# Patient Record
Sex: Male | Born: 1965
Health system: Southern US, Community
[De-identification: ages and names within clinical notes are randomized; demographics above are authoritative.]

## PROBLEM LIST (undated history)

## (undated) DIAGNOSIS — M25569 Pain in unspecified knee: Secondary | ICD-10-CM

## (undated) DIAGNOSIS — M25512 Pain in left shoulder: Secondary | ICD-10-CM

## (undated) DIAGNOSIS — G8929 Other chronic pain: Secondary | ICD-10-CM

## (undated) DIAGNOSIS — K219 Gastro-esophageal reflux disease without esophagitis: Secondary | ICD-10-CM

## (undated) DIAGNOSIS — E669 Obesity, unspecified: Secondary | ICD-10-CM

## (undated) HISTORY — DX: Obesity, unspecified: E66.9

## (undated) HISTORY — PX: OTHER SURGICAL HISTORY: SHX169

## (undated) HISTORY — DX: Gastro-esophageal reflux disease without esophagitis: K21.9

## (undated) HISTORY — PX: KNEE SURGERY: SHX244

---

## 1999-08-01 ENCOUNTER — Emergency Department (HOSPITAL_COMMUNITY): Admission: EM | Admit: 1999-08-01 | Discharge: 1999-08-02 | Payer: Self-pay | Admitting: *Deleted

## 2001-07-11 HISTORY — PX: CERVICAL DISC SURGERY: SHX588

## 2002-01-20 ENCOUNTER — Emergency Department (HOSPITAL_COMMUNITY): Admission: EM | Admit: 2002-01-20 | Discharge: 2002-01-20 | Payer: Self-pay | Admitting: Emergency Medicine

## 2002-01-23 ENCOUNTER — Ambulatory Visit (HOSPITAL_COMMUNITY): Admission: RE | Admit: 2002-01-23 | Discharge: 2002-01-23 | Payer: Self-pay | Admitting: Family Medicine

## 2002-01-23 ENCOUNTER — Encounter: Payer: Self-pay | Admitting: Family Medicine

## 2002-03-06 ENCOUNTER — Encounter: Payer: Self-pay | Admitting: Neurosurgery

## 2002-03-06 ENCOUNTER — Inpatient Hospital Stay (HOSPITAL_COMMUNITY): Admission: RE | Admit: 2002-03-06 | Discharge: 2002-03-07 | Payer: Self-pay | Admitting: Neurosurgery

## 2003-01-24 ENCOUNTER — Emergency Department (HOSPITAL_COMMUNITY): Admission: EM | Admit: 2003-01-24 | Discharge: 2003-01-25 | Payer: Self-pay | Admitting: Emergency Medicine

## 2003-01-27 ENCOUNTER — Ambulatory Visit (HOSPITAL_COMMUNITY): Admission: RE | Admit: 2003-01-27 | Discharge: 2003-01-27 | Payer: Self-pay | Admitting: *Deleted

## 2003-02-26 ENCOUNTER — Ambulatory Visit (HOSPITAL_COMMUNITY): Admission: RE | Admit: 2003-02-26 | Discharge: 2003-02-26 | Payer: Self-pay | Admitting: Family Medicine

## 2003-02-26 ENCOUNTER — Encounter: Payer: Self-pay | Admitting: Family Medicine

## 2003-05-01 ENCOUNTER — Ambulatory Visit (HOSPITAL_COMMUNITY): Admission: RE | Admit: 2003-05-01 | Discharge: 2003-05-01 | Payer: Self-pay | Admitting: Gastroenterology

## 2006-11-28 ENCOUNTER — Ambulatory Visit: Payer: Self-pay | Admitting: Family Medicine

## 2008-07-24 ENCOUNTER — Ambulatory Visit: Payer: Self-pay | Admitting: Internal Medicine

## 2008-07-24 DIAGNOSIS — Z87448 Personal history of other diseases of urinary system: Secondary | ICD-10-CM | POA: Insufficient documentation

## 2008-07-24 DIAGNOSIS — E669 Obesity, unspecified: Secondary | ICD-10-CM | POA: Insufficient documentation

## 2008-07-24 DIAGNOSIS — K219 Gastro-esophageal reflux disease without esophagitis: Secondary | ICD-10-CM | POA: Insufficient documentation

## 2008-07-24 LAB — CONVERTED CEMR LAB
Bilirubin Urine: NEGATIVE
Blood in Urine, dipstick: NEGATIVE
Glucose, Urine, Semiquant: NEGATIVE
Ketones, urine, test strip: NEGATIVE
Nitrite: NEGATIVE
Protein, U semiquant: NEGATIVE
Specific Gravity, Urine: >=1.03
Urobilinogen, UA: NEGATIVE
WBC Urine, dipstick: NEGATIVE
pH: 5

## 2010-04-12 ENCOUNTER — Encounter: Payer: Self-pay | Admitting: Internal Medicine

## 2010-04-26 ENCOUNTER — Ambulatory Visit: Payer: Self-pay | Admitting: Internal Medicine

## 2010-04-26 LAB — CONVERTED CEMR LAB
ALT: 27 units/L (ref 0–53)
AST: 21 units/L (ref 0–37)
Albumin: 4 g/dL (ref 3.5–5.2)
Alkaline Phosphatase: 58 units/L (ref 39–117)
BUN: 13 mg/dL (ref 6–23)
Basophils Absolute: 0 10*3/uL (ref 0.0–0.1)
Basophils Relative: 0.6 % (ref 0.0–3.0)
Bilirubin Urine: NEGATIVE
Bilirubin, Direct: 0.1 mg/dL (ref 0.0–0.3)
Blood in Urine, dipstick: NEGATIVE
CO2: 29 meq/L (ref 19–32)
Calcium: 9 mg/dL (ref 8.4–10.5)
Chloride: 105 meq/L (ref 96–112)
Cholesterol: 157 mg/dL (ref 0–200)
Creatinine, Ser: 0.9 mg/dL (ref 0.4–1.5)
Eosinophils Absolute: 0.1 10*3/uL (ref 0.0–0.7)
Eosinophils Relative: 1 % (ref 0.0–5.0)
GFR calc non Af Amer: 104.24 mL/min (ref >60)
Glucose, Bld: 85 mg/dL (ref 70–99)
Glucose, Urine, Semiquant: NEGATIVE
HCT: 41.9 % (ref 39.0–52.0)
HDL: 31 mg/dL — ABNORMAL LOW (ref >39.00)
Hemoglobin: 14.2 g/dL (ref 13.0–17.0)
Ketones, urine, test strip: NEGATIVE
LDL Cholesterol: 106 mg/dL — ABNORMAL HIGH (ref 0–99)
Lymphocytes Relative: 33.5 % (ref 12.0–46.0)
Lymphs Abs: 2.3 10*3/uL (ref 0.7–4.0)
MCHC: 33.9 g/dL (ref 30.0–36.0)
MCV: 87.3 fL (ref 78.0–100.0)
Monocytes Absolute: 0.6 10*3/uL (ref 0.1–1.0)
Monocytes Relative: 8.4 % (ref 3.0–12.0)
Neutro Abs: 4 10*3/uL (ref 1.4–7.7)
Neutrophils Relative %: 56.5 % (ref 43.0–77.0)
Nitrite: NEGATIVE
Platelets: 159 10*3/uL (ref 150.0–400.0)
Potassium: 4 meq/L (ref 3.5–5.1)
Protein, U semiquant: NEGATIVE
RBC: 4.8 M/uL (ref 4.22–5.81)
RDW: 14.3 % (ref 11.5–14.6)
Sodium: 140 meq/L (ref 135–145)
Specific Gravity, Urine: 1.02
TSH: 1.15 microintl units/mL (ref 0.35–5.50)
Total Bilirubin: 1 mg/dL (ref 0.3–1.2)
Total CHOL/HDL Ratio: 5
Total Protein: 6.8 g/dL (ref 6.0–8.3)
Triglycerides: 98 mg/dL (ref 0.0–149.0)
Urobilinogen, UA: 0.2
VLDL: 19.6 mg/dL (ref 0.0–40.0)
WBC Urine, dipstick: NEGATIVE
WBC: 7 10*3/uL (ref 4.5–10.5)
pH: 5.5

## 2010-05-10 ENCOUNTER — Ambulatory Visit: Payer: Self-pay | Admitting: Internal Medicine

## 2010-07-14 ENCOUNTER — Telehealth (INDEPENDENT_AMBULATORY_CARE_PROVIDER_SITE_OTHER): Payer: Self-pay | Admitting: *Deleted

## 2010-08-10 NOTE — Assessment & Plan Note (Signed)
Summary: CPX/CJR   Vital Signs:  Patient profile:   45 year old Shane [name] Height:      69 inches Weight:      275 pounds BMI:     40.76 Temp:     98.2 degrees F oral BP sitting:   134 / 76  (left arm) Cuff size:   large  Vitals Entered By: Alfred Levins, CMA (May 10, 2010 1:58 PM)  Contraindications/Deferment of Procedures/Staging:    Test/Procedure: FLU VAX    Reason for deferment: patient declined  CC: cpx   CC:  cpx.  History of Present Illness: 45 year old patient who is seen today for a wellness exam.  He is accompanied by his wife.  She describes him as a mild to moderate snore.  He denies any daytime sleepiness.  He does have a history of exogenous obesity  Current Medications (verified): 1)  None  Allergies (verified): No Known Drug Allergies  Past History:  Past Medical History: GERD history of hematuria exogenous obesity  Past Surgical History: cervical disk disease, status post surgery 2003 (nudelman) status post right carpal pummel repair  Family History: Reviewed history from 07/24/2008 and no changes required.  father, age 25.  History prostate cancer, and hypertension mother is 31, status post resection of a benign brain tumor status post CABG and status post aortic and mitral valve repair secondary to rheumatic heart disease one brother is in good health with a history of nephrolithiasis two sisters one died at childbirth  Social History: Reviewed history from 07/24/2008 and no changes required. Never Smoked 3 children Married  Review of Systems  The patient denies anorexia, fever, weight loss, weight gain, vision loss, decreased hearing, hoarseness, chest pain, syncope, dyspnea on exertion, peripheral edema, prolonged cough, headaches, hemoptysis, abdominal pain, melena, hematochezia, severe indigestion/heartburn, hematuria, incontinence, genital sores, muscle weakness, suspicious skin lesions, transient blindness, difficulty walking,  depression, unusual weight change, abnormal bleeding, enlarged lymph nodes, angioedema, breast masses, and testicular masses.    Physical Exam  General:  overweight-appearing.  130/76overweight-appearing.   Head:  Normocephalic and atraumatic without obvious abnormalities. No apparent alopecia or balding. Eyes:  No corneal or conjunctival inflammation noted. EOMI. Perrla. Funduscopic exam benign, without hemorrhages, exudates or papilledema. Vision grossly normal. Ears:  External ear exam shows no significant lesions or deformities.  Otoscopic examination reveals clear canals, tympanic membranes are intact bilaterally without bulging, retraction, inflammation or discharge. Hearing is grossly normal bilaterally. Nose:  External nasal examination shows no deformity or inflammation. Nasal mucosa are pink and moist without lesions or exudates. Mouth:  pharyngeal crowding and low-lying uvula.   Neck:  No deformities, masses, or tenderness noted. Chest Wall:  No deformities, masses, tenderness or gynecomastia noted. Breasts:  No masses or gynecomastia noted Lungs:  Normal respiratory effort, chest expands symmetrically. Lungs are clear to auscultation, no crackles or wheezes. Heart:  Normal rate and regular rhythm. S1 and S2 normal without gallop, murmur, click, rub or other extra sounds. Abdomen:  Bowel sounds positive,abdomen soft and non-tender without masses, organomegaly or hernias noted. Rectal:  No external abnormalities noted. Normal sphincter tone. No rectal masses or tenderness. Genitalia:  Testes bilaterally descended without nodularity, tenderness or masses. No scrotal masses or lesions. No penis lesions or urethral discharge. Prostate:  Prostate gland firm and smooth, no enlargement, nodularity, tenderness, mass, asymmetry or induration. Msk:  No deformity or scoliosis noted of thoracic or lumbar spine.   Pulses:  R and L carotid,radial,femoral,dorsalis pedis and posterior tibial pulses  are  full and equal bilaterally Extremities:  No clubbing, cyanosis, edema, or deformity noted with normal full range of motion of all joints.   Neurologic:  No cranial nerve deficits noted. Station and gait are normal. Plantar reflexes are down-going bilaterally. DTRs are symmetrical throughout. Sensory, motor and coordinative functions appear intact. Skin:  Intact without suspicious lesions or rashes Cervical Nodes:  No lymphadenopathy noted Axillary Nodes:  No palpable lymphadenopathy Inguinal Nodes:  No significant adenopathy Psych:  Cognition and judgment appear intact. Alert and cooperative with normal attention span and concentration. No apparent delusions, illusions, hallucinations   Impression & Recommendations:  Problem # 1:  PREVENTIVE HEALTH CARE (ICD-V70.0)  Patient Instructions: 1)  Please schedule a follow-up appointment in 1 year. 2)  Limit your Sodium (Salt). 3)  It is important that you exercise regularly at least 20 minutes 5 times a week. If you develop chest pain, have severe difficulty breathing, or feel very tired , stop exercising immediately and seek medical attention. 4)  You need to lose weight. Consider a lower calorie diet and regular exercise.    Orders Added: 1)  Est. Patient 40-64 years [99396]     Appended Document: CPX/CJR    Nurse Visit   Allergies: No Known Drug Allergies Laboratory Results   Urine Tests  Date/Time Received: May 10, 2010 3:09 PM  Routine Urinalysis   Color: yellow Appearance: Clear Glucose: negative   (Normal Range: Negative) Bilirubin: negative   (Normal Range: Negative) Ketone: negative   (Normal Range: Negative) Spec. Gravity: >=1.030   (Normal Range: 1.003-1.035) Blood: trace-intact   (Normal Range: Negative) pH: 6.0   (Normal Range: 5.0-8.0) Protein: negative   (Normal Range: Negative) Urobilinogen: 0.2   (Normal Range: 0-1) Nitrite: negative   (Normal Range: Negative) Leukocyte Esterace: negative    (Normal Range: Negative)    Comments: Alfred Levins, CMA  May 10, 2010 3:09 PM    Orders Added: 1)  UA Dipstick w/o Micro (manual) [81002]

## 2010-08-12 NOTE — Letter (Signed)
Summary: Medical Exam for Scientist, research (life sciences) Exam for [employer]   Imported By: Maryln Gottron 07/21/2010 12:25:35  _____________________________________________________________________  External Attachment:    Type:   Image     Comment:   External Document

## 2010-09-01 NOTE — Progress Notes (Signed)
Summary: Phone call completed ,call pt and left a massage.  Phone Note Call from Patient Call back at Home Phone [phone]   Caller: spouse - Adriene Summary of Call: Pt dropped by a cpx form last week re: cpx he had done on 05/10/10,  that needed to be completed by Dr. Amador Cunas. Need to check on status of form. Pls call when ready for pick up.  Initial call taken by: Lucy Antigua,  July 14, 2010 3:06 PM  Follow-up for Phone Call        I dont recall seeing these forms? - in [place] on your chair yesterday?   Follow-up by: Duard Brady LPN,  July 15, 2010 9:24 AM  Additional Follow-up for Phone Call Additional follow up Details #1::        i will complerte forms Additional Follow-up by: Gordy Savers  MD,  July 15, 2010 12:33 PM

## 2010-11-26 NOTE — Op Note (Signed)
NAME:  Shane [name], Shane [name]                       ACCOUNT NO.:  [id number]   MEDICAL RECORD NO.:  [id number]                   PATIENT TYPE:  INP   LOCATION:  3008                                 FACILITY:  [facility]   PHYSICIAN:  Hewitt Shorts, M.D.            DATE OF BIRTH:  [birth year]   DATE OF PROCEDURE:  03/06/2002  DATE OF DISCHARGE:  03/07/2002                                 OPERATIVE REPORT   PREOPERATIVE DIAGNOSES:  1. Bilateral carpal tunnel syndrome, right worse than left.  2. Cervical disk herniation, cervical spondylosis and cervical degenerative     disk disease.   POSTOPERATIVE DIAGNOSES:  1. Bilateral carpal tunnel syndrome, right worse than left.  2. Cervical disk herniation, cervical spondylosis and cervical degenerative     disk disease.   OPERATION PERFORMED:  1. Right carpal tunnel release.  2. C5-6 anterior cervical diskectomy and arthrodesis with iliac crest     allograft and tether cervical plating.   SURGEON:  Hewitt Shorts, M.D.   ASSISTANT:  Clydene Fake, M.D.   ANESTHESIA:  General endotracheal.   INDICATIONS FOR PROCEDURE:  The patient is a 45 year old man who presented  with lumbar radiculopathy with bilateral numbness and tingling in the hand,  right worse than left.  He was found on EMG and nerve conduction studies to  have evidence of bilateral carpal tunnel syndrome, right worse than left.  MRI scan of the cervical spine showed a moderately large central to right C5-  6 cervical disk herniation.  Decision was made to proceed with right carpal  tunnel release and C5-6 anterior cervical diskectomy and arthrodesis.   DESCRIPTION OF PROCEDURE:  The patient was brought to the operating room and  placed under general endotracheal anesthesia.  The distal right upper  extremity was prepped with Betadine soap and solution and draped in sterile  fashion and then an incision was made in the proximal right palm medial to  the thenar crease.   Dissection was carried down through the subcutaneous  tissue.  Bipolar cautery was used to maintain hemostasis.  Dissection was  carried down to the transverse carpal ligament which was divided from its  distal to proximal extend with decompression of the underlying neural  structures.  Once the ligament was fully opened, it was felt that good  relaxation and decompression of the carpal tunnel had been achieved.  The  wound was checked for hemostasis which was established with the use of  bipolar cautery and then the subcutaneous tissue was approximated with  inverted interrupted 2-0 undyed Vicryl sutures.  The skin was reapproximated  with interrupted mattress sutures using a 4-0 nylon.  The wound was dressed  with Adaptic gauze fluffs and wrapped with a Kling.  The patient was then  positioned for the anterior cervical diskectomy and fusion.  He was placed  in 10 pounds of halter traction and then the neck was prepped with  Betadine  soap and solution and draped in a sterile fashion.  A horizontal incision  was made on the left side of the neck.  The line of the incision was  infiltrated with local anesthetic with epinephrine prior to skin incision.  Incision was made with a Shaw scalpel with a temperature of 120.  Dissection  was carried down to the subcutaneous tissues and platysma.  Dissection was  then carried down to the avascular plane, leaving the sternocleidomastoid,  carotid artery and jugular vein laterally and trachea and esophagus  medially.  The ventral aspect of the vertebral column was identified and a  localizing x-ray was taken and the C5-6 intervertebral disk space  identified.  Diskectomy was begun with incision in the annulus and continued  with microcurets and pituitary rongeurs.  The cartilaginous end plates and  the corresponding vertebrae were removed using microcurets as well as the  Micro Max drill.  The operating microscope was draped and brought into the  field  to provide additional magnification, illumination and visualization  and the remainder of the procedure was performed using microdissection and  microsurgical technique.  Posterior osteophytic overgrowth was removed using  the Micro Max drill as well as the 2 mm Kerrison punch with a thin foot  plate.  Foraminotomy was performed bilaterally and the posterior  longitudinal ligament was carefully removed.  The disk herniation which was  located centrally and to the right was removed and we were able to thereby  decompress the thecal sac and the C6 nerve roots bilaterally decompressing  the spinal canal and the foramina.  Once the diskectomy was completed and  hemostasis was established with the use of Gelfoam soaked in thrombin, we  selected a wedge of iliac crest allograft.  It was cut and shaped and  positioned in the intervertebral disk space and countersunk.  We then  selected a 16 mm tether cervical plate.  The cervical traction  was  discontinued.  The plate was positioned over the fusion construct and  secured to each of the vertebrae with a pair of 4.0 x 15 mm screws.  Each  screw hole was drilled and tapped and the screw placed.  All four screws  were placed in an alternating fashion and then final tightening was done.  An x-ray was taken which showed the graft in good position.  The plate was  in good position.  The screws at C5 were in good position.  We could not  visualize the C6 screws due to the patient's large shoulders but under  direct visualization within the operative field they appeared in good  position.  The wound was irrigated with bacitracin solution and checked for  hemostasis which was established and confirmed and then we proceeded with  closure.  The platysma was closed with inverted interrupted 2-0 undyed  Vicryl sutures, the subcutaneous and subcuticular layer were closed with inverted interrupted 3-0 undyed Vicryl sutures and the skin was approximated  with  Dermabond.  The patient tolerated the procedure well.  The estimated  blood loss for this procedure was 100 cc.  Sponge, needle and instrument  counts were correct.  Following surgery, the patient was placed in a soft  cervical collar to be reversed from anesthetic, extubated and transferred to  the recovery room for further care.  Hewitt Shorts, M.D.    RWN/MEDQ  D:  03/06/2002  T:  03/09/2002  Job:  [id number]

## 2011-06-24 ENCOUNTER — Encounter: Payer: Self-pay | Admitting: Internal Medicine

## 2011-06-24 ENCOUNTER — Ambulatory Visit (INDEPENDENT_AMBULATORY_CARE_PROVIDER_SITE_OTHER): Payer: Managed Care, Other (non HMO) | Admitting: Internal Medicine

## 2011-06-24 DIAGNOSIS — M25561 Pain in right knee: Secondary | ICD-10-CM

## 2011-06-24 DIAGNOSIS — M25569 Pain in unspecified knee: Secondary | ICD-10-CM

## 2011-06-24 DIAGNOSIS — E669 Obesity, unspecified: Secondary | ICD-10-CM

## 2011-06-24 NOTE — Patient Instructions (Signed)
DUEXIS one twice daily   Call or return to clinic prn if these symptoms worsen or fail to improve as anticipated.

## 2011-06-24 NOTE — Progress Notes (Signed)
  Subjective:    Patient ID: Shane [name], male    DOB: [birth year], 45 y.o.   MRN: [id number]  HPI 45 year old patient who presents with a 3 week history of atraumatic pain and swelling involving the right knee. It has slowly improved over this period of time. No prior history of acute arthritis or history of gout. No prior injury to the right knee    Review of Systems  Constitutional: Negative for fever, chills, appetite change and fatigue.  HENT: Negative for hearing loss, ear pain, congestion, sore throat, trouble swallowing, neck stiffness, dental problem, voice change and tinnitus.   Eyes: Negative for pain, discharge and visual disturbance.  Respiratory: Negative for cough, chest tightness, wheezing and stridor.   Cardiovascular: Negative for chest pain, palpitations and leg swelling.  Gastrointestinal: Negative for nausea, vomiting, abdominal pain, diarrhea, constipation, blood in stool and abdominal distention.  Genitourinary: Negative for urgency, hematuria, flank pain, discharge, difficulty urinating and genital sores.  Musculoskeletal: Positive for joint swelling. Negative for myalgias, back pain, arthralgias and gait problem.  Skin: Negative for rash.  Neurological: Negative for dizziness, syncope, speech difficulty, weakness, numbness and headaches.  Hematological: Negative for adenopathy. Does not bruise/bleed easily.  Psychiatric/Behavioral: Negative for behavioral problems and dysphoric mood. The patient is not nervous/anxious.        Objective:   Physical Exam  Constitutional: He appears well-developed and well-nourished. No distress.  Musculoskeletal:       Right knee is warm to touch and has a mild effusion; Mild tenderness along the medial joint line          Assessment & Plan:   Right knee pain possible degenerated meniscus. Seems to be improving. No history of gout. Will treat with ibuprofen 800 mg 3 times daily and clinically observe;  if he fails to  improve or relapses we'll set up for orthopedic referral

## 2011-07-06 ENCOUNTER — Telehealth: Payer: Self-pay | Admitting: Internal Medicine

## 2011-07-06 DIAGNOSIS — M25569 Pain in unspecified knee: Secondary | ICD-10-CM

## 2011-07-06 DIAGNOSIS — G8929 Other chronic pain: Secondary | ICD-10-CM

## 2011-07-06 NOTE — Telephone Encounter (Signed)
Pt is still experiencing knee pain and is requesting to be referred to a orthopedic . Pt is off on fridays and is requesting to be scheduled on a friday

## 2011-07-07 NOTE — Telephone Encounter (Signed)
ok 

## 2011-07-07 NOTE — Telephone Encounter (Signed)
Spoke with pt - informed ok for referral - terri will call once set up

## 2011-09-01 ENCOUNTER — Ambulatory Visit (INDEPENDENT_AMBULATORY_CARE_PROVIDER_SITE_OTHER): Payer: BC Managed Care – PPO | Admitting: Internal Medicine

## 2011-09-01 ENCOUNTER — Telehealth: Payer: Self-pay | Admitting: *Deleted

## 2011-09-01 ENCOUNTER — Ambulatory Visit: Payer: Managed Care, Other (non HMO) | Admitting: Internal Medicine

## 2011-09-01 ENCOUNTER — Encounter: Payer: Self-pay | Admitting: Internal Medicine

## 2011-09-01 VITALS — BP 140/80 | Temp 97.5°F | Wt 272.0 lb

## 2011-09-01 DIAGNOSIS — J069 Acute upper respiratory infection, unspecified: Secondary | ICD-10-CM

## 2011-09-01 NOTE — Patient Instructions (Signed)
Nasonex use daily Mucinex DM one twice daily  Get plenty of rest, Drink lots of  clear liquids, and use Tylenol or ibuprofen for fever and discomfort.

## 2011-09-01 NOTE — Progress Notes (Signed)
  Subjective:    Patient ID: Shane [name], male    DOB: Jan 27, 1966, 46 y.o.   MRN: [id number]  HPI  46 year old patient who presents with a one-week history of sinus congestion mild headache. He has been using Alka-Seltzer plus with minimal benefit. There's been no fever or purulent drainage or localized sinus pain. No dental pain.    Review of Systems  Constitutional: Negative for fever, chills, appetite change and fatigue.  HENT: Positive for congestion and rhinorrhea. Negative for hearing loss, ear pain, sore throat, trouble swallowing, neck stiffness, dental problem, voice change and tinnitus.   Eyes: Negative for pain, discharge and visual disturbance.  Respiratory: Negative for cough, chest tightness, wheezing and stridor.   Cardiovascular: Negative for chest pain, palpitations and leg swelling.  Gastrointestinal: Negative for nausea, vomiting, abdominal pain, diarrhea, constipation, blood in stool and abdominal distention.  Genitourinary: Negative for urgency, hematuria, flank pain, discharge, difficulty urinating and genital sores.  Musculoskeletal: Negative for myalgias, back pain, joint swelling, arthralgias and gait problem.  Skin: Negative for rash.  Neurological: Positive for headaches. Negative for dizziness, syncope, speech difficulty, weakness and numbness.  Hematological: Negative for adenopathy. Does not bruise/bleed easily.  Psychiatric/Behavioral: Negative for behavioral problems and dysphoric mood. The patient is not nervous/anxious.        Objective:   Physical Exam  Constitutional: He is oriented to person, place, and time. He appears well-developed.       Overweight. Normal blood pressure. Afebrile.  HENT:  Head: Normocephalic.  Right Ear: External ear normal.  Left Ear: External ear normal.       Low hanging soft palate with pharyngeal crowding  Eyes: Conjunctivae and EOM are normal.  Neck: Normal range of motion.  Cardiovascular: Normal rate and normal  heart sounds.   Pulmonary/Chest: Breath sounds normal.  Abdominal: Bowel sounds are normal.  Musculoskeletal: Normal range of motion. He exhibits no edema and no tenderness.  Neurological: He is alert and oriented to person, place, and time.  Psychiatric: He has a normal mood and affect. His behavior is normal.          Assessment & Plan:   Sinus congestion secondary to viral URI. Will treat with samples of Nasonex also treat symptomatically with decongestants and expectorants

## 2011-09-01 NOTE — Telephone Encounter (Signed)
Appt made with Dr. [name] Bouchard for ? Bronchitis?

## 2011-09-22 ENCOUNTER — Ambulatory Visit: Payer: BC Managed Care – PPO | Attending: Specialist | Admitting: Physical Therapy

## 2011-09-22 DIAGNOSIS — IMO0001 Reserved for inherently not codable concepts without codable children: Secondary | ICD-10-CM | POA: Insufficient documentation

## 2011-09-22 DIAGNOSIS — R5381 Other malaise: Secondary | ICD-10-CM | POA: Insufficient documentation

## 2011-09-22 DIAGNOSIS — M25669 Stiffness of unspecified knee, not elsewhere classified: Secondary | ICD-10-CM | POA: Insufficient documentation

## 2011-09-22 DIAGNOSIS — M25569 Pain in unspecified knee: Secondary | ICD-10-CM | POA: Insufficient documentation

## 2011-09-27 ENCOUNTER — Ambulatory Visit: Payer: BC Managed Care – PPO | Admitting: Physical Therapy

## 2011-09-30 ENCOUNTER — Ambulatory Visit: Payer: BC Managed Care – PPO | Admitting: Physical Therapy

## 2011-10-04 ENCOUNTER — Ambulatory Visit: Payer: BC Managed Care – PPO | Admitting: Physical Therapy

## 2011-10-06 ENCOUNTER — Ambulatory Visit: Payer: BC Managed Care – PPO | Admitting: *Deleted

## 2011-10-11 ENCOUNTER — Ambulatory Visit: Payer: BC Managed Care – PPO | Attending: Specialist | Admitting: *Deleted

## 2011-10-11 DIAGNOSIS — IMO0001 Reserved for inherently not codable concepts without codable children: Secondary | ICD-10-CM | POA: Insufficient documentation

## 2011-10-11 DIAGNOSIS — M25569 Pain in unspecified knee: Secondary | ICD-10-CM | POA: Insufficient documentation

## 2011-10-11 DIAGNOSIS — M25669 Stiffness of unspecified knee, not elsewhere classified: Secondary | ICD-10-CM | POA: Insufficient documentation

## 2011-10-11 DIAGNOSIS — R5381 Other malaise: Secondary | ICD-10-CM | POA: Insufficient documentation

## 2011-10-18 ENCOUNTER — Ambulatory Visit: Payer: BC Managed Care – PPO | Admitting: *Deleted

## 2011-10-20 ENCOUNTER — Ambulatory Visit: Payer: BC Managed Care – PPO | Admitting: *Deleted

## 2011-10-24 ENCOUNTER — Ambulatory Visit: Payer: BC Managed Care – PPO | Admitting: Physical Therapy

## 2011-10-27 ENCOUNTER — Ambulatory Visit: Payer: BC Managed Care – PPO | Admitting: *Deleted

## 2011-11-01 ENCOUNTER — Ambulatory Visit: Payer: BC Managed Care – PPO | Admitting: Physical Therapy

## 2011-11-03 ENCOUNTER — Ambulatory Visit: Payer: BC Managed Care – PPO | Admitting: *Deleted

## 2011-11-08 ENCOUNTER — Ambulatory Visit: Payer: BC Managed Care – PPO | Admitting: Physical Therapy

## 2011-11-10 ENCOUNTER — Ambulatory Visit: Payer: BC Managed Care – PPO | Attending: Specialist | Admitting: *Deleted

## 2011-11-10 DIAGNOSIS — IMO0001 Reserved for inherently not codable concepts without codable children: Secondary | ICD-10-CM | POA: Insufficient documentation

## 2011-11-10 DIAGNOSIS — R5381 Other malaise: Secondary | ICD-10-CM | POA: Insufficient documentation

## 2011-11-10 DIAGNOSIS — M25669 Stiffness of unspecified knee, not elsewhere classified: Secondary | ICD-10-CM | POA: Insufficient documentation

## 2011-11-10 DIAGNOSIS — M25569 Pain in unspecified knee: Secondary | ICD-10-CM | POA: Insufficient documentation

## 2011-11-15 ENCOUNTER — Ambulatory Visit: Payer: BC Managed Care – PPO | Admitting: *Deleted

## 2011-11-17 ENCOUNTER — Ambulatory Visit: Payer: BC Managed Care – PPO | Admitting: *Deleted

## 2011-11-22 ENCOUNTER — Ambulatory Visit: Payer: BC Managed Care – PPO | Admitting: *Deleted

## 2011-11-25 ENCOUNTER — Ambulatory Visit: Payer: BC Managed Care – PPO | Admitting: Physical Therapy

## 2011-11-29 ENCOUNTER — Ambulatory Visit: Payer: BC Managed Care – PPO | Admitting: *Deleted

## 2011-12-01 ENCOUNTER — Ambulatory Visit: Payer: BC Managed Care – PPO | Admitting: *Deleted

## 2011-12-07 ENCOUNTER — Ambulatory Visit: Payer: BC Managed Care – PPO | Admitting: Physical Therapy

## 2011-12-14 ENCOUNTER — Ambulatory Visit: Payer: BC Managed Care – PPO | Attending: Specialist | Admitting: Physical Therapy

## 2011-12-14 DIAGNOSIS — M25569 Pain in unspecified knee: Secondary | ICD-10-CM | POA: Insufficient documentation

## 2011-12-14 DIAGNOSIS — M25669 Stiffness of unspecified knee, not elsewhere classified: Secondary | ICD-10-CM | POA: Insufficient documentation

## 2011-12-14 DIAGNOSIS — IMO0001 Reserved for inherently not codable concepts without codable children: Secondary | ICD-10-CM | POA: Insufficient documentation

## 2011-12-14 DIAGNOSIS — R5381 Other malaise: Secondary | ICD-10-CM | POA: Insufficient documentation

## 2011-12-16 ENCOUNTER — Ambulatory Visit: Payer: BC Managed Care – PPO | Admitting: Physical Therapy

## 2011-12-20 ENCOUNTER — Ambulatory Visit: Payer: BC Managed Care – PPO | Admitting: *Deleted

## 2011-12-22 ENCOUNTER — Ambulatory Visit: Payer: BC Managed Care – PPO | Admitting: *Deleted

## 2012-01-17 ENCOUNTER — Encounter: Payer: Self-pay | Admitting: Internal Medicine

## 2012-01-17 ENCOUNTER — Ambulatory Visit (INDEPENDENT_AMBULATORY_CARE_PROVIDER_SITE_OTHER): Payer: BC Managed Care – PPO | Admitting: Internal Medicine

## 2012-01-17 ENCOUNTER — Telehealth: Payer: Self-pay | Admitting: Internal Medicine

## 2012-01-17 VITALS — BP 130/84 | HR 107 | Temp 99.0°F | Wt 258.0 lb

## 2012-01-17 DIAGNOSIS — K5289 Other specified noninfective gastroenteritis and colitis: Secondary | ICD-10-CM

## 2012-01-17 DIAGNOSIS — E860 Dehydration: Secondary | ICD-10-CM | POA: Insufficient documentation

## 2012-01-17 DIAGNOSIS — K529 Noninfective gastroenteritis and colitis, unspecified: Secondary | ICD-10-CM

## 2012-01-17 DIAGNOSIS — R197 Diarrhea, unspecified: Secondary | ICD-10-CM

## 2012-01-17 MED ORDER — PROMETHAZINE HCL 25 MG PO TABS: 25.0000 mg | ORAL_TABLET | Freq: Four times a day (QID) | ORAL | Status: DC | PRN

## 2012-01-17 NOTE — Telephone Encounter (Signed)
Caller: Adriene/Spouse; PCP: Eleonore Chiquito; CB#: [phone] ; Call regarding Vomiting and Diarrhea;  Wife calling regarding pt c/o N/V/D. Onset 7/7 am. Everyone in the house has had it on and off all weekend. Afebrile. Not trx. Pt wishes to be seen. Disp: See provider w/in 24 hrs per Nausea or Vomiting protocol. Care advice given. Appt made with Dr. Fabian Sharp for 1400, 7/9. Dr. [name] Bouchard had no appt's available.

## 2012-01-17 NOTE — Patient Instructions (Addendum)
This seems like acute gastroenteritis and still could be viral.  Agree with hydration efforts avoid caffeine high sugar foods and drinks. Gatorade or Pedialyte is good for hydration  Can try Imodium over-the-counter as directed to help with symptoms.   antinausea medicine if needed he could make you drowsy. Expect improvement in the next 2-3 days although your bowel can be irritable for up to 2 weeks.  No work for today and tomorrow  Thursday or Friday if  Not improved.    Diarrhea Infections caused by germs (bacterial) or a virus commonly cause diarrhea. Your caregiver has determined that with time, rest and fluids, the diarrhea should improve. In general, eat normally while drinking more water than usual. Although water may prevent dehydration, it does not contain salt and minerals (electrolytes). Broths, weak tea without caffeine and oral rehydration solutions (ORS) replace fluids and electrolytes. Small amounts of fluids should be taken frequently. Large amounts at one time may not be tolerated. Plain water may be harmful in infants and the elderly. Oral rehydrating solutions (ORS) are available at pharmacies and grocery stores. ORS replace water and important electrolytes in proper proportions. Sports drinks are not as effective as ORS and may be harmful due to sugars worsening diarrhea.  ORS is especially recommended for use in children with diarrhea. As a general guideline for children, replace any new fluid losses from diarrhea and/or vomiting with ORS as follows:   If your child weighs 22 pounds or under (10 kg or less), give 60-120 mL ( -  cup or 2 - 4 ounces) of ORS for each episode of diarrheal stool or vomiting episode.   If your child weighs more than 22 pounds (more than 10 kgs), give 120-240 mL ( - 1 cup or 4 - 8 ounces) of ORS for each diarrheal stool or episode of vomiting.   While correcting for dehydration, children should eat normally. However, foods high in sugar  should be avoided because this may worsen diarrhea. Large amounts of carbonated soft drinks, juice, gelatin desserts and other highly sugared drinks should be avoided.   After correction of dehydration, other liquids that are appealing to the child may be added. Children should drink small amounts of fluids frequently and fluids should be increased as tolerated. Children should drink enough fluids to keep urine clear or pale yellow.   Adults should eat normally while drinking more fluids than usual. Drink small amounts of fluids frequently and increase as tolerated. Drink enough fluids to keep urine clear or pale yellow. Broths, weak decaffeinated tea, lemon lime soft drinks (allowed to go flat) and ORS replace fluids and electrolytes.   Avoid:   Carbonated drinks.   Juice.   Extremely hot or cold fluids.   Caffeine drinks.   Fatty, greasy foods.   Alcohol.   Tobacco.   Too much intake of anything at one time.   Gelatin desserts.   Probiotics are active cultures of beneficial bacteria. They may lessen the amount and number of diarrheal stools in adults. Probiotics can be found in yogurt with active cultures and in supplements.   Wash hands well to avoid spreading bacteria and virus.   Anti-diarrheal medications are not recommended for infants and children.   Only take over-the-counter or prescription medicines for pain, discomfort or fever as directed by your caregiver. Do not give aspirin to children because it may cause Reye's Syndrome.   For adults, ask your caregiver if you should continue all prescribed and over-the-counter medicines.  If your caregiver has given you a follow-up appointment, it is very important to keep that appointment. Not keeping the appointment could result in a chronic or permanent injury, and disability. If there is any problem keeping the appointment, you must call back to this facility for assistance.  SEEK IMMEDIATE MEDICAL CARE IF:   You or your  child is unable to keep fluids down or other symptoms or problems become worse in spite of treatment.   Vomiting or diarrhea develops and becomes persistent.   There is vomiting of blood or bile (green material).   There is blood in the stool or the stools are black and tarry.   There is no urine output in 6-8 hours or there is only a small amount of very dark urine.   Abdominal pain develops, increases or localizes.   You have a fever.   Your baby is older than 3 months with a rectal temperature of 102 F (38.9 C) or higher.   Your baby is 88 months old or younger with a rectal temperature of 100.4 F (38 C) or higher.   You or your child develops excessive weakness, dizziness, fainting or extreme thirst.   You or your child develops a rash, stiff neck, severe headache or become irritable or sleepy and difficult to awaken.  MAKE SURE YOU:   Understand these instructions.   Will watch your condition.   Will get help right away if you are not doing well or get worse.  Document Released: 06/17/2002 Document Revised: 06/16/2011 Document Reviewed: 05/04/2009 [facility] Patient Information 2012 [place], [place].

## 2012-01-17 NOTE — Progress Notes (Signed)
  Subjective:    Patient ID: Shane [name], male    DOB: 07/21/1965, 46 y.o.   MRN: [id number]  HPI Patient comes in today for SDA for  new problem evaluation. Daily onset of nausea and diarrhea about 48 hours ago he describes it as severe nausea with minimal vomiting but very frequent clear watery to yellow stools without associated blood. Some cramps intermittently but no severe abdominal pain. His stool frequency is down some but was up last night about every hour to last stool was 10 AM this morning he is limiting his intake to decrease the potential for stool. Has severe nausea but no vomiting. He has 4 children at home one had vomiting one day his wife had some vomiting and loose stools lasting one day much better. He has no other exposures unusual travel unusual pets works for [employer] and is outside a good bit. Decreased urine output no UTI symptoms hasn't taken any medication for this. Was able to keep down 2 frozen Popsicles without difficulty. Tried some of his children's Pedialyte .  no history of significant GI disease no regular medications. No significant alcohol caffeine ,  Review of Systems No fever or chills syncope bleeding unusual headaches vision changes. Rest as per history of present illness  No outpatient encounter prescriptions on file as of 01/17/2012.   household of 5 wife age 97 children ages 66 25,8,3      Objective:   Physical Exam BP 130/84  Pulse 107  Temp 99 F (37.2 C) (Oral)  Wt 258 lb (117.028 kg)  SpO2 98% Wt Readings from Last 3 Encounters:  01/17/12 258 lb (117.028 kg)  09/01/11 272 lb (123.378 kg)  06/24/11 276 lb (125.193 kg)  pulse 88 regular Well-developed well-nourished gentleman in no acute distress looks a bit draggy gallops but nontoxic.    skin turgor is normal with no icterus unusual rashes. HEENT: Normocephalic ;atraumatic , Eyes;  PERRL, lids and conjunctiva clear,,Ears: no deformities, canals nl, TM landmarks normal, Nose: no  deformity or discharge  Mouth : OP clear without lesion or edema . Mucous membranes are moist. Lips might be slightly dry. Chest:  Clear to A&P without wheezes rales or rhonchi CV:  S1-S2 no gallops or murmurs peripheral perfusion is normal Abdomen:  Sof,t normal bowel sounds increase  without hepatosplenomegaly, no guarding rebound or masses no CVA tenderness no psoas sign Neuro non focal.      Assessment & Plan:    acute diarrheal illness; sounds infectious probably viral with some family members with symptoms. However he has  more severe symptoms. He is 48 hours into illness.   Note for work excuse because of his diarrhea it appears to be safe to try the Imodium discuss this with him prescription given for Phenergan in case he needs it for nausea vomiting although I think his major symptom complex is diarrheal.  Expectant management increase hydration by mouth intake. He appears to be limiting at to avoid the diarrhea.  Orders put in for stool culture Giardia cdiff because of severity of his symptoms. However would not use an antibiotic at this time. Treatment and fu depending on course supportive care at present.

## 2012-01-19 ENCOUNTER — Telehealth: Payer: Self-pay | Admitting: Internal Medicine

## 2012-01-19 NOTE — Telephone Encounter (Signed)
Patient's spouse called stating that her husband received a note out of work at his visit on Tuesday and is still not feeling well and would like to have another note to go back on Monday instead. Please advise.

## 2012-01-20 ENCOUNTER — Encounter: Payer: Self-pay | Admitting: Family Medicine

## 2012-01-20 NOTE — Telephone Encounter (Signed)
Notified that note will be left upfront for pickup.

## 2012-01-20 NOTE — Telephone Encounter (Signed)
Pt seen for vomiting and diarrhea.  Please advise.

## 2012-01-20 NOTE — Telephone Encounter (Signed)
Please extend his note to return to Monday.  Tell him his culture test are not yet back yet but negative so far.

## 2012-01-22 LAB — STOOL CULTURE

## 2012-09-24 ENCOUNTER — Telehealth: Payer: Self-pay | Admitting: Internal Medicine

## 2012-09-25 NOTE — Telephone Encounter (Signed)
Triage Call Report Triage Record Num: [id number] Operator: Levora Angel Patient Name: Shane [name] Call Date & Time: 09/24/2012 5:18:36PM Patient Phone: [phone] PCP: Gordy Savers Patient Gender: Male PCP Fax : [phone] Patient DOB: [birth year] [facility]  Reason for Call: Caller: Adriene/Spouse; PCP: Eleonore Chiquito (Family Practice); CB#: [phone]; Call regarding Vomiting/Diarrhea; Afebrile Onset: 09/23/12 Headache and nausea/Diarrhea 09/24/12 Vomiting x 3-4 episodes; Loose stools x5. All emergent symptoms per Nausea or Vomiting protocol ruled out with exception of "New onset of 3-4 episodes vomiting or diarrhea following mild abdominal cramping". Disposition Home Care. Home care advice Given.  Protocol(s) Used: Nausea or Vomiting Recommended Outcome per Protocol: Provide Home/Self Care Reason for Outcome: New onset of 3-4 episodes vomiting or diarrhea following mild abdominal cramping Care Advice: Antidiarrheal medications are usually unnecessary. Use only after consulting your provider. Application of A&D ointment or witch hazel medicated pads may help relieve anal irritation. ~ Call provider immediately if develop severe pain, black, tarry stools, bloody stools, blood-streaked or coffee ground-looking vomitus, or abdomen swollen. ~ ~ SYMPTOM / CONDITION MANAGEMENT Vomiting and Diarrhea Care: - Do not eat solid foods until vomiting subsides. - Begin taking fluids by sucking on ice chips or popsicles or taking sips of cool, clear fluids (soda, fruit juices that are low acid, sports drinks or nonprescription oral rehydration solution). - Gradually drink larger amounts of these fluids so that you are drinking six to eight 8 oz. (1.2 to 1.6 liters) of fluids a day. - Keep activity to a minimum. - Once vomiting and diarrhea subside, eat smaller, more frequent meals of easily digested foods such as crackers, toast, bananas, rice,  cooked cereal, applesauce, broth, baked or mashed potatoes, chicken or [place] without skin. Eat slowly. - Take fluids 30 minutes before or 60 minutes after meals. - Avoid high fat, highly seasoned, high fiber or high sugar content foods. - Avoid extremely hot or cold foods. - Do not take pain medication (such as aspirin, NSAIDs) while nauseated or vomiting. - Do not drink caffeinated or alcoholic beverages. ~ Go to the ED if you have developed signs and symptoms of dehydration such as very dry mouth and tongue; increased pulse rate at rest; no urine output for 12 hours or more; increasing weakness or drowsiness, or lightheadedness when trying to sit upright or standing.

## 2013-01-07 ENCOUNTER — Ambulatory Visit (INDEPENDENT_AMBULATORY_CARE_PROVIDER_SITE_OTHER): Payer: BC Managed Care – PPO | Admitting: Internal Medicine

## 2013-01-07 ENCOUNTER — Encounter: Payer: Self-pay | Admitting: Internal Medicine

## 2013-01-07 ENCOUNTER — Other Ambulatory Visit (INDEPENDENT_AMBULATORY_CARE_PROVIDER_SITE_OTHER): Payer: BC Managed Care – PPO

## 2013-01-07 VITALS — BP 140/76 | HR 74 | Temp 98.5°F | Resp 20 | Wt 271.0 lb

## 2013-01-07 DIAGNOSIS — Z0000 Encounter for general adult medical examination without abnormal findings: Secondary | ICD-10-CM

## 2013-01-07 DIAGNOSIS — M65839 Other synovitis and tenosynovitis, unspecified forearm: Secondary | ICD-10-CM

## 2013-01-07 DIAGNOSIS — M778 Other enthesopathies, not elsewhere classified: Secondary | ICD-10-CM

## 2013-01-07 LAB — CBC WITH DIFFERENTIAL/PLATELET
Basophils Absolute: 0 10*3/uL (ref 0.0–0.1)
Basophils Relative: 0.4 % (ref 0.0–3.0)
Eosinophils Absolute: 0.1 10*3/uL (ref 0.0–0.7)
HCT: 43.8 % (ref 39.0–52.0)
Hemoglobin: 14.8 g/dL (ref 13.0–17.0)
Lymphocytes Relative: 30.6 % (ref 12.0–46.0)
Lymphs Abs: 2.2 10*3/uL (ref 0.7–4.0)
MCHC: 33.7 g/dL (ref 30.0–36.0)
MCV: 86.3 fl (ref 78.0–100.0)
Monocytes Absolute: 0.6 10*3/uL (ref 0.1–1.0)
Neutro Abs: 4.3 10*3/uL (ref 1.4–7.7)
RDW: 14.5 % (ref 11.5–14.6)

## 2013-01-07 LAB — BASIC METABOLIC PANEL
CO2: 25 mEq/L (ref 19–32)
Calcium: 8.8 mg/dL (ref 8.4–10.5)
Chloride: 106 mEq/L (ref 96–112)
Glucose, Bld: 94 mg/dL (ref 70–99)
Sodium: 137 mEq/L (ref 135–145)

## 2013-01-07 LAB — HEPATIC FUNCTION PANEL
Albumin: 3.8 g/dL (ref 3.5–5.2)
Total Protein: 6.8 g/dL (ref 6.0–8.3)

## 2013-01-07 LAB — POCT URINALYSIS DIPSTICK
Glucose, UA: NEGATIVE
Nitrite, UA: NEGATIVE
Protein, UA: NEGATIVE
Urobilinogen, UA: 0.2

## 2013-01-07 LAB — TSH: TSH: 0.93 u[IU]/mL (ref 0.35–5.50)

## 2013-01-07 NOTE — Progress Notes (Signed)
  Subjective:    Patient ID: Shane [name], male    DOB: 08/15/1965, 47 y.o.   MRN: [id number]  HPI   46 year old patient who presents with a two-month history of pain involving his right index finger.  His job requires considerable use of both hands. There is been no direct trauma. Pain is aggravated by flexion of the finger  Past Medical History  Diagnosis Date  . GERD (gastroesophageal reflux disease)   . Obesity     History   Social History  . Marital Status: Married    Spouse Name: N/A    Number of Children: N/A  . Years of Education: N/A   Occupational History  . Not on file.   Social History Main Topics  . Smoking status: Never Smoker   . Smokeless tobacco: Never Used  . Alcohol Use: No  . Drug Use: No  . Sexually Active: Not on file   Other Topics Concern  . Not on file   Social History Narrative  . No narrative on file    Past Surgical History  Procedure Laterality Date  . Cervical disc surgery    . Carpal pummel repair      Family History  Problem Relation Age of Onset  . Heart disease Mother     mitrial valve repair, CABG   . Cancer Father     prostate ca  . Hypertension Father     Allergies  Allergen Reactions  . Penicillins Cross Reactors Rash    No current outpatient prescriptions on file prior to visit.   No current facility-administered medications on file prior to visit.    BP 140/76  Pulse 74  Temp(Src) 98.5 F (36.9 C) (Oral)  Resp 20  Wt 271 lb (122.925 kg)  BMI 38.88 kg/m2  SpO2 99%       Review of Systems  Constitutional: Negative for fever, chills, appetite change and fatigue.  HENT: Negative for hearing loss, ear pain, congestion, sore throat, trouble swallowing, neck stiffness, dental problem, voice change and tinnitus.   Eyes: Negative for pain, discharge and visual disturbance.  Respiratory: Negative for cough, chest tightness, wheezing and stridor.   Cardiovascular: Negative for chest pain, palpitations and  leg swelling.  Gastrointestinal: Negative for nausea, vomiting, abdominal pain, diarrhea, constipation, blood in stool and abdominal distention.  Genitourinary: Negative for urgency, hematuria, flank pain, discharge, difficulty urinating and genital sores.  Musculoskeletal: Negative for myalgias, back pain, joint swelling, arthralgias and gait problem.       Right hand pain  Skin: Negative for rash.  Neurological: Negative for dizziness, syncope, speech difficulty, weakness, numbness and headaches.  Hematological: Negative for adenopathy. Does not bruise/bleed easily.  Psychiatric/Behavioral: Negative for behavioral problems and dysphoric mood. The patient is not nervous/anxious.        Objective:   Physical Exam  Constitutional: He appears well-developed and well-nourished. No distress.  Musculoskeletal:  Tenderness some mild soft tissue swelling was noted involving the right index finger between the MCP and PIP joints most marked medially          Assessment & Plan:   Tendinitis right index finger.  Procedure note. After informed consent and injection of 1 cc lidocaine and 1 cc (80 mg) Solu-Medrol injected along the medial aspect of the right index finger between the MCP and PIP joints. The patient noted immediate pain relief. Bandage applied

## 2013-01-14 ENCOUNTER — Encounter: Payer: Self-pay | Admitting: Internal Medicine

## 2013-01-14 ENCOUNTER — Ambulatory Visit (INDEPENDENT_AMBULATORY_CARE_PROVIDER_SITE_OTHER): Payer: BC Managed Care – PPO | Admitting: Internal Medicine

## 2013-01-14 VITALS — BP 130/80 | HR 71 | Temp 98.9°F | Resp 20 | Ht 68.5 in | Wt 266.0 lb

## 2013-01-14 DIAGNOSIS — E669 Obesity, unspecified: Secondary | ICD-10-CM

## 2013-01-14 DIAGNOSIS — Z0000 Encounter for general adult medical examination without abnormal findings: Secondary | ICD-10-CM

## 2013-01-14 NOTE — Progress Notes (Signed)
Subjective:    Patient ID: Shane [name], male    DOB: 1966/05/10, 47 y.o.   MRN: [id number]  HPI   47 year old patient who is seen today for a preventive health examination. He takes no chronic medications. Medical problems include exogenous obesity. No new concerns or complaints.  Past medical history is pertinent for a right knee arthroscopic surgery in February of 2013 The patient had a negative home sleep study in December of 2013   Wt Readings from Last 3 Encounters:  01/14/13 266 lb (120.657 kg)  01/07/13 271 lb (122.925 kg)  01/17/12 258 lb (117.028 kg)    Past Medical History  Diagnosis Date  . GERD (gastroesophageal reflux disease)   . Obesity     History   Social History  . Marital Status: Married    Spouse Name: N/A    Number of Children: N/A  . Years of Education: N/A   Occupational History  . Not on file.   Social History Main Topics  . Smoking status: Never Smoker   . Smokeless tobacco: Never Used  . Alcohol Use: No  . Drug Use: No  . Sexually Active: Not on file   Other Topics Concern  . Not on file   Social History Narrative  . No narrative on file    Past Surgical History  Procedure Laterality Date  . Cervical disc surgery    . Carpal pummel repair      Family History  Problem Relation Age of Onset  . Heart disease Mother     mitrial valve repair, CABG   . Cancer Father     prostate ca  . Hypertension Father     Allergies  Allergen Reactions  . Penicillins Cross Reactors Rash    Current Outpatient Prescriptions on File Prior to Visit  Medication Sig Dispense Refill  . ibuprofen (ADVIL,MOTRIN) 200 MG tablet Take 400 mg by mouth as needed for pain.       No current facility-administered medications on file prior to visit.    BP 130/80  Pulse 71  Temp(Src) 98.9 F (37.2 C) (Oral)  Resp 20  Ht 5' 8.5" (1.74 m)  Wt 266 lb (120.657 kg)  BMI 39.85 kg/m2  SpO2 98%     Review of Systems  Constitutional: Negative for  fever, chills, activity change, appetite change and fatigue.  HENT: Negative for hearing loss, ear pain, congestion, rhinorrhea, sneezing, mouth sores, trouble swallowing, neck pain, neck stiffness, dental problem, voice change, sinus pressure and tinnitus.   Eyes: Negative for photophobia, pain, redness and visual disturbance.  Respiratory: Negative for apnea, cough, choking, chest tightness, shortness of breath and wheezing.   Cardiovascular: Negative for chest pain, palpitations and leg swelling.  Gastrointestinal: Negative for nausea, vomiting, abdominal pain, diarrhea, constipation, blood in stool, abdominal distention, anal bleeding and rectal pain.  Genitourinary: Negative for dysuria, urgency, frequency, hematuria, flank pain, decreased urine volume, discharge, penile swelling, scrotal swelling, difficulty urinating, genital sores and testicular pain.  Musculoskeletal: Negative for myalgias, back pain, joint swelling, arthralgias and gait problem.  Skin: Negative for color change, rash and wound.  Neurological: Negative for dizziness, tremors, seizures, syncope, facial asymmetry, speech difficulty, weakness, light-headedness, numbness and headaches.  Hematological: Negative for adenopathy. Does not bruise/bleed easily.  Psychiatric/Behavioral: Negative for suicidal ideas, hallucinations, behavioral problems, confusion, sleep disturbance, self-injury, dysphoric mood, decreased concentration and agitation. The patient is not nervous/anxious.        Objective:   Physical Exam  Constitutional: He appears  well-developed and well-nourished.  Weight 266  HENT:  Head: Normocephalic and atraumatic.  Right Ear: External ear normal.  Left Ear: External ear normal.  Nose: Nose normal.  Mouth/Throat: Oropharynx is clear and moist.  Eyes: Conjunctivae and EOM are normal. Pupils are equal, round, and reactive to light. No scleral icterus.  Neck: Normal range of motion. Neck supple. No JVD present.  No thyromegaly present.  Cardiovascular: Regular rhythm, normal heart sounds and intact distal pulses.  Exam reveals no gallop and no friction rub.   No murmur heard. Pulmonary/Chest: Effort normal and breath sounds normal. He exhibits no tenderness.  Abdominal: Soft. Bowel sounds are normal. He exhibits no distension and no mass. There is no tenderness.  Genitourinary: Prostate normal and penis normal.  Musculoskeletal: Normal range of motion. He exhibits no edema and no tenderness.  Lymphadenopathy:    He has no cervical adenopathy.  Neurological: He is alert. He has normal reflexes. No cranial nerve deficit. Coordination normal.  Skin: Skin is warm and dry. No rash noted.  Psychiatric: He has a normal mood and affect. His behavior is normal.          Assessment & Plan:  Preventive health examination Exogenous obesity. Weight loss encouraged Regular exercise regimen encouraged  Recheck 1 or 2 years

## 2013-01-14 NOTE — Patient Instructions (Signed)
It is important that you exercise regularly, at least 20 minutes 3 to 4 times per week.  If you develop chest pain or shortness of breath seek  medical attention.  You need to lose weight.  Consider a lower calorie diet and regular exercise.Health Maintenance, Males A healthy lifestyle and preventative care can promote health and wellness.  Maintain regular health, dental, and eye exams.  Eat a healthy diet. Foods like vegetables, fruits, whole grains, low-fat dairy products, and lean protein foods contain the nutrients you need without too many calories. Decrease your intake of foods high in solid fats, added sugars, and salt. Get information about a proper diet from your caregiver, if necessary.  Regular physical exercise is one of the most important things you can do for your health. Most adults should get at least 150 minutes of moderate-intensity exercise (any activity that increases your heart rate and causes you to sweat) each week. In addition, most adults need muscle-strengthening exercises on 2 or more days a week.   Maintain a healthy weight. The body mass index (BMI) is a screening tool to identify possible weight problems. It provides an estimate of body fat based on height and weight. Your caregiver can help determine your BMI, and can help you achieve or maintain a healthy weight. For adults 20 years and older:  A BMI below 18.5 is considered underweight.  A BMI of 18.5 to 24.9 is normal.  A BMI of 25 to 29.9 is considered overweight.  A BMI of 30 and above is considered obese.  Maintain normal blood lipids and cholesterol by exercising and minimizing your intake of saturated fat. Eat a balanced diet with plenty of fruits and vegetables. Blood tests for lipids and cholesterol should begin at age 30 and be repeated every 5 years. If your lipid or cholesterol levels are high, you are over 50, or you are a high risk for heart disease, you may need your cholesterol levels checked  more frequently.Ongoing high lipid and cholesterol levels should be treated with medicines, if diet and exercise are not effective.  If you smoke, find out from your caregiver how to quit. If you do not use tobacco, do not start.  If you choose to drink alcohol, do not exceed 2 drinks per day. One drink is considered to be 12 ounces (355 mL) of beer, 5 ounces (148 mL) of wine, or 1.5 ounces (44 mL) of liquor.  Avoid use of street drugs. Do not share needles with anyone. Ask for help if you need support or instructions about stopping the use of drugs.  High blood pressure causes heart disease and increases the risk of stroke. Blood pressure should be checked at least every 1 to 2 years. Ongoing high blood pressure should be treated with medicines if weight loss and exercise are not effective.  If you are 16 to 46 years old, ask your caregiver if you should take aspirin to prevent heart disease.  Diabetes screening involves taking a blood sample to check your fasting blood sugar level. This should be done once every 3 years, after age 24, if you are within normal weight and without risk factors for diabetes. Testing should be considered at a younger age or be carried out more frequently if you are overweight and have at least 1 risk factor for diabetes.  Colorectal cancer can be detected and often prevented. Most routine colorectal cancer screening begins at the age of 44 and continues through age 55. However, your caregiver  may recommend screening at an earlier age if you have risk factors for colon cancer. On a yearly basis, your caregiver may provide home test kits to check for hidden blood in the stool. Use of a small camera at the end of a tube, to directly examine the colon (sigmoidoscopy or colonoscopy), can detect the earliest forms of colorectal cancer. Talk to your caregiver about this at age 79, when routine screening begins. Direct examination of the colon should be repeated every 5 to 10  years through age 49, unless early forms of pre-cancerous polyps or small growths are found.  Hepatitis C blood testing is recommended for all people born from [birth year] through 1964 and any individual with known risks for hepatitis C.  Healthy men should no longer receive prostate-specific antigen (PSA) blood tests as part of routine cancer screening. Consult with your caregiver about prostate cancer screening.  Testicular cancer screening is not recommended for adolescents or adult males who have no symptoms. Screening includes self-exam, caregiver exam, and other screening tests. Consult with your caregiver about any symptoms you have or any concerns you have about testicular cancer.  Practice safe sex. Use condoms and avoid high-risk sexual practices to reduce the spread of sexually transmitted infections (STIs).  Use sunscreen with a sun protection factor (SPF) of 30 or greater. Apply sunscreen liberally and repeatedly throughout the day. You should seek shade when your shadow is shorter than you. Protect yourself by wearing long sleeves, pants, a wide-brimmed hat, and sunglasses year round, whenever you are outdoors.  Notify your caregiver of new moles or changes in moles, especially if there is a change in shape or color. Also notify your caregiver if a mole is larger than the size of a pencil eraser.  A one-time screening for abdominal aortic aneurysm (AAA) and surgical repair of large AAAs by sound wave imaging (ultrasonography) is recommended for ages 37 to 59 years who are current or former smokers.  Stay current with your immunizations. Document Released: 12/24/2007 Document Revised: 09/19/2011 Document Reviewed: 11/22/2010 [facility] Patient Information 2014 [place], [place].

## 2013-02-19 ENCOUNTER — Telehealth: Payer: Self-pay | Admitting: Internal Medicine

## 2013-02-19 NOTE — Telephone Encounter (Signed)
Patient Information:  Caller Name: Adriene  Phone: [phone]  Patient: Shane, [name]  Gender: Male  DOB: 03-09-1966  Age: 47 Years  PCP: Eleonore Chiquito [facility])  Office Follow Up:  Does the office need to follow up with this patient?: Yes  Instructions For The Office: All appointments with PCP are full.  Please follow up regarding possible work in or if you prefer to see him 02/20/13.  Thank you.   Symptoms  Reason For Call & Symptoms: States he was seen at [facility] ED on 02/17/13 and diagnosed with strained left arm.  He was advised to follow up with PCP if not improved in a couple of days.  He does not feel that he can perform his work with arm feeling the way it does due to pain and concerns that he may worsen the condition.  Current pain rated at 1-2 when on medication, up to 6 or 7 if moves the wrong way and when medication wears off. Caller asked for work note. Advised See Today or Tomorrow in Office per Arm Pain guideline due to Patient wants to be seen.  Reviewed Health History In EMR: Yes  Reviewed Medications In EMR: Yes  Reviewed Allergies In EMR: Yes  Reviewed Surgeries / Procedures: Yes  Date of Onset of Symptoms: 02/15/2013  Treatments Tried: Ice, Heat, Muscle relaxer with some relief (Tramadol), sling  Treatments Tried Worked: No  Guideline(s) Used:  Arm Pain  Disposition Per Guideline:   See Today or Tomorrow in Office  Reason For Disposition Reached:   Patient wants to be seen  Advice Given:  Reassurance - Muscle Strain  Definition: A muscle strain occurs from over-stretching or tearing a muscle. People often call this a "pulled muscle". This muscle injury can occur while exercising, while lifting something, or sometimes during normal activities. -  Symptoms: People often describe a sharp pain or popping when the muscle strain occurs. The muscle pain worsens with movement of the arm.  Apply Heat to the Area:  Beginning 48 hours after an  injury, apply a warm washcloth or heating pad for 10 minutes 3 times a day.  Rest:   You should try to avoid any exercise or activity that caused this pain for the next 3 days.  Call Back If:  You become worse.  Patient Will Follow Care Advice:  YES

## 2013-02-20 ENCOUNTER — Ambulatory Visit: Payer: BC Managed Care – PPO | Admitting: Family Medicine

## 2013-02-20 ENCOUNTER — Encounter: Payer: Self-pay | Admitting: Internal Medicine

## 2013-02-20 ENCOUNTER — Ambulatory Visit (INDEPENDENT_AMBULATORY_CARE_PROVIDER_SITE_OTHER): Payer: BC Managed Care – PPO | Admitting: Internal Medicine

## 2013-02-20 VITALS — BP 140/80 | HR 77 | Temp 98.7°F | Resp 20 | Wt 271.0 lb

## 2013-02-20 DIAGNOSIS — S43429A Sprain of unspecified rotator cuff capsule, initial encounter: Secondary | ICD-10-CM

## 2013-02-20 DIAGNOSIS — S43422A Sprain of left rotator cuff capsule, initial encounter: Secondary | ICD-10-CM

## 2013-02-20 MED ORDER — METHYLPREDNISOLONE ACETATE 80 MG/ML IJ SUSP
80.0000 mg | Freq: Once | INTRAMUSCULAR | Status: AC
  Administered 2013-02-20: 80 mg via INTRAMUSCULAR

## 2013-02-20 MED ORDER — TRAMADOL HCL 50 MG PO TABS: 50.0000 mg | ORAL_TABLET | Freq: Three times a day (TID) | ORAL | Status: DC | PRN

## 2013-02-20 NOTE — Telephone Encounter (Signed)
Dr. [name] Bouchard agreed to see pt Wed a.m. Appt made. Pt aware.

## 2013-02-20 NOTE — Progress Notes (Signed)
  Subjective:    Patient ID: Shane [name], male    DOB: 1966/03/04, 47 y.o.   MRN: [id number]  HPI  47 year old right-handed patient who presents with a five-day history of atraumatic left shoulder pain. Pain began last Friday and today and while at rest. There has been no direct trauma. Pain was quite severe and he was seen at a local emergency room Sunday and did benefit from some medication pain is aggravated by movement. No fever or constitutional complaints.  Past Medical History  Diagnosis Date  . GERD (gastroesophageal reflux disease)   . Obesity     History   Social History  . Marital Status: Married    Spouse Name: N/A    Number of Children: N/A  . Years of Education: N/A   Occupational History  . Not on file.   Social History Main Topics  . Smoking status: Never Smoker   . Smokeless tobacco: Never Used  . Alcohol Use: No  . Drug Use: No  . Sexual Activity: Not on file   Other Topics Concern  . Not on file   Social History Narrative  . No narrative on file    Past Surgical History  Procedure Laterality Date  . Cervical disc surgery    . Carpal pummel repair      Family History  Problem Relation Age of Onset  . Heart disease Mother     mitrial valve repair, CABG   . Cancer Father     prostate ca  . Hypertension Father     Allergies  Allergen Reactions  . Penicillins Cross Reactors Rash    Current Outpatient Prescriptions on File Prior to Visit  Medication Sig Dispense Refill  . ibuprofen (ADVIL,MOTRIN) 200 MG tablet Take 400 mg by mouth as needed for pain.       No current facility-administered medications on file prior to visit.    BP 140/80  Pulse 77  Temp(Src) 98.7 F (37.1 C) (Oral)  Resp 20  Wt 271 lb (122.925 kg)  BMI 40.6 kg/m2  SpO2 98%       Review of Systems  Musculoskeletal: Arthralgias: Left shoulder pain with movement.       Objective:   Physical Exam  Constitutional: He appears well-developed and  well-nourished. No distress.  Musculoskeletal:  Positive painful arc test at 120 Negative external rotation resistance test and negative drop arm test Positive internal rotation lag test as well as external rotation lag test          Assessment & Plan:   Rotator cuff tendinopathy. We'll treat symptomatically with analgesics rest. We'll give information concerning the rehabilitation and strengthening exercises.

## 2013-02-20 NOTE — Patient Instructions (Signed)

## 2013-09-09 ENCOUNTER — Telehealth: Payer: Self-pay | Admitting: Internal Medicine

## 2013-09-09 ENCOUNTER — Emergency Department (HOSPITAL_COMMUNITY): Payer: BC Managed Care – PPO

## 2013-09-09 ENCOUNTER — Emergency Department (HOSPITAL_COMMUNITY)
Admission: EM | Admit: 2013-09-09 | Discharge: 2013-09-09 | Disposition: A | Discharge status: Home / Self Care | Payer: BC Managed Care – PPO | Attending: Emergency Medicine | Admitting: Emergency Medicine

## 2013-09-09 ENCOUNTER — Encounter (HOSPITAL_COMMUNITY): Payer: Self-pay | Admitting: Emergency Medicine

## 2013-09-09 DIAGNOSIS — G8929 Other chronic pain: Secondary | ICD-10-CM | POA: Insufficient documentation

## 2013-09-09 DIAGNOSIS — R002 Palpitations: Secondary | ICD-10-CM | POA: Insufficient documentation

## 2013-09-09 DIAGNOSIS — E669 Obesity, unspecified: Secondary | ICD-10-CM | POA: Insufficient documentation

## 2013-09-09 DIAGNOSIS — R000 Tachycardia, unspecified: Secondary | ICD-10-CM | POA: Insufficient documentation

## 2013-09-09 DIAGNOSIS — Z880 Allergy status to penicillin: Secondary | ICD-10-CM | POA: Insufficient documentation

## 2013-09-09 DIAGNOSIS — Z8719 Personal history of other diseases of the digestive system: Secondary | ICD-10-CM | POA: Insufficient documentation

## 2013-09-09 DIAGNOSIS — Z791 Long term (current) use of non-steroidal anti-inflammatories (NSAID): Secondary | ICD-10-CM | POA: Insufficient documentation

## 2013-09-09 HISTORY — DX: Pain in unspecified knee: M25.569

## 2013-09-09 HISTORY — DX: Other chronic pain: G89.29

## 2013-09-09 HISTORY — DX: Pain in left shoulder: M25.512

## 2013-09-09 LAB — BASIC METABOLIC PANEL
BUN: 13 mg/dL (ref 6–23)
CALCIUM: 9.5 mg/dL (ref 8.4–10.5)
CO2: 22 mEq/L (ref 19–32)
Chloride: 104 mEq/L (ref 96–112)
Creatinine, Ser: 0.76 mg/dL (ref 0.50–1.35)
GLUCOSE: 94 mg/dL (ref 70–99)
POTASSIUM: 3.9 meq/L (ref 3.7–5.3)
SODIUM: 142 meq/L (ref 137–147)

## 2013-09-09 LAB — CBC
HCT: 44.3 % (ref 39.0–52.0)
Hemoglobin: 15.1 g/dL (ref 13.0–17.0)
MCH: 29.4 pg (ref 26.0–34.0)
MCHC: 34.1 g/dL (ref 30.0–36.0)
MCV: 86.4 fL (ref 78.0–100.0)
Platelets: 176 10*3/uL (ref 150–400)
RBC: 5.13 MIL/uL (ref 4.22–5.81)
RDW: 14.1 % (ref 11.5–15.5)
WBC: 8.2 10*3/uL (ref 4.0–10.5)

## 2013-09-09 LAB — MAGNESIUM: MAGNESIUM: 2 mg/dL (ref 1.5–2.5)

## 2013-09-09 LAB — D-DIMER, QUANTITATIVE: D-Dimer, Quant: <0.27 ug/mL-FEU (ref 0.00–0.48)

## 2013-09-09 LAB — I-STAT TROPONIN, ED: Troponin i, poc: 0 ng/mL (ref 0.00–0.08)

## 2013-09-09 NOTE — Discharge Instructions (Signed)
°Emergency Department Resource Guide °1) Find a Doctor and Pay Out of Pocket °Although you won't have to find out who is covered by your insurance plan, it is a good idea to ask around and get recommendations. You will then need to call the office and see if the doctor you have chosen will accept you as a new patient and what types of options they offer for patients who are self-pay. Some doctors offer discounts or will set up payment plans for their patients who do not have insurance, but you will need to ask so you aren't surprised when you get to your appointment. ° °2) Contact Your Local Health Department °Not all health departments have doctors that can see patients for sick visits, but many do, so it is worth a call to see if yours does. If you don't know where your local health department is, you can check in your phone book. The CDC also has a tool to help you locate your state's health department, and many state websites also have listings of all of their local health departments. ° °3) Find a Walk-in Clinic °If your illness is not likely to be very severe or complicated, you may want to try a walk in clinic. These are popping up all over the country in pharmacies, drugstores, and shopping centers. They're usually staffed by nurse practitioners or physician assistants that have been trained to treat common illnesses and complaints. They're usually fairly quick and inexpensive. However, if you have serious medical issues or chronic medical problems, these are probably not your best option. ° °No Primary Care Doctor: °- Call Health Connect at  832-8000 - they can help you locate a primary care doctor that  accepts your insurance, provides certain services, etc. °- Physician Referral Service- 1-800-533-3463 ° °Chronic Pain Problems: °Organization         Address  Phone   Notes  ° [facility]  (336) 297-2271 Patients need to be referred by their primary care doctor.  ° °Medication  Assistance: °Organization         Address  Phone   Notes  °[place] [facility] [address] °[place], [place] [address] (336) 641-8030 --Must be a resident of [place] °-- Must have NO insurance coverage whatsoever (no Medicaid/ Medicare, etc.) °-- The pt. MUST have a primary care doctor that directs their care regularly and follows them in the community °  °[facility]  (866) 331-1348   °[employer]  (888) 892-1162   ° °Agencies that provide inexpensive medical care: °Organization         Address  Phone   Notes  °[facility]  (336) 832-8035   °[facility]    (336) 832-7272   °[facility] [address] °[place], [place] [address] (336) 832-4777   °[facility] [address], °[place] (336) 271-4999   °[facility]    (336) 373-0678   °[facility]    (336) 272-1050   °[facility] ° [address], [place] Phone:  (336) 832-4444, Fax:  (336) 832-4440 Hours of Operation:  9 am - 6 pm, M-F.  Also accepts Medicaid/Medicare and self-pay.  °[facility] ° [address], [place] Phone: (336) 832-3150, Fax: (336) [phone]. Hours of Operation:  8:30 am - 5:30 pm, M-F.  Also accepts Medicaid and self-pay.  °[facility] [place] [address], [place] Phone: (336) 878-6027   °[facility] [address], [place], [place] (336)723-1848, Ext. 123 Mondays & Thursdays: 7-9 AM.  First 15 patients are seen on a first come, first serve basis. °  ° °Medicaid-accepting [place] Providers: ° °Organization         Address  Phone   Notes  °[facility] [address], [place] (336) 641-2100 Also accepts self-pay patients.  °[facility] [address], [place] ° (336) 856-9996   °[facility] [address], [place]  (336) 288-8857   °[facility] [address] [place] [address], [place] (336) 299-7000   °Veita Bland [address], [place]  ° (336) 373-1557 Only accepts [place] Access Medicaid patients after they have their name applied to their card.  ° °Self-Pay (no insurance) in [place]: ° °Organization         Address  Phone   Notes  °Sickle Cell Patients, [facility] [address], [place] (336) 832-1970   °[facility] [address], [place] (336) 832-4400   °[facility] ° [address],  (336) 992-4800   °[facility]/Dr. Osei-Bonsu ° [address] [place] [address], [place] or [address], [place] (336) 841-8500 Phone number for both [place] and [place] locations is the same.  °[facility] [address], [place] (336) 299-0000   °[facility] [address] [place] [address], [place] or [address] (336) 852-7530 °(336) 878-2260   °[facility] [address], [place] (336) 350-1642, phone; (336) 294-5005, fax Sees patients 1st and 3rd Saturday of every month.  Must not qualify for public or private insurance (i.e. Medicaid, Medicare, [place] Health Choice, Veterans' Benefits) • Household income should be no more than 200% of the poverty level •The clinic cannot treat you if you are pregnant or think you are pregnant • Sexually transmitted diseases are not treated at the clinic.  ° ° °Dental Care: °Organization         Address  Phone  Notes  °[place] [facility] [address], [place] (336) 641-6152 Accepts children up to age 21 who are enrolled in Medicaid or [place] Health Choice; pregnant women with a Medicaid card; and children who have applied for Medicaid or [place] Health Choice, but were declined, whose parents can pay a reduced fee at time of service.  °[place]  [facility] [place]  [address], [place] (336) 641-7733 Accepts children up to age 21 who are enrolled in Medicaid or  Health Choice; pregnant women with a Medicaid card; and children who have applied for Medicaid or  Health Choice, but were declined, whose parents can pay a reduced fee at time of service.  °[facility] ° [address],  (336) 641-4533 Patients are seen by appointment only. Walk-ins are not accepted. [facility] will see patients 18 years of age and older. °Monday - Tuesday (8am-5pm) °Most Wednesdays (8:30-5pm) °$30 per visit, cash only  °[facility] ° [address], [place] (336) 641-4533 Patients are seen by appointment only. Walk-ins are not accepted. [facility] will see patients 18 years of age and older. °One   Wednesday Evening (Monthly: Volunteer Based).  $30 per visit, cash only  °[facility]  (919) 537-3737 for adults; Children under age 4, call Graduate Pediatric Dentistry at ([phone]) [phone]. Children aged 4-14, please call (919) [phone] to request a pediatric application. ° Dental services are provided in all areas of dental care including fillings, crowns and bridges, complete and partial dentures, implants, gum treatment, root canals, and extractions. Preventive care is also provided. Treatment is provided to both adults and children. °Patients are selected via a lottery and there is often a waiting list. °  °[facility] [address], °[place] ° (336) 763-8833 [email or web address].drcivils.[email or web address] °  °[facility] [address], [place], [place] (336)723-1848, Ext. 123 Second and Fourth Thursday of each month, opens at 6:30 AM; Clinic ends at 9 AM.  Patients are seen on a first-come first-served basis, and a limited number are seen during each clinic.  ° °[facility] ° [address], [place], [place] (336) 723-7904    Eligibility Requirements °You must have lived in [place], [place], or [place] counties for at least the last three months. °  You cannot be eligible for state or federal sponsored healthcare insurance, including Veterans Administration, Medicaid, or Medicare. °  You generally cannot be eligible for healthcare insurance through your employer.  °  How to apply: °Eligibility screenings are held every Tuesday and Wednesday afternoon from 1:00 pm until 4:00 pm. You do not need an appointment for the interview!  °[facility] [address], [place], [place] 336-631-2330   °[facility]  336-342-8273   °[facility]  336-703-3100   °[facility]  336-570-6415   ° °Behavioral Health Resources in the Community: °Intensive Outpatient Programs °Organization         Address  Phone  Notes  °[facility] [address], [place], [place] 336-878-6098   °[facility] [address], [place], [place] 336-832-9800   °[facility] [address], [place], [place] ° 336-882-2125   °[facility] [address],  °[place], [place] 1-800-853-5163 or 336-641-4981   °Substance Abuse Resources °Organization         Address  Phone  Notes  °[facility]  336-882-2125   °[facility]  336-784-9470   °[facility]  336-285-9073   °[facility]  336-845-3988   °Residential & Outpatient Substance Abuse Program  1-800-659-3381   °Psychological Services °Organization         Address  Phone  Notes  ° Health  336- 832-9600   °[facility]  336- 378-7881   °[facility] [address], [place] 1-800-853-5163 or 336-641-4981   ° °Mobile Crisis Teams °Organization         Address  Phone  Notes  °[facility], Mobile Crisis Care Unit  1-877-626-1772   °Assertive °[facility] ° [address]  [place], [place] 336-834-9664   °Sharon DeEsch [address] °[place] 336-554-5454   ° °Self-Help/Support Groups °Organization         Address  Phone             Notes  °[facility]  - variety of support groups  336- 373-1402 Call for more information  °Narcotics Anonymous (NA), [facility] [address], °[place]  2 meetings at this location  ° °  Residential Treatment Programs Organization         Address  Phone  Notes  [facility] [address],    [place]  1-3613143863   [facility]  [address], [place] [address], [place], [place]   [place], [place] (732)790-0212 Admissions: 8am-3pm M-F  [facility] [place] [address]    [place], [place] 423-536-1443   [facility] [address] [place], [place], [place]   [facility] [address]  [place], [place]   [facility] - Intensive Outpatient [address], [place], [place]   [facility] ([place]  [place], [place] 1-949 077 8576 or [phone]   [facility] [address], [place], [place] Accepts Medicaid  [facility] [address],  [place] 1-204-184-2100 Substance Abuse/Addiction Treatment   [facility] Organization         Address  Phone  Notes  [facility]  (530)511-5849   Domenic Schwab, PhD [address] [place], [place]   (863)024-5021 or (671) 314-6027   [place], [place] 979-483-1437   [facility] [address] [place], [place] (304)331-4504 Insurance/Medicaid/sponsorship through [facility] [address] [place]                                    [place], [place] 352-300-8249 [place] [address], [place] (223)[phone]    Dr. Adele Schilder  806-761-6609   [facility] Dept. 1) [address] [place] 2) [place] 3)  [place] [address], [place] (406)264-3659 ([phone]) [phone]  (774) [phone]   [place] 4800872196 or ([phone] (After Hours)       Avoid avoid caffinated products, such as teas, colas, coffee, chocolate. Avoid over the counter cold medicines, herbal or "natural vitamin" products, and illicit drugs because they can contain stimulants. You may benefit from wearing a Holter monitor to further investigate your symptoms.  Call the Cardiologist tomorrow to schedule a follow up appointment this week to discuss further testing.  Return to the Emergency Department immediately if worsening.

## 2013-09-09 NOTE — ED Notes (Signed)
Per pt sts for the last few weeks he has had some periods of irregular heart rate and tachycardia. sts also chest pain at times with SOB. Sig family hx of cardiac issues.

## 2013-09-09 NOTE — Telephone Encounter (Signed)
Pts spouse called to report "rapid heart rate". When RN called back/no answer/left vm to call office back/TMckinney, RN

## 2013-09-09 NOTE — Telephone Encounter (Signed)
Spoke to Adriene pt's wife, asked to speak with pt? Adriene said pt is at work, said he is trying to have his boss take him to the ED. Told her okay I will call his number. Left detailed message for pt to go to ED and let me know he got this message and is taken care.

## 2013-09-09 NOTE — ED Notes (Signed)
Pt c/o intermittent palpitations x 2 weeks. sts "feels like his heart is trying to beat out of my chest and through my throat" pt denies pain, diaphoresis, nausea, lightheadedness or dizziness. Pt endorses SOB with today's episodes. Pt sts episodes last appx 1 hour before resolving on own. Pt in NAD at present time. Instructed to push call bell if he feels sensation again.

## 2013-09-09 NOTE — ED Notes (Signed)
Pt c/o momentary palpitations. Pt HR 890-95bpm NSR. Pt denies pain.

## 2013-09-09 NOTE — ED Provider Notes (Signed)
CSN: [id number]     Arrival date & time 09/09/13  1408 History   First MD Initiated Contact with Patient 09/09/13 1510     Chief Complaint  Patient presents with  . Chest Pain  . Tachycardia      HPI Pt was seen at 1520. Per pt, c/o gradual onset and persistence of multiple intermittent episodes of palpitations for the past 2 weeks. Describes his palpitations as "my heart is trying to beat out of my chest and into my throat," has been associated with SOB and "feeling anxious." Pt states his symptoms occur randomly and at any time, while at rest or on exertion. Symptoms last approximately 10 to 15 minutes before spontaneously resolving, then returning again. Pt did not take his heart rate during any of these episodes. Denies nausea, no near syncope/syncope, no diaphoresis, no CP, no cough, no back pain, no abd pain.    Past Medical History  Diagnosis Date  . GERD (gastroesophageal reflux disease)   . Obesity   . Chronic knee pain   . Left shoulder pain    Past Surgical History  Procedure Laterality Date  . Cervical disc surgery    . Carpal pummel repair     Family History  Problem Relation Age of Onset  . Heart disease Mother     mitrial valve repair, CABG   . Cancer Father     prostate ca  . Hypertension Father    History  Substance Use Topics  . Smoking status: Never Smoker   . Smokeless tobacco: Never Used  . Alcohol Use: No    Review of Systems ROS: Statement: All systems negative except as marked or noted in the HPI; Constitutional: Negative for fever and chills. ; ; Eyes: Negative for eye pain, redness and discharge. ; ; ENMT: Negative for ear pain, hoarseness, nasal congestion, sinus pressure and sore throat. ; ; Cardiovascular: +SOB, palpitations. Negative for chest pain, diaphoresis, and peripheral edema. ; ; Respiratory: Negative for cough, wheezing and stridor. ; ; Gastrointestinal: Negative for nausea, vomiting, diarrhea, abdominal pain, blood in stool,  hematemesis, jaundice and rectal bleeding. . ; ; Genitourinary: Negative for dysuria, flank pain and hematuria. ; ; Musculoskeletal: Negative for back pain and neck pain. Negative for swelling and trauma.; ; Skin: Negative for pruritus, rash, abrasions, blisters, bruising and skin lesion.; ; Neuro: Negative for headache, lightheadedness and neck stiffness. Negative for weakness, altered level of consciousness , altered mental status, extremity weakness, paresthesias, involuntary movement, seizure and syncope.; Psych:  +anxiety. No SI, no SA, no HI, no hallucinations.    Allergies  Penicillins cross reactors  Home Medications   Current Outpatient Rx  Name  Route  Sig  Dispense  Refill  . diclofenac (VOLTAREN) 75 MG EC tablet   Oral   Take 1 tablet by mouth 2 (two) times daily.         Marland Kitchen ibuprofen (ADVIL,MOTRIN) 200 MG tablet   Oral   Take 400 mg by mouth as needed for pain.         . traMADol (ULTRAM) 50 MG tablet   Oral   Take 1 tablet (50 mg total) by mouth every 8 (eight) hours as needed for pain.   30 tablet   0    BP 134/85  Pulse 95  Temp(Src) 97.3 F (36.3 C)  Resp 18  SpO2 93% Physical Exam 1525: Physical examination:  Nursing notes reviewed; Vital signs and O2 SAT reviewed;  Constitutional: Well developed, Well nourished,  Well hydrated, In no acute distress; Head:  Normocephalic, atraumatic; Eyes: EOMI, PERRL, No scleral icterus; ENMT: Mouth and pharynx normal, Mucous membranes moist; Neck: Supple, Full range of motion, No lymphadenopathy; Cardiovascular: Regular rate and rhythm, No murmur, rub, or gallop; Respiratory: Breath sounds clear & equal bilaterally, No rales, rhonchi, wheezes.  Speaking full sentences with ease, Normal respiratory effort/excursion; Chest: Nontender, Movement normal; Abdomen: Soft, Nontender, Nondistended, Normal bowel sounds; Genitourinary: No CVA tenderness; Extremities: Pulses normal, No tenderness, No edema, No calf edema or asymmetry.;  Neuro: AA&Ox3, Major CN grossly intact.  Speech clear. No gross focal motor or sensory deficits in extremities. Climbs on and off stretcher easily by himself. Gait steady.; Skin: Color normal, Warm, Dry.   ED Course  Procedures   [id number]:  During my exam pt c/o "my heart is racing," pulse rate 80-90's and regular. Will continue to monitor while workup progresses.  1845:  While on cardiac monitor, pt c/o palpitations to ED RN. Monitor NSR, rate 80-90's.  Labs reassuring. Will refer to Cards for Holter monitor. Pt has gotten himself dressed, is sitting on the side of his stretcher and wants to go home now. Dx and testing d/w pt.  Questions answered.  Verb understanding, agreeable to d/c home with outpt f/u.     EKG Interpretation   Date/Time:  Monday September 09 2013 14:15:12 EST Ventricular Rate:  103 PR Interval:  200 QRS Duration: 100 QT Interval:  352 QTC Calculation: 461 R Axis:   52 Text Interpretation:  Sinus tachycardia Baseline wander Incomplete right  bundle branch block No old tracing to compare Confirmed by [facility]  MD,  Nunzio Cory [phone]) on 09/09/2013 3:36:36 PM      MDM  MDM Reviewed: previous chart, nursing note and vitals Reviewed previous: labs Interpretation: labs, ECG and x-ray    Results for orders placed during the hospital encounter of 09/09/13  CBC      Result Value Ref Range   WBC 8.2  4.0 - 10.5 K/uL   RBC 5.13  4.22 - 5.81 MIL/uL   Hemoglobin 15.1  13.0 - 17.0 g/dL   HCT 44.3  39.0 - 52.0 %   MCV 86.4  78.0 - 100.0 fL   MCH 29.4  26.0 - 34.0 pg   MCHC 34.1  30.0 - 36.0 g/dL   RDW 14.1  11.5 - 15.5 %   Platelets 176  150 - 400 K/uL  BASIC METABOLIC PANEL      Result Value Ref Range   Sodium 142  137 - 147 mEq/L   Potassium 3.9  3.7 - 5.3 mEq/L   Chloride 104  96 - 112 mEq/L   CO2 22  19 - 32 mEq/L   Glucose, Bld 94  70 - 99 mg/dL   BUN 13  6 - 23 mg/dL   Creatinine, Ser 0.76  0.50 - 1.35 mg/dL   Calcium 9.5  8.4 - 10.5 mg/dL   GFR calc non Af  Amer >90  >90 mL/min   GFR calc Af Amer >90  >90 mL/min  MAGNESIUM      Result Value Ref Range   Magnesium 2.0  1.5 - 2.5 mg/dL  D-DIMER, QUANTITATIVE      Result Value Ref Range   D-Dimer, Quant <0.27  0.00 - 0.48 ug/mL-FEU  I-STAT TROPOININ, ED      Result Value Ref Range   Troponin i, poc 0.00  0.00 - 0.08 ng/mL   Comment 3  Dg Chest 2 View 09/09/2013   CLINICAL DATA:  Chest pain and tachycardia  EXAM: CHEST  2 VIEW  COMPARISON:  None available  FINDINGS: The heart size and mediastinal contours are within normal limits. Both lungs are clear. The visualized skeletal structures are unremarkable. Lower cervical fusion hardware noted.  IMPRESSION: No active cardiopulmonary disease.   Electronically Signed   By: Daryll Brod M.D.   On: 09/09/2013 15:50       Alfonzo Feller, DO 09/12/13 1341

## 2013-09-10 NOTE — Telephone Encounter (Signed)
Pt went to ED, saw notes in chart.

## 2013-09-11 ENCOUNTER — Telehealth: Payer: Self-pay | Admitting: *Deleted

## 2013-09-11 ENCOUNTER — Telehealth: Payer: Self-pay | Admitting: Internal Medicine

## 2013-09-11 NOTE — Telephone Encounter (Signed)
error 

## 2013-09-11 NOTE — Telephone Encounter (Signed)
Shane [name] called back was talking to Shane [name] regarding getting note for pt to be out of work till see cardiology on the 10th and then was disconnected. She called back and Juliann Pulse asked me to take the call.  Spoke to pt's wife Shane [name] and told her I can not give note for pt to be out of work till he sees cardiology. Shane [name] said I know that, would like an appointment. Told her but, he was told to follow up with Cardiology. Shane [name] said yes but pt is still c/o SOB and dizziness with activity. Asked her if she called cardiology for a sooner appointment ? She said yes it was already moved up from the 26 th to the 10 th. Told her okay will check with another provider about seeing him and get back to her.

## 2013-09-11 NOTE — Telephone Encounter (Signed)
Called back and spoke to pt, told him Dr. Elease Hashimoto can see him tomorrow at 4:00 pm appointment scheduled. Pt verbalized understanding. Asked pt if he is SOB now? Pt said no if I am resting I am okay, but with any activity I get SOB, dizzy and Headache. Told him okay will see you tomorrow. Pt verbalized understanding.

## 2013-09-12 ENCOUNTER — Encounter: Payer: Self-pay | Admitting: Family Medicine

## 2013-09-12 ENCOUNTER — Ambulatory Visit (INDEPENDENT_AMBULATORY_CARE_PROVIDER_SITE_OTHER): Payer: BC Managed Care – PPO | Admitting: Family Medicine

## 2013-09-12 VITALS — BP 128/78 | HR 83 | Wt 266.0 lb

## 2013-09-12 DIAGNOSIS — R0600 Dyspnea, unspecified: Secondary | ICD-10-CM

## 2013-09-12 DIAGNOSIS — R0609 Other forms of dyspnea: Secondary | ICD-10-CM

## 2013-09-12 DIAGNOSIS — R002 Palpitations: Secondary | ICD-10-CM

## 2013-09-12 DIAGNOSIS — R0989 Other specified symptoms and signs involving the circulatory and respiratory systems: Secondary | ICD-10-CM

## 2013-09-12 NOTE — Patient Instructions (Signed)
Consider baby aspirin 81 mg once daily Avoid heavy exertion until follow up with cardiology Continue to avoid caffeine. Follow up promptly for any recurrent chest pain or increased shortness of breath.

## 2013-09-12 NOTE — Progress Notes (Signed)
Subjective:  Patient is seen for emergency room followup. He presented there on 09/09/2013 with palpitations off and on for the past couple of weeks. He describes several episodes of prominent heartbeat and some associated shortness of breath and feeling anxious. These episodes occurred both at rest and occasionally with movement. No clear triggers. Episodes generally lasted 10-15 minutes. He denied any associated nausea, syncope, chest pain, cough, or pleuritic pain. No history of panic disorder.  He did have one episode last Monday where he was walking and felt a little bit of heaviness in his chest. He was concerned because mother had coronary disease at 53 he also 2 maternal uncles with coronary disease at early age.  Patient had unremarkable labs in ED. Troponins were negative. He apparently complained of sensation of heart racing a couple times when he was on monitor but had heart rate in the 80s and 90s. D-dimer was normal. Chemistries were normal. Chest x-ray no acute findings. EKG unremarkable. Patient was discharged home with recommended followup with cardiology which is pending for next week.  Since discharge from ER he has reduced caffeine intake. He has not had any recurrent episodes. He's been out of work over the past couple days. Denies any recent GERD symptoms.  Possibly some mild tremors with episodes above. No diarrhea. No weight changes.  Patient nonsmoker. No history of hypertension. No diabetes history. Does have history of low HDL  Past Medical History  Diagnosis Date  . GERD (gastroesophageal reflux disease)   . Obesity   . Chronic knee pain   . Left shoulder pain    Past Surgical History  Procedure Laterality Date  . Cervical disc surgery    . Carpal pummel repair      reports that he has never smoked. He has never used smokeless tobacco. He reports that he does not drink alcohol or use illicit drugs. family history includes Cancer in his father; Heart disease in his  mother; Hypertension in his father. Allergies  Allergen Reactions  . Penicillins Cross Reactors Rash     Objective:  Filed Vitals:   09/12/13 1546  BP: 128/78  Pulse: 83   General: Patient alert in no distress. HEENT-oropharynx clear. Eardrums normal. Neck-no mass. No thyromegaly Chest-clear to auscultation. No wheezes or rales Heart-regular rhythm and rate. No murmur. Abdomen-soft and nontender Extremities-no edema   Assessment and plan:  2 week history of intermittent palpitations associated with dyspnea. Symptoms are very inconsistent and do not consistently occur with activity. Currently asymptomatic. Risk factors include positive family history and low HDL. Pending referral to cardiology. Consider baby aspirin one daily. Avoid exertional activities until valuated by cardiology. Check TSH though doubt hyperthyroidism. Continue to avoid caffeine. Followup immediately for any recurrent symptoms

## 2013-09-12 NOTE — Progress Notes (Signed)
Pre visit review using our clinic review tool, if applicable. No additional management support is needed unless otherwise documented below in the visit note. 

## 2013-09-13 LAB — TSH: TSH: 0.66 u[IU]/mL (ref 0.35–5.50)

## 2013-09-17 ENCOUNTER — Encounter: Payer: Self-pay | Admitting: Cardiology

## 2013-09-17 ENCOUNTER — Ambulatory Visit (INDEPENDENT_AMBULATORY_CARE_PROVIDER_SITE_OTHER): Payer: BC Managed Care – PPO | Admitting: Cardiology

## 2013-09-17 VITALS — BP 130/74 | HR 90 | Ht 70.0 in | Wt 266.0 lb

## 2013-09-17 DIAGNOSIS — R002 Palpitations: Secondary | ICD-10-CM

## 2013-09-17 DIAGNOSIS — R079 Chest pain, unspecified: Secondary | ICD-10-CM | POA: Insufficient documentation

## 2013-09-17 DIAGNOSIS — R42 Dizziness and giddiness: Secondary | ICD-10-CM

## 2013-09-17 NOTE — Patient Instructions (Signed)
Your physician has requested that you have en exercise stress myoview. For further information please visit [email or web address]. Please follow instruction sheet, as given.  Your physician has recommended that you wear a 48 hour holter monitor. Holter monitors are medical devices that record the heart's electrical activity. Doctors most often use these monitors to diagnose arrhythmias. Arrhythmias are problems with the speed or rhythm of the heartbeat. The monitor is a small, portable device. You can wear one while you do your normal daily activities. This is usually used to diagnose what is causing palpitations/syncope (passing out).  Your physician recommends that you continue on your current medications as directed. Please refer to the Current Medication list given to you today.  Your physician recommends that you schedule a follow-up appointment in: after tests

## 2013-09-17 NOTE — Progress Notes (Signed)
Patient ID: HAIRO GARRAWAY, male   DOB: 10-20-1965, 48 y.o.   MRN: [id number]    Patient Name: Shane [name] Date of Encounter: 09/17/2013  Primary Care Provider:  Nyoka Cowden, MD Primary Cardiologist:  Dorothy Spark  Problem List   Past Medical History  Diagnosis Date  . GERD (gastroesophageal reflux disease)   . Obesity   . Chronic knee pain   . Left shoulder pain    Past Surgical History  Procedure Laterality Date  . Cervical disc surgery    . Carpal pummel repair      Allergies  Allergies  Allergen Reactions  . Penicillins Cross Reactors Rash    HPI  A 48 year old male with no significant prior medical history other than obesity and a reflex disease who was seen in the GERD last week for palpitations, worsening dyspnea on exertion and chest pain. The patient states that for the last year he has been noticing progressively worsening dyspnea on exertion. See in the last 2 weeks experience palpitations that can last up to a few hours and are associated with weakness, dizziness, and chest pain. Chest pain feels retrosternal pressure-like and he feels very weak. The longest episode lasted for hours. He has significant family history of coronary artery disease, his mother had a bypass involving placed at age of 11, his 2 uncles recently underwent bypass surgery, and his paternal grandfather had problems with heart as well but he doesn't know anymore details. He denies any prior syncopal episode, he has occasional lower extremity edema, no orthopnea or paroxysmal nocturnal dyspnea. The patient has never smoked and started to take aspirin a week ago.   Home Medications  Prior to Admission medications   Medication Sig Start Date End Date Taking? Authorizing Provider  Ascorbic Acid (VITAMIN C PO) Take 1 tablet by mouth daily.   Yes Historical Provider, MD  aspirin 81 MG tablet Take 81 mg by mouth daily.   Yes Historical Provider, MD    Family  History  Family History  Problem Relation Age of Onset  . Heart disease Mother     mitrial valve repair, CABG   . Cancer Father     prostate ca  . Hypertension Father     Social History  History   Social History  . Marital Status: Married    Spouse Name: N/A    Number of Children: N/A  . Years of Education: N/A   Occupational History  . Not on file.   Social History Main Topics  . Smoking status: Never Smoker   . Smokeless tobacco: Never Used  . Alcohol Use: No  . Drug Use: No  . Sexual Activity: Not on file   Other Topics Concern  . Not on file   Social History Narrative  . No narrative on file     Review of Systems, as per HPI, otherwise negative General:  No chills, fever, night sweats or weight changes.  Cardiovascular:  No chest pain, dyspnea on exertion, edema, orthopnea, palpitations, paroxysmal nocturnal dyspnea. Dermatological: No rash, lesions/masses Respiratory: No cough, dyspnea Urologic: No hematuria, dysuria Abdominal:   No nausea, vomiting, diarrhea, bright red blood per rectum, melena, or hematemesis Neurologic:  No visual changes, wkns, changes in mental status. All other systems reviewed and are otherwise negative except as noted above.  Physical Exam  Blood pressure 130/74, pulse 90, height 5\' 10"  (1.778 m), weight 266 lb (120.657 kg).  General: Pleasant, NAD Psych: Normal affect. Neuro: Alert and oriented  X 3. Moves all extremities spontaneously. HEENT: Normal  Neck: Supple without bruits or JVD. Lungs:  Resp regular and unlabored, CTA. Heart: RRR no s3, s4, or murmurs. Abdomen: Soft, non-tender, non-distended, BS + x 4.  Extremities: No clubbing, cyanosis or edema. DP/PT/Radials 2+ and equal bilaterally.  Labs:  No results found for this basename: CKTOTAL, CKMB, TROPONINI,  in the last 72 hours Lab Results  Component Value Date   WBC 8.2 09/09/2013   HGB 15.1 09/09/2013   HCT 44.3 09/09/2013   MCV 86.4 09/09/2013   PLT 176 09/09/2013    No results found for this basename: NA, K, CL, CO2, BUN, CREATININE, CALCIUM, LABALBU, PROT, BILITOT, ALKPHOS, ALT, AST, GLUCOSE,  in the last 168 hours Lab Results  Component Value Date   CHOL 174 01/07/2013   HDL 36.20* 01/07/2013   LDLCALC 123* 01/07/2013   TRIG 72.0 01/07/2013   Lab Results  Component Value Date   DDIMER <0.27 09/09/2013   No components found with this basename: POCBNP,   Accessory Clinical Findings  Echocardiogram - none  ECG - sinus rhythm, possible lateral infarct age undetermined,    Assessment & Plan  48 year old male  1. chest pain - with some typical and atypical features, associated with palpitations and shortness of breath. Considering history is factors that include obesity and significant family history of coronary artery disease and abnormal EKG suggesting possible prior lateral wall infarct we'll proceed with an exercise nuclear stress test.  2. Palpitations - associated shortness of breath and dizziness, we will schedule a 48-hour Holter monitor. All his labs including electrolytes and TSH were normal.  3. Lipids - LDL to 127, goal below 100, Bruce d improvement in his diet, however if any abnormalities on his stress test we will start statins..  Follow up in 3 weeks.   Dorothy Spark, MD, [facility] 09/17/2013, 10:19 AM

## 2013-09-24 ENCOUNTER — Encounter: Payer: Self-pay | Admitting: *Deleted

## 2013-09-24 ENCOUNTER — Encounter (INDEPENDENT_AMBULATORY_CARE_PROVIDER_SITE_OTHER): Payer: BC Managed Care – PPO

## 2013-09-24 DIAGNOSIS — R42 Dizziness and giddiness: Secondary | ICD-10-CM

## 2013-09-24 DIAGNOSIS — R002 Palpitations: Secondary | ICD-10-CM

## 2013-09-24 NOTE — Progress Notes (Signed)
Patient ID: Shane [name], male   DOB: August 28, 1965, 48 y.o.   MRN: [id number] E-Cardio 48 hour holter monitor applied to patient.

## 2013-09-30 ENCOUNTER — Encounter: Payer: Self-pay | Admitting: *Deleted

## 2013-10-01 ENCOUNTER — Telehealth: Payer: Self-pay

## 2013-10-01 NOTE — Telephone Encounter (Signed)
The pts wife, Vincente Liberty, is advised that per Dr Meda Coffee the pts holter monitor results are normal. She verbalized understanding and states that she will notify the pt of results.

## 2013-10-02 ENCOUNTER — Encounter: Payer: Self-pay | Admitting: Cardiology

## 2013-10-02 ENCOUNTER — Ambulatory Visit (HOSPITAL_COMMUNITY): Payer: BC Managed Care – PPO | Attending: Cardiology | Admitting: Radiology

## 2013-10-02 VITALS — BP 133/82 | HR 62 | Ht 70.0 in | Wt 261.0 lb

## 2013-10-02 DIAGNOSIS — R002 Palpitations: Secondary | ICD-10-CM | POA: Insufficient documentation

## 2013-10-02 DIAGNOSIS — R0989 Other specified symptoms and signs involving the circulatory and respiratory systems: Secondary | ICD-10-CM | POA: Insufficient documentation

## 2013-10-02 DIAGNOSIS — R0602 Shortness of breath: Secondary | ICD-10-CM | POA: Insufficient documentation

## 2013-10-02 DIAGNOSIS — R42 Dizziness and giddiness: Secondary | ICD-10-CM | POA: Insufficient documentation

## 2013-10-02 DIAGNOSIS — R079 Chest pain, unspecified: Secondary | ICD-10-CM

## 2013-10-02 DIAGNOSIS — R0609 Other forms of dyspnea: Secondary | ICD-10-CM | POA: Insufficient documentation

## 2013-10-02 MED ORDER — TECHNETIUM TC 99M SESTAMIBI GENERIC - CARDIOLITE
30.0000 | Freq: Once | INTRAVENOUS | Status: AC | PRN
  Administered 2013-10-02: 30 via INTRAVENOUS

## 2013-10-02 MED ORDER — TECHNETIUM TC 99M SESTAMIBI GENERIC - CARDIOLITE
10.0000 | Freq: Once | INTRAVENOUS | Status: AC | PRN
  Administered 2013-10-02: 10 via INTRAVENOUS

## 2013-10-02 NOTE — Progress Notes (Signed)
[facility] [address] [place], [place] [address] [phone]    Cardiology Nuclear Med Study  Shane [name] is a 48 y.o. male     MRN : [id number]     DOB: [birth year]  Procedure Date: 10/02/2013  Nuclear Med Background Indication for Stress Test:  Evaluation for Ischemia, and Patient seen in hospital on 09-2013 for Chest Pain, SOB, enzymes negative History:  No known CAD Cardiac Risk Factors: Premature Family History - CAD  Symptoms:  Chest Pain (last date of chest discomfort was Sunday), Dizziness, DOE, Palpitations and SOB   Nuclear Pre-Procedure Caffeine/Decaff Intake:  None > 12 hrs NPO After: 10:00pm   Lungs:  clear O2 Sat: 98% on room air. IV 0.9% NS with Angio Cath:  22g  IV Site: R Antecubital x 1, tolerated well IV Started by:  Irven Baltimore, RN  Chest Size (in):  48 Cup Size: n/a  Height: 5\' 10"  (1.778 m)  Weight:  261 lb (118.389 kg)  BMI:  Body mass index is 37.45 kg/(m^2). Tech Comments:  N/A    Nuclear Med Study 1 or 2 day study: 1 day  Stress Test Type:  Stress  Reading MD: N/A  Order Authorizing Provider:  Ena Dawley, MD  Resting Radionuclide: Technetium 69m Sestamibi  Resting Radionuclide Dose: 11.0 mCi   Stress Radionuclide:  Technetium 24m Sestamibi  Stress Radionuclide Dose: 33.0 mCi           Stress Protocol Rest HR: 62 Stress HR: 153  Rest BP: 133/82 Stress BP: 211/107  Exercise Time (min): 7:30 METS: 9.3           Dose of Adenosine (mg):  n/a Dose of Lexiscan: n/a mg  Dose of Atropine (mg): n/a Dose of Dobutamine: n/a mcg/kg/min (at max HR)  Stress Test Technologist: Glade Lloyd, BS-ES  Nuclear Technologist:  Charlton Amor, CNMT     Rest Procedure:  Myocardial perfusion imaging was performed at rest 45 minutes following the intravenous administration of Technetium 47m Sestamibi. Rest ECG: NSR - Normal EKG  Stress Procedure:  The patient exercised on the treadmill utilizing the Bruce Protocol  for 7:30 minutes. The patient stopped due to SOB, fatigue and denied any chest pain.  Technetium 38m Sestamibi was injected at peak exercise and myocardial perfusion imaging was performed after a brief delay. Stress ECG: No significant change from baseline ECG  QPS Raw Data Images:  There is interference from nuclear activity from structures below the diaphragm. This does not affect the ability to read the study. Stress Images:  There is decreased uptake in the inferior wall. Rest Images:  There is decreased uptake in the inferior wall. Subtraction (SDS):  There is a fixed inferior defect that is most consistent with diaphragmatic attenuation. Transient Ischemic Dilatation (Normal <1.22):  0.90 Lung/Heart Ratio (Normal <0.45):  0.28  Quantitative Gated Spect Images QGS EDV:  146 ml QGS ESV:  59 ml  Impression Exercise Capacity:  Good exercise capacity. BP Response:  Hypertensive blood pressure response. Clinical Symptoms:  There is dyspnea. ECG Impression:  No significant ST segment change suggestive of ischemia. Comparison with Prior Nuclear Study: No previous nuclear study performed  Overall Impression:  Low risk stress nuclear study with small area of inferior bowel attenuation artifact.  LV Ejection Fraction: 59%.  LV Wall Motion:  Normal Wall Motion  Pixie Casino, MD, [facility] Board Certified in Nuclear Cardiology Attending Cardiologist [place]

## 2013-10-03 ENCOUNTER — Ambulatory Visit: Payer: BC Managed Care – PPO | Admitting: Cardiology

## 2013-10-07 ENCOUNTER — Encounter: Payer: Self-pay | Admitting: Cardiology

## 2013-10-07 ENCOUNTER — Ambulatory Visit (INDEPENDENT_AMBULATORY_CARE_PROVIDER_SITE_OTHER): Payer: BC Managed Care – PPO | Admitting: Cardiology

## 2013-10-07 VITALS — BP 134/86 | HR 72 | Ht 70.0 in | Wt 268.0 lb

## 2013-10-07 DIAGNOSIS — I10 Essential (primary) hypertension: Secondary | ICD-10-CM | POA: Insufficient documentation

## 2013-10-07 MED ORDER — AMLODIPINE BESYLATE 2.5 MG PO TABS: 2.5000 mg | ORAL_TABLET | Freq: Every day | ORAL | Status: DC

## 2013-10-07 NOTE — Patient Instructions (Signed)
Your physician has recommended you make the following change in your medication: start taking Amlodipine 2.5 mg daily  Your physician wants you to follow-up in: 1 year. You will receive a reminder letter in the mail two months in advance. If you don't receive a letter, please call our office to schedule the follow-up appointment.

## 2013-10-07 NOTE — Progress Notes (Signed)
Patient ID: BURNS TIMSON, male   DOB: 10-Jun-1966, 48 y.o.   MRN: [id number]    Patient Name: Shane [name] Date of Encounter: 10/07/2013  Primary Care Provider:  Nyoka Cowden, MD Primary Cardiologist:  Dorothy Spark  Problem List   Past Medical History  Diagnosis Date  . GERD (gastroesophageal reflux disease)   . Obesity   . Chronic knee pain   . Left shoulder pain    Past Surgical History  Procedure Laterality Date  . Cervical disc surgery    . Carpal pummel repair      Allergies  Allergies  Allergen Reactions  . Penicillins Cross Reactors Rash    HPI  A 48 year old male with no significant prior medical history other than obesity and a reflex disease who was seen in the GERD last week for palpitations, worsening dyspnea on exertion and chest pain. The patient states that for the last year he has been noticing progressively worsening dyspnea on exertion. See in the last 2 weeks experience palpitations that can last up to a few hours and are associated with weakness, dizziness, and chest pain. Chest pain feels retrosternal pressure-like and he feels very weak. The longest episode lasted for hours. He has significant family history of coronary artery disease, his mother had a bypass involving placed at age of 21, his 2 uncles recently underwent bypass surgery, and his paternal grandfather had problems with heart as well but he doesn't know anymore details. He denies any prior syncopal episode, he has occasional lower extremity edema, no orthopnea or paroxysmal nocturnal dyspnea. The patient has never smoked and started to take aspirin a week ago.   Home Medications  Prior to Admission medications   Medication Sig Start Date End Date Taking? Authorizing Provider  Ascorbic Acid (VITAMIN C PO) Take 1 tablet by mouth daily.   Yes Historical Provider, MD  aspirin 81 MG tablet Take 81 mg by mouth daily.   Yes Historical Provider, MD    Family  History  Family History  Problem Relation Age of Onset  . Heart disease Mother     mitrial valve repair, CABG   . Prostate cancer Father   . Hypertension Father     Social History  History   Social History  . Marital Status: Married    Spouse Name: N/A    Number of Children: N/A  . Years of Education: N/A   Occupational History  . Not on file.   Social History Main Topics  . Smoking status: Never Smoker   . Smokeless tobacco: Never Used  . Alcohol Use: No  . Drug Use: No  . Sexual Activity: Not on file   Other Topics Concern  . Not on file   Social History Narrative  . No narrative on file     Review of Systems, as per HPI, otherwise negative General:  No chills, fever, night sweats or weight changes.  Cardiovascular:  No chest pain, dyspnea on exertion, edema, orthopnea, palpitations, paroxysmal nocturnal dyspnea. Dermatological: No rash, lesions/masses Respiratory: No cough, dyspnea Urologic: No hematuria, dysuria Abdominal:   No nausea, vomiting, diarrhea, bright red blood per rectum, melena, or hematemesis Neurologic:  No visual changes, wkns, changes in mental status. All other systems reviewed and are otherwise negative except as noted above.  Physical Exam  Blood pressure 134/86, pulse 72, height 5\' 10"  (1.778 m), weight 268 lb (121.564 kg).  General: Pleasant, NAD Psych: Normal affect. Neuro: Alert and oriented X 3. Moves all  extremities spontaneously. HEENT: Normal  Neck: Supple without bruits or JVD. Lungs:  Resp regular and unlabored, CTA. Heart: RRR no s3, s4, or murmurs. Abdomen: Soft, non-tender, non-distended, BS + x 4.  Extremities: No clubbing, cyanosis or edema. DP/PT/Radials 2+ and equal bilaterally.  Labs:  No results found for this basename: CKTOTAL, CKMB, TROPONINI,  in the last 72 hours Lab Results  Component Value Date   WBC 8.2 09/09/2013   HGB 15.1 09/09/2013   HCT 44.3 09/09/2013   MCV 86.4 09/09/2013   PLT 176 09/09/2013   No  results found for this basename: NA, K, CL, CO2, BUN, CREATININE, CALCIUM, LABALBU, PROT, BILITOT, ALKPHOS, ALT, AST, GLUCOSE,  in the last 168 hours Lab Results  Component Value Date   CHOL 174 01/07/2013   HDL 36.20* 01/07/2013   LDLCALC 123* 01/07/2013   TRIG 72.0 01/07/2013   Lab Results  Component Value Date   DDIMER <0.27 09/09/2013   No components found with this basename: POCBNP,   Accessory Clinical Findings  Echocardiogram - none  ECG - sinus rhythm, possible lateral infarct age undetermined,  Exercise nuclear stress test: 10/03/2013 Quantitative Gated Spect Images  QGS EDV: 146 ml  QGS ESV: 59 ml  Impression  Exercise Capacity: Good exercise capacity.  BP Response: Hypertensive blood pressure response.  Clinical Symptoms: There is dyspnea.  ECG Impression: No significant ST segment change suggestive of ischemia.  Comparison with Prior Nuclear Study: No previous nuclear study performed  Overall Impression: Low risk stress nuclear study with small area of inferior bowel attenuation artifact.  LV Ejection Fraction: 59%. LV Wall Motion: Normal Wall Motion   Assessment & Plan  48 year old male  1. chest pain - with some typical and atypical features, associated with palpitations and shortness of breath. Negative exercise stress test for scar or ischemia, good exercise capacity, byt hypertensive response to stress 211 mmHg.  We will start amlodipine 2.5 mg po daily.  2. Hypertension - on exertion - we will start amlodipine 2.5 mg po daily.  3. Palpitations - associated shortness of breath and dizziness, we will schedule a 48-hour Holter monitor. All his labs including electrolytes and TSH were normal. Normal 48 Hour Holter, no correlation with his symptoms in Holter diary.   3. Lipids - LDL to 127, goal below 100, there is room for improvement in his diet, we will recheck in 1 year.   Follow up in 1 year.    Dorothy Spark, MD, [facility] 10/07/2013, 10:18 AM

## 2013-10-14 ENCOUNTER — Telehealth: Payer: Self-pay | Admitting: Cardiology

## 2013-10-14 NOTE — Telephone Encounter (Signed)
Left pt a message to call back. 

## 2013-10-14 NOTE — Telephone Encounter (Signed)
Returned call to patient he wanted to know if he needs to keep taking aspirin 81 mg daily.Message sent to [place] for advice.

## 2013-10-14 NOTE — Telephone Encounter (Signed)
He doesn't have to, thank you, K

## 2013-10-14 NOTE — Telephone Encounter (Signed)
New message     Can pt stop taking aspirin daily?  OK to leave a msg on vm.

## 2013-10-14 NOTE — Telephone Encounter (Signed)
Returned call to patient Dr.Nelson advised does not have to take aspirin.

## 2013-10-21 ENCOUNTER — Other Ambulatory Visit: Payer: Self-pay

## 2013-11-06 ENCOUNTER — Telehealth: Payer: Self-pay | Admitting: Cardiology

## 2013-11-06 NOTE — Telephone Encounter (Signed)
Called [facility] about needing Dr Meda Coffee to complete disability forms completed on this pt.  Spoke with Katharine Look at [facility]. She states been trying to contact this office for 2 weeks about this. Also stated she faxed disability papers 2 weeks ago as well and nobody responded from medical records. Told her to call [phone] on 4/30 and ask for med records about this.  Also told her to fax papers to [phone] attn med records for Dr Meda Coffee. Katharine Look stated she thinks she has been faxing this to the wrong number.  Will send med records a staff message on this.

## 2013-11-06 NOTE — Telephone Encounter (Signed)
New message      Need doctor to complete dates of disability on form faxed yesterday.  Please fax to [phone]

## 2013-11-14 NOTE — Telephone Encounter (Signed)
Follow up     Still have not received disability papers.  Were you able to get Dr Meda Coffee to sign the papers?

## 2013-11-14 NOTE — Telephone Encounter (Signed)
Spoke with Shane [name] at [employer] and states she still has not got fax from our medical records department concerning pts disability. Told her med records is out of office and will return tomorrow to call [phone] and advise them with this matter so info can be faxed.  Told her I will follow up with medical records on this matter on 5/8. Shane [name] verbalized understanding and pleased with call back.

## 2013-11-15 NOTE — Telephone Encounter (Signed)
Medical records faxed disability papers to [facility] at [place] today.

## 2013-11-22 ENCOUNTER — Telehealth: Payer: Self-pay | Admitting: Cardiology

## 2013-11-22 NOTE — Telephone Encounter (Signed)
[employer] papers faxed to [place] [facility] [employer] @ [phone]

## 2014-01-24 ENCOUNTER — Ambulatory Visit (INDEPENDENT_AMBULATORY_CARE_PROVIDER_SITE_OTHER): Payer: BC Managed Care – PPO | Admitting: Internal Medicine

## 2014-01-24 ENCOUNTER — Encounter: Payer: Self-pay | Admitting: Internal Medicine

## 2014-01-24 ENCOUNTER — Telehealth: Payer: Self-pay | Admitting: Internal Medicine

## 2014-01-24 VITALS — BP 126/80 | HR 74 | Temp 98.3°F | Resp 20 | Ht 70.0 in | Wt 262.0 lb

## 2014-01-24 DIAGNOSIS — N451 Epididymitis: Secondary | ICD-10-CM

## 2014-01-24 DIAGNOSIS — N453 Epididymo-orchitis: Secondary | ICD-10-CM

## 2014-01-24 MED ORDER — CIPROFLOXACIN HCL 500 MG PO TABS: 500.0000 mg | ORAL_TABLET | Freq: Two times a day (BID) | ORAL | Status: DC

## 2014-01-24 NOTE — Progress Notes (Signed)
Pre visit review using our clinic review tool, if applicable. No additional management support is needed unless otherwise documented below in the visit note. 

## 2014-01-24 NOTE — Telephone Encounter (Signed)
Noted  

## 2014-01-24 NOTE — Progress Notes (Signed)
Subjective:    Patient ID: Shane [name], male    DOB: 10-Aug-1965, 48 y.o.   MRN: [id number]  HPI 48 year old patient who has treated hypertension.  Approximately 12 years ago.  He was seen by urology and according to the patient has a history of "testicular cysts".  He developed an infection involving the left testicle.  He required antibiotic therapy. Over the past 7 days.  He has developed general sense of unwellness with fatigue and a sense of fever.  He has had some worsening left testicular pain.  No documented fever. He is married and a monogamous relationship.  No urethral discharge  Past Medical History  Diagnosis Date  . GERD (gastroesophageal reflux disease)   . Obesity   . Chronic knee pain   . Left shoulder pain     History   Social History  . Marital Status: Married    Spouse Name: N/A    Number of Children: N/A  . Years of Education: N/A   Occupational History  . Not on file.   Social History Main Topics  . Smoking status: Never Smoker   . Smokeless tobacco: Never Used  . Alcohol Use: No  . Drug Use: No  . Sexual Activity: Not on file   Other Topics Concern  . Not on file   Social History Narrative  . No narrative on file    Past Surgical History  Procedure Laterality Date  . Cervical disc surgery    . Carpal pummel repair      Family History  Problem Relation Age of Onset  . Heart disease Mother     mitrial valve repair, CABG   . Prostate cancer Father   . Hypertension Father     Allergies  Allergen Reactions  . Penicillins Cross Reactors Rash    Current Outpatient Prescriptions on File Prior to Visit  Medication Sig Dispense Refill  . amLODipine (NORVASC) 2.5 MG tablet Take 1 tablet (2.5 mg total) by mouth daily.  30 tablet  11  . Ascorbic Acid (VITAMIN C PO) Take 1 tablet by mouth daily.       No current facility-administered medications on file prior to visit.    BP 126/80  Pulse 74  Temp(Src) 98.3 F (36.8 C) (Oral)   Resp 20  Ht 5\' 10"  (1.778 m)  Wt 262 lb (118.842 kg)  BMI 37.59 kg/m2  SpO2 98%      Review of Systems  Constitutional: Positive for fatigue. Negative for fever, chills and appetite change.  HENT: Positive for congestion. Negative for dental problem, ear pain, hearing loss, sore throat, tinnitus, trouble swallowing and voice change.   Eyes: Negative for pain, discharge and visual disturbance.  Respiratory: Negative for cough, chest tightness, wheezing and stridor.   Cardiovascular: Negative for chest pain, palpitations and leg swelling.  Gastrointestinal: Negative for nausea, vomiting, abdominal pain, diarrhea, constipation, blood in stool and abdominal distention.  Genitourinary: Positive for scrotal swelling and testicular pain. Negative for urgency, hematuria, flank pain, discharge, difficulty urinating and genital sores.  Musculoskeletal: Negative for arthralgias, back pain, gait problem, joint swelling, myalgias and neck stiffness.  Skin: Negative for rash.  Neurological: Negative for dizziness, syncope, speech difficulty, weakness, numbness and headaches.  Hematological: Negative for adenopathy. Does not bruise/bleed easily.  Psychiatric/Behavioral: Negative for behavioral problems and dysphoric mood. The patient is not nervous/anxious.        Objective:   Physical Exam  Constitutional: He is oriented to person, place, and time.  He appears well-developed.  HENT:  Head: Normocephalic.  Right Ear: External ear normal.  Left Ear: External ear normal.  Eyes: Conjunctivae and EOM are normal.  Neck: Normal range of motion.  Cardiovascular: Normal rate and normal heart sounds.   Pulmonary/Chest: Breath sounds normal.  Abdominal: Bowel sounds are normal.  Genitourinary:  Left testicle was slightly enlarged and tender.  The left epididymis was indurated and also slightly tender  Musculoskeletal: Normal range of motion. He exhibits no edema and no tenderness.  Neurological: He is  alert and oriented to person, place, and time.  Psychiatric: He has a normal mood and affect. His behavior is normal.          Assessment & Plan:   Left epididymitis.  We'll treat with Cipro for 7 days Hypertension stable

## 2014-01-24 NOTE — Patient Instructions (Addendum)
Take your antibiotic as prescribed until ALL of it is gone, but stop if you develop a rash, swelling, or any side effects of the medication.  Contact our office as soon as possible if  there are side effects of the medication.Epididymitis Epididymitis is a swelling (inflammation) of the epididymis. The epididymis is a cord-like structure along the back part of the testicle. Epididymitis is usually, but not always, caused by infection. This is usually a sudden problem beginning with chills, fever and pain behind the scrotum and in the testicle. There may be swelling and redness of the testicle. DIAGNOSIS  Physical examination will reveal a tender, swollen epididymis. Sometimes, cultures are obtained from the urine or from prostate secretions to help find out if there is an infection or if the cause is a different problem. Sometimes, blood work is performed to see if your white blood cell count is elevated and if a germ (bacterial) or viral infection is present. Using this knowledge, an appropriate medicine which kills germs (antibiotic) can be chosen by your caregiver. A viral infection causing epididymitis will most often go away (resolve) without treatment. HOME CARE INSTRUCTIONS   Hot sitz baths for 20 minutes, 4 times per day, may help relieve pain.  Only take over-the-counter or prescription medicines for pain, discomfort or fever as directed by your caregiver.  Take all medicines, including antibiotics, as directed. Take the antibiotics for the full prescribed length of time even if you are feeling better.  It is very important to keep all follow-up appointments. SEEK IMMEDIATE MEDICAL CARE IF:   You have a fever.  You have pain not relieved with medicines.  You have any worsening of your problems.  Your pain seems to come and go.  You develop pain, redness, and swelling in the scrotum and surrounding areas. MAKE SURE YOU:   Understand these instructions.  Will watch your  condition.  Will get help right away if you are not doing well or get worse. Document Released: 06/24/2000 Document Revised: 09/19/2011 Document Reviewed: 05/14/2009 [facility] Patient Information 2015 [place], [place]. This information is not intended to replace advice given to you by your health care provider. Make sure you discuss any questions you have with your health care provider.

## 2014-01-24 NOTE — Telephone Encounter (Signed)
Patient Information:  Caller Name: Adriene  Phone: [phone]  Patient: Shane [name], Shane [name]  Gender: Male  DOB: 08-11-1965  Age: 48 Years  PCP: Bluford Kaufmann (Family Practice > 71yrs old)  Office Follow Up:  Does the office need to follow up with this patient?: No  Instructions For The Office: N/A  RN Note:  Wife calling regarding spouse/Eliseo c/o fatigue, dizziness, and changes in scrotum.  States "He has had symptoms like this before and diagnosed with "Cyst in scrotum."  Symptoms  Reason For Call & Symptoms: weakness, fatigue, ringing in ears  Reviewed Health History In EMR: Yes  Reviewed Medications In EMR: Yes  Reviewed Allergies In EMR: Yes  Reviewed Surgeries / Procedures: Yes  Date of Onset of Symptoms: 01/23/2014  Guideline(s) Used:  Penis and Scrotum Symptoms  Disposition Per Guideline:   See Today in Office  Reason For Disposition Reached:   Patient wants to be seen  Advice Given:  Call Back If:  You become worse.  Patient Will Follow Care Advice:  YES  Appointment Scheduled:  01/24/2014 16:00:00 Appointment Scheduled Provider:  Bluford Kaufmann (Family Practice > 32yrs old)

## 2014-04-18 ENCOUNTER — Ambulatory Visit (INDEPENDENT_AMBULATORY_CARE_PROVIDER_SITE_OTHER): Payer: BC Managed Care – PPO | Admitting: Internal Medicine

## 2014-04-18 ENCOUNTER — Encounter: Payer: Self-pay | Admitting: Internal Medicine

## 2014-04-18 VITALS — BP 104/62 | Temp 98.6°F | Ht 70.0 in | Wt 256.0 lb

## 2014-04-18 DIAGNOSIS — I10 Essential (primary) hypertension: Secondary | ICD-10-CM

## 2014-04-18 DIAGNOSIS — S39011A Strain of muscle, fascia and tendon of abdomen, initial encounter: Secondary | ICD-10-CM

## 2014-04-18 DIAGNOSIS — E669 Obesity, unspecified: Secondary | ICD-10-CM

## 2014-04-18 DIAGNOSIS — S76219A Strain of adductor muscle, fascia and tendon of unspecified thigh, initial encounter: Secondary | ICD-10-CM

## 2014-04-18 MED ORDER — AMLODIPINE BESYLATE 2.5 MG PO TABS: 2.5000 mg | ORAL_TABLET | Freq: Every day | ORAL | Status: DC

## 2014-04-18 MED ORDER — MELOXICAM 15 MG PO TABS: 15.0000 mg | ORAL_TABLET | Freq: Every day | ORAL | Status: DC

## 2014-04-18 NOTE — Progress Notes (Signed)
   Subjective:    Patient ID: Shane [name], male    DOB: 1966/01/21, 48 y.o.   MRN: [id number]  HPI  48 year old patient, who presents with a chief complaint of left medial thigh pain.  There's been no apparent injury.  Symptoms have been present for about 3 weeks.  Symptoms are aggravated by activities and alleviated by rest. He has treated hypertension, which has been controlled on amlodipine. No other concerns or complaints  Past Medical History  Diagnosis Date  . GERD (gastroesophageal reflux disease)   . Obesity   . Chronic knee pain   . Left shoulder pain     History   Social History  . Marital Status: Married    Spouse Name: N/A    Number of Children: N/A  . Years of Education: N/A   Occupational History  . Not on file.   Social History Main Topics  . Smoking status: Never Smoker   . Smokeless tobacco: Never Used  . Alcohol Use: No  . Drug Use: No  . Sexual Activity: Not on file   Other Topics Concern  . Not on file   Social History Narrative  . No narrative on file    Past Surgical History  Procedure Laterality Date  . Cervical disc surgery    . Carpal pummel repair      Family History  Problem Relation Age of Onset  . Heart disease Mother     mitrial valve repair, CABG   . Prostate cancer Father   . Hypertension Father     Allergies  Allergen Reactions  . Penicillins Cross Reactors Rash    No current outpatient prescriptions on file prior to visit.   No current facility-administered medications on file prior to visit.    BP 104/62  Temp(Src) 98.6 F (37 C) (Oral)  Ht 5\' 10"  (1.778 m)  Wt 256 lb (116.121 kg)  BMI 36.73 kg/m2     .b  Review of Systems  Constitutional: Negative for fever, chills, appetite change and fatigue.  HENT: Negative for congestion, dental problem, ear pain, hearing loss, sore throat, tinnitus, trouble swallowing and voice change.   Eyes: Negative for pain, discharge and visual disturbance.    Respiratory: Negative for cough, chest tightness, wheezing and stridor.   Cardiovascular: Negative for chest pain, palpitations and leg swelling.  Gastrointestinal: Negative for nausea, vomiting, abdominal pain, diarrhea, constipation, blood in stool and abdominal distention.  Genitourinary: Negative for urgency, hematuria, flank pain, discharge, difficulty urinating and genital sores.  Musculoskeletal: Positive for myalgias. Negative for arthralgias, back pain, gait problem, joint swelling and neck stiffness.  Skin: Negative for rash.  Neurological: Negative for dizziness, syncope, speech difficulty, weakness, numbness and headaches.  Hematological: Negative for adenopathy. Does not bruise/bleed easily.  Psychiatric/Behavioral: Negative for behavioral problems and dysphoric mood. The patient is not nervous/anxious.        Objective:   Physical Exam  Constitutional: He appears well-developed and well-nourished. No distress.  Weight 256 Blood pressure 110/70  Musculoskeletal:  Slight tenderness in the left proximal medial thigh area.  Pain is aggravated by rotation of the left hip  Genitourinary exam is normal.  No testicular pain or hernia          Assessment & Plan:   Left medial thigh pain.  We'll try gentle stretching and range of motion activities.  We'll treat with anti-inflammatories and short-term.  We'll call if unimproved Hypertension well controlled Exogenous obesity  Schedule CPX

## 2014-04-18 NOTE — Progress Notes (Signed)
Pre visit review using our clinic review tool, if applicable. No additional management support is needed unless otherwise documented below in the visit note. 

## 2014-04-18 NOTE — Patient Instructions (Signed)
You  may move around, but avoid painful motions and activities.    Meloxicam 1 daily

## 2014-04-21 ENCOUNTER — Telehealth: Payer: Self-pay | Admitting: Internal Medicine

## 2014-04-21 NOTE — Telephone Encounter (Signed)
emmi mailed  °

## 2014-05-27 ENCOUNTER — Encounter: Payer: Self-pay | Admitting: Internal Medicine

## 2014-05-27 ENCOUNTER — Ambulatory Visit (INDEPENDENT_AMBULATORY_CARE_PROVIDER_SITE_OTHER): Payer: BC Managed Care – PPO | Admitting: Internal Medicine

## 2014-05-27 DIAGNOSIS — Z23 Encounter for immunization: Secondary | ICD-10-CM

## 2014-05-27 DIAGNOSIS — R1032 Left lower quadrant pain: Secondary | ICD-10-CM

## 2014-05-27 MED ORDER — METHYLPREDNISOLONE ACETATE 80 MG/ML IJ SUSP
80.0000 mg | Freq: Once | INTRAMUSCULAR | Status: AC
  Administered 2014-05-27: 80 mg via INTRAMUSCULAR

## 2014-05-27 NOTE — Progress Notes (Signed)
   Subjective:    Patient ID: Shane [name], male    DOB: 10-Nov-1965, 48 y.o.   MRN: [id number]  HPI 48 year old patient who was seen about 5 weeks ago with a three-week history of pain in the left groin area.  This was felt to be more of a soft tissue problem and he was treated with Mobic with some benefit.  He continues to have some discomfort in the left medial groin.  It is aggravated by activities, especially  flexion of the left hip.  No history of trauma.  He is also noticed some discomfort in the left buttock area.  He is quite active at work, Investment banker, operational, and other physical activities.  Pain is often bothersome at night, but usually when he gets in a comfortable position and falls asleep.  He is not reawakened..  Past Medical History  Diagnosis Date  . GERD (gastroesophageal reflux disease)   . Obesity   . Chronic knee pain   . Left shoulder pain     History   Social History  . Marital Status: Married    Spouse Name: N/A    Number of Children: N/A  . Years of Education: N/A   Occupational History  . Not on file.   Social History Main Topics  . Smoking status: Never Smoker   . Smokeless tobacco: Never Used  . Alcohol Use: No  . Drug Use: No  . Sexual Activity: Not on file   Other Topics Concern  . Not on file   Social History Narrative    Past Surgical History  Procedure Laterality Date  . Cervical disc surgery    . Carpal pummel repair      Family History  Problem Relation Age of Onset  . Heart disease Mother     mitrial valve repair, CABG   . Prostate cancer Father   . Hypertension Father     Allergies  Allergen Reactions  . Penicillins Cross Reactors Rash    Current Outpatient Prescriptions on File Prior to Visit  Medication Sig Dispense Refill  . amLODipine (NORVASC) 2.5 MG tablet Take 1 tablet (2.5 mg total) by mouth daily. 30 tablet 11   No current facility-administered medications on file prior to visit.    BP 130/78 mmHg  Pulse  75  Temp(Src) 98.3 F (36.8 C) (Oral)  Resp 20  Ht 5\' 10"  (1.778 m)  Wt 261 lb (118.389 kg)  BMI 37.45 kg/m2  SpO2 98%      Review of Systems  Musculoskeletal:       Pain left medial thigh       Objective:   Physical Exam  Constitutional: He appears well-developed and well-nourished. No distress.  Normal gait No apparent distress  Musculoskeletal:  Straight leg test on the left does tend to cause mild discomfort in the left medial thigh Range of motion of the left hip also causes discomfort No tenderness over the greater trochanter          Assessment & Plan:   Persistent left groin pain.  Still they be musculoligamentous and refractory to treatment due to active lifestyle.  Does seem to be responsive to anti-inflammatory medication.  Will treat with Depo-Medrol and observe for at least a few days.  If not improved, will consider orthopedic evaluation or imaging studies

## 2014-05-27 NOTE — Patient Instructions (Signed)
Call in a few days if unimproved for x-rays or orthopedic evaluation  You  may move around, but avoid painful motions and activities.

## 2014-05-27 NOTE — Progress Notes (Signed)
Pre visit review using our clinic review tool, if applicable. No additional management support is needed unless otherwise documented below in the visit note. 

## 2014-06-02 ENCOUNTER — Telehealth: Payer: Self-pay | Admitting: Internal Medicine

## 2014-06-02 DIAGNOSIS — R1032 Left lower quadrant pain: Secondary | ICD-10-CM

## 2014-06-02 NOTE — Telephone Encounter (Signed)
Orthopedic referral.

## 2014-06-02 NOTE — Telephone Encounter (Signed)
Spoke to pt's wife told her order for Ortho referral was done and someone will contact them regarding an appt.

## 2014-06-02 NOTE — Telephone Encounter (Signed)
Wife states pt is not better after the injection in his leg.  Pt went to work and is in a lot of pain.  Wife states dr Raliegh Ip may want to do referral if inj did not work.  pls call wife since pt at work.

## 2014-06-02 NOTE — Telephone Encounter (Signed)
Please advise 

## 2014-07-07 ENCOUNTER — Other Ambulatory Visit (INDEPENDENT_AMBULATORY_CARE_PROVIDER_SITE_OTHER): Payer: BC Managed Care – PPO

## 2014-07-07 DIAGNOSIS — Z0000 Encounter for general adult medical examination without abnormal findings: Secondary | ICD-10-CM

## 2014-07-07 LAB — BASIC METABOLIC PANEL
BUN: 15 mg/dL (ref 6–23)
CALCIUM: 9 mg/dL (ref 8.4–10.5)
CO2: 29 mEq/L (ref 19–32)
CREATININE: 1 mg/dL (ref 0.4–1.5)
Chloride: 106 mEq/L (ref 96–112)
GFR: 89.99 mL/min (ref >60.00)
GLUCOSE: 87 mg/dL (ref 70–99)
POTASSIUM: 4 meq/L (ref 3.5–5.1)
Sodium: 140 mEq/L (ref 135–145)

## 2014-07-07 LAB — TSH: TSH: 2.52 u[IU]/mL (ref 0.35–4.50)

## 2014-07-07 LAB — POCT URINALYSIS DIPSTICK
BILIRUBIN UA: NEGATIVE
Blood, UA: NEGATIVE
Glucose, UA: NEGATIVE
Ketones, UA: NEGATIVE
Leukocytes, UA: NEGATIVE
NITRITE UA: NEGATIVE
Protein, UA: NEGATIVE
Spec Grav, UA: 1.02
UROBILINOGEN UA: 1
pH, UA: 8.5

## 2014-07-07 LAB — LIPID PANEL
CHOLESTEROL: 185 mg/dL (ref 0–200)
HDL: 38 mg/dL — ABNORMAL LOW (ref >39.00)
LDL Cholesterol: 129 mg/dL — ABNORMAL HIGH (ref 0–99)
NonHDL: 147
TRIGLYCERIDES: 88 mg/dL (ref 0.0–149.0)
Total CHOL/HDL Ratio: 5
VLDL: 17.6 mg/dL (ref 0.0–40.0)

## 2014-07-07 LAB — CBC WITH DIFFERENTIAL/PLATELET
BASOS ABS: 0 10*3/uL (ref 0.0–0.1)
Basophils Relative: 0.5 % (ref 0.0–3.0)
EOS PCT: 1 % (ref 0.0–5.0)
Eosinophils Absolute: 0.1 10*3/uL (ref 0.0–0.7)
HCT: 44.4 % (ref 39.0–52.0)
Hemoglobin: 14.6 g/dL (ref 13.0–17.0)
LYMPHS PCT: 29.3 % (ref 12.0–46.0)
Lymphs Abs: 2.3 10*3/uL (ref 0.7–4.0)
MCHC: 32.8 g/dL (ref 30.0–36.0)
MCV: 86.2 fl (ref 78.0–100.0)
MONOS PCT: 8.4 % (ref 3.0–12.0)
Monocytes Absolute: 0.7 10*3/uL (ref 0.1–1.0)
Neutro Abs: 4.9 10*3/uL (ref 1.4–7.7)
Neutrophils Relative %: 60.8 % (ref 43.0–77.0)
Platelets: 179 10*3/uL (ref 150.0–400.0)
RBC: 5.15 Mil/uL (ref 4.22–5.81)
RDW: 15.1 % (ref 11.5–15.5)
WBC: 8 10*3/uL (ref 4.0–10.5)

## 2014-07-07 LAB — HEPATIC FUNCTION PANEL
ALK PHOS: 55 U/L (ref 39–117)
ALT: 19 U/L (ref 0–53)
AST: 16 U/L (ref 0–37)
Albumin: 4 g/dL (ref 3.5–5.2)
BILIRUBIN DIRECT: 0 mg/dL (ref 0.0–0.3)
TOTAL PROTEIN: 7.1 g/dL (ref 6.0–8.3)
Total Bilirubin: 0.6 mg/dL (ref 0.2–1.2)

## 2014-07-15 ENCOUNTER — Ambulatory Visit (INDEPENDENT_AMBULATORY_CARE_PROVIDER_SITE_OTHER): Payer: BLUE CROSS/BLUE SHIELD | Admitting: Internal Medicine

## 2014-07-15 ENCOUNTER — Encounter: Payer: Self-pay | Admitting: Internal Medicine

## 2014-07-15 VITALS — BP 122/76 | HR 74 | Temp 98.3°F | Resp 20 | Ht 68.75 in | Wt 263.0 lb

## 2014-07-15 DIAGNOSIS — E669 Obesity, unspecified: Secondary | ICD-10-CM

## 2014-07-15 DIAGNOSIS — K219 Gastro-esophageal reflux disease without esophagitis: Secondary | ICD-10-CM

## 2014-07-15 DIAGNOSIS — Z0000 Encounter for general adult medical examination without abnormal findings: Secondary | ICD-10-CM

## 2014-07-15 DIAGNOSIS — I10 Essential (primary) hypertension: Secondary | ICD-10-CM

## 2014-07-15 MED ORDER — AMLODIPINE BESYLATE 2.5 MG PO TABS: 2.5000 mg | ORAL_TABLET | Freq: Every day | ORAL | Status: DC

## 2014-07-15 NOTE — Progress Notes (Signed)
Pre visit review using our clinic review tool, if applicable. No additional management support is needed unless otherwise documented below in the visit note. 

## 2014-07-15 NOTE — Progress Notes (Signed)
   Subjective:    Patient ID: Shane [name], male    DOB: 08-Apr-1966, 49 y.o.   MRN: [id number]  HPI  BP Readings from Last 3 Encounters:  07/15/14 122/76  05/27/14 130/78  04/18/14 104/62    Review of Systems     Objective:   Physical Exam        Assessment & Plan:

## 2014-07-15 NOTE — Progress Notes (Signed)
Subjective:    Patient ID: Shane [name], male    DOB: [birth year], 49 y.o.   MRN: [id number]  HPI 49 year-old patient who is seen today for a preventive health examination.  Approximately one year ago he was placed on amlodipine for mild hypertension.  He has tolerated this medication well and has had excellent blood pressure readings.   He has been evaluated by orthopedics due to left groin and left medial thigh discomfort.  This was initially felt to be more musculoligamentous but discomfort persists, although it has improved.  Orthopedic evaluation included a left hip MRI that was normal.  He has a history of cervical disc disease and lumbar disc disease is now suspected as a cause of his discomfort.  He has some discomfort at work but pain is alleviated by rest.  He is considering a lumbar MRI if symptoms intensify  Past medical history is pertinent for a right knee arthroscopic surgery in February of 2013 The patient had a negative home sleep study in December of 2013   Wt Readings from Last 3 Encounters:  07/15/14 263 lb (119.296 kg)  05/27/14 261 lb (118.389 kg)  04/18/14 256 lb (116.121 kg)    Past Medical History  Diagnosis Date  . GERD (gastroesophageal reflux disease)   . Obesity   . Chronic knee pain   . Left shoulder pain     History   Social History  . Marital Status: Married    Spouse Name: N/A    Number of Children: N/A  . Years of Education: N/A   Occupational History  . Not on file.   Social History Main Topics  . Smoking status: Never Smoker   . Smokeless tobacco: Never Used  . Alcohol Use: No  . Drug Use: No  . Sexual Activity: Not on file   Other Topics Concern  . Not on file   Social History Narrative    Past Surgical History  Procedure Laterality Date  . Cervical disc surgery    . Carpal pummel repair      Family History  Problem Relation Age of Onset  . Heart disease Mother     mitrial valve repair, CABG   . Prostate cancer  Father   . Hypertension Father     Allergies  Allergen Reactions  . Penicillins Cross Reactors Rash    Current Outpatient Prescriptions on File Prior to Visit  Medication Sig Dispense Refill  . amLODipine (NORVASC) 2.5 MG tablet Take 1 tablet (2.5 mg total) by mouth daily. 30 tablet 11   No current facility-administered medications on file prior to visit.    BP 122/76 mmHg  Pulse 74  Temp(Src) 98.3 F (36.8 C) (Oral)  Resp 20  Ht 5' 8.75" (1.746 m)  Wt 263 lb (119.296 kg)  BMI 39.13 kg/m2  SpO2 97%     Review of Systems  Constitutional: Negative for fever, chills, activity change, appetite change and fatigue.  HENT: Negative for congestion, dental problem, ear pain, hearing loss, mouth sores, rhinorrhea, sinus pressure, sneezing, tinnitus, trouble swallowing and voice change.   Eyes: Negative for photophobia, pain, redness and visual disturbance.  Respiratory: Negative for apnea, cough, choking, chest tightness, shortness of breath and wheezing.   Cardiovascular: Negative for chest pain, palpitations and leg swelling.  Gastrointestinal: Negative for nausea, vomiting, abdominal pain, diarrhea, constipation, blood in stool, abdominal distention, anal bleeding and rectal pain.  Genitourinary: Negative for dysuria, urgency, frequency, hematuria, flank pain, decreased urine volume, discharge, penile  swelling, scrotal swelling, difficulty urinating, genital sores and testicular pain.  Musculoskeletal: Negative for myalgias, back pain, joint swelling, arthralgias, gait problem, neck pain and neck stiffness.  Skin: Negative for color change, rash and wound.  Neurological: Negative for dizziness, tremors, seizures, syncope, facial asymmetry, speech difficulty, weakness, light-headedness, numbness and headaches.  Hematological: Negative for adenopathy. Does not bruise/bleed easily.  Psychiatric/Behavioral: Negative for suicidal ideas, hallucinations, behavioral problems, confusion,  sleep disturbance, self-injury, dysphoric mood, decreased concentration and agitation. The patient is not nervous/anxious.        Objective:   Physical Exam  Constitutional: He appears well-developed and well-nourished.  HENT:  Head: Normocephalic and atraumatic.  Right Ear: External ear normal.  Left Ear: External ear normal.  Nose: Nose normal.  Mouth/Throat: Oropharynx is clear and moist.  Eyes: Conjunctivae and EOM are normal. Pupils are equal, round, and reactive to light. No scleral icterus.  Neck: Normal range of motion. Neck supple. No JVD present. No thyromegaly present.  Cardiovascular: Regular rhythm, normal heart sounds and intact distal pulses.  Exam reveals no gallop and no friction rub.   No murmur heard. Pulmonary/Chest: Effort normal and breath sounds normal. He exhibits no tenderness.  Abdominal: Soft. Bowel sounds are normal. He exhibits no distension and no mass. There is no tenderness.  Genitourinary: Prostate normal and penis normal.  Musculoskeletal: Normal range of motion. He exhibits no edema or tenderness.  Lymphadenopathy:    He has no cervical adenopathy.  Neurological: He is alert. He has normal reflexes. No cranial nerve deficit. Coordination normal.  Skin: Skin is warm and dry. No rash noted.  Psychiatric: He has a normal mood and affect. His behavior is normal.          Assessment & Plan:  Preventive health examination Hypertension, well-controlled Left groin/medial thigh pain.  Still may be musculoligamentous but due to the chronicity lumbar disc pathology also a possibility.  Symptoms are improving.  Plan is to observe at this point Exogenous obesity. Weight loss encouraged Regular exercise regimen encouraged  Recheck 1 or 2 years

## 2014-07-15 NOTE — Patient Instructions (Addendum)
Limit your sodium (Salt) intake    It is important that you exercise regularly, at least 20 minutes 3 to 4 times per week.  If you develop chest pain or shortness of breath seek  medical attention.  You need to lose weight.  Consider a lower calorie diet and regular exercise.  Please check your blood pressure on a regular basis.  If it is consistently greater than 150/90, please make an office appointment.  Health Maintenance A healthy lifestyle and preventative care can promote health and wellness.  Maintain regular health, dental, and eye exams.  Eat a healthy diet. Foods like vegetables, fruits, whole grains, low-fat dairy products, and lean protein foods contain the nutrients you need and are low in calories. Decrease your intake of foods high in solid fats, added sugars, and salt. Get information about a proper diet from your health care provider, if necessary.  Regular physical exercise is one of the most important things you can do for your health. Most adults should get at least 150 minutes of moderate-intensity exercise (any activity that increases your heart rate and causes you to sweat) each week. In addition, most adults need muscle-strengthening exercises on 2 or more days a week.   Maintain a healthy weight. The body mass index (BMI) is a screening tool to identify possible weight problems. It provides an estimate of body fat based on height and weight. Your health care provider can find your BMI and can help you achieve or maintain a healthy weight. For males 20 years and older:  A BMI below 18.5 is considered underweight.  A BMI of 18.5 to 24.9 is normal.  A BMI of 25 to 29.9 is considered overweight.  A BMI of 30 and above is considered obese.  Maintain normal blood lipids and cholesterol by exercising and minimizing your intake of saturated fat. Eat a balanced diet with plenty of fruits and vegetables. Blood tests for lipids and cholesterol should begin at age 45 and be  repeated every 5 years. If your lipid or cholesterol levels are high, you are over age 84, or you are at high risk for heart disease, you may need your cholesterol levels checked more frequently.Ongoing high lipid and cholesterol levels should be treated with medicines if diet and exercise are not working.  If you smoke, find out from your health care provider how to quit. If you do not use tobacco, do not start.  Lung cancer screening is recommended for adults aged 64-80 years who are at high risk for developing lung cancer because of a history of smoking. A yearly low-dose CT scan of the lungs is recommended for people who have at least a 30-pack-year history of smoking and are current smokers or have quit within the past 15 years. A pack year of smoking is smoking an average of 1 pack of cigarettes a day for 1 year (for example, a 30-pack-year history of smoking could mean smoking 1 pack a day for 30 years or 2 packs a day for 15 years). Yearly screening should continue until the smoker has stopped smoking for at least 15 years. Yearly screening should be stopped for people who develop a health problem that would prevent them from having lung cancer treatment.  If you choose to drink alcohol, do not have more than 2 drinks per day. One drink is considered to be 12 oz (360 mL) of beer, 5 oz (150 mL) of wine, or 1.5 oz (45 mL) of liquor.  Avoid the  use of street drugs. Do not share needles with anyone. Ask for help if you need support or instructions about stopping the use of drugs.  High blood pressure causes heart disease and increases the risk of stroke. Blood pressure should be checked at least every 1-2 years. Ongoing high blood pressure should be treated with medicines if weight loss and exercise are not effective.  If you are 21-12 years old, ask your health care provider if you should take aspirin to prevent heart disease.  Diabetes screening involves taking a blood sample to check your  fasting blood sugar level. This should be done once every 3 years after age 101 if you are at a normal weight and without risk factors for diabetes. Testing should be considered at a younger age or be carried out more frequently if you are overweight and have at least 1 risk factor for diabetes.  Colorectal cancer can be detected and often prevented. Most routine colorectal cancer screening begins at the age of 74 and continues through age 31. However, your health care provider may recommend screening at an earlier age if you have risk factors for colon cancer. On a yearly basis, your health care provider may provide home test kits to check for hidden blood in the stool. A small camera at the end of a tube may be used to directly examine the colon (sigmoidoscopy or colonoscopy) to detect the earliest forms of colorectal cancer. Talk to your health care provider about this at age 29 when routine screening begins. A direct exam of the colon should be repeated every 5-10 years through age 38, unless early forms of precancerous polyps or small growths are found.  People who are at an increased risk for hepatitis B should be screened for this virus. You are considered at high risk for hepatitis B if:  You were born in a country where hepatitis B occurs often. Talk with your health care provider about which countries are considered high risk.  Your parents were born in a high-risk country and you have not received a shot to protect against hepatitis B (hepatitis B vaccine).  You have HIV or AIDS.  You use needles to inject street drugs.  You live with, or have sex with, someone who has hepatitis B.  You are a man who has sex with other men (MSM).  You get hemodialysis treatment.  You take certain medicines for conditions like cancer, organ transplantation, and autoimmune conditions.  Hepatitis C blood testing is recommended for all people born from [birth year] through 1964 and any individual with known risk  factors for hepatitis C.  Healthy men should no longer receive prostate-specific antigen (PSA) blood tests as part of routine cancer screening. Talk to your health care provider about prostate cancer screening.  Testicular cancer screening is not recommended for adolescents or adult males who have no symptoms. Screening includes self-exam, a health care provider exam, and other screening tests. Consult with your health care provider about any symptoms you have or any concerns you have about testicular cancer.  Practice safe sex. Use condoms and avoid high-risk sexual practices to reduce the spread of sexually transmitted infections (STIs).  You should be screened for STIs, including gonorrhea and chlamydia if:  You are sexually active and are younger than 24 years.  You are older than 24 years, and your health care provider tells you that you are at risk for this type of infection.  Your sexual activity has changed since you were last  screened, and you are at an increased risk for chlamydia or gonorrhea. Ask your health care provider if you are at risk.  If you are at risk of being infected with HIV, it is recommended that you take a prescription medicine daily to prevent HIV infection. This is called pre-exposure prophylaxis (PrEP). You are considered at risk if:  You are a man who has sex with other men (MSM).  You are a heterosexual man who is sexually active with multiple partners.  You take drugs by injection.  You are sexually active with a partner who has HIV.  Talk with your health care provider about whether you are at high risk of being infected with HIV. If you choose to begin PrEP, you should first be tested for HIV. You should then be tested every 3 months for as long as you are taking PrEP.  Use sunscreen. Apply sunscreen liberally and repeatedly throughout the day. You should seek shade when your shadow is shorter than you. Protect yourself by wearing long sleeves, pants, a  wide-brimmed hat, and sunglasses year round whenever you are outdoors.  Tell your health care provider of new moles or changes in moles, especially if there is a change in shape or color. Also, tell your health care provider if a mole is larger than the size of a pencil eraser.  A one-time screening for abdominal aortic aneurysm (AAA) and surgical repair of large AAAs by ultrasound is recommended for men aged 58-75 years who are current or former smokers.  Stay current with your vaccines (immunizations). Document Released: 12/24/2007 Document Revised: 07/02/2013 Document Reviewed: 11/22/2010 [facility] Patient Information 2015 [place], [place]. This information is not intended to replace advice given to you by your health care provider. Make sure you discuss any questions you have with your health care provider.

## 2014-12-24 ENCOUNTER — Ambulatory Visit (INDEPENDENT_AMBULATORY_CARE_PROVIDER_SITE_OTHER): Payer: BLUE CROSS/BLUE SHIELD | Admitting: Family Medicine

## 2014-12-24 ENCOUNTER — Telehealth: Payer: Self-pay | Admitting: Internal Medicine

## 2014-12-24 ENCOUNTER — Encounter: Payer: Self-pay | Admitting: Family Medicine

## 2014-12-24 VITALS — BP 133/71 | HR 63 | Temp 98.0°F | Ht 68.75 in | Wt 272.0 lb

## 2014-12-24 DIAGNOSIS — G44209 Tension-type headache, unspecified, not intractable: Secondary | ICD-10-CM | POA: Diagnosis not present

## 2014-12-24 DIAGNOSIS — M542 Cervicalgia: Secondary | ICD-10-CM | POA: Diagnosis not present

## 2014-12-24 MED ORDER — CYCLOBENZAPRINE HCL 10 MG PO TABS: 10.0000 mg | ORAL_TABLET | Freq: Three times a day (TID) | ORAL | Status: DC | PRN

## 2014-12-24 MED ORDER — METHYLPREDNISOLONE 4 MG PO TBPK: ORAL_TABLET | ORAL | Status: DC

## 2014-12-24 NOTE — Progress Notes (Signed)
Pre visit review using our clinic review tool, if applicable. No additional management support is needed unless otherwise documented below in the visit note. 

## 2014-12-24 NOTE — Progress Notes (Signed)
   Subjective:    Patient ID: Shane [name], male    DOB: 1965-11-13, 49 y.o.   MRN: [id number]  HPI Here for one week of stiffness and pain in the posterior neck and HAs over the back of the head. No vision changes and no neurologic deficits. He has used Motrin with no relief. He had cervical spine surgery in 2003 and todayh e feels similar to the way he felt prior to that surgery.   Review of Systems  Constitutional: Negative.   Eyes: Negative.   Respiratory: Negative.   Musculoskeletal: Positive for neck pain and neck stiffness.  Neurological: Positive for headaches. Negative for dizziness, tremors, seizures, syncope, facial asymmetry, speech difficulty, weakness, light-headedness and numbness.       Objective:   Physical Exam  Constitutional: He is oriented to person, place, and time. He appears well-developed and well-nourished. No distress.  HENT:  Head: Normocephalic and atraumatic.  Right Ear: External ear normal.  Left Ear: External ear normal.  Nose: Nose normal.  Mouth/Throat: Oropharynx is clear and moist.  No photophobia   Eyes: Conjunctivae and EOM are normal. Pupils are equal, round, and reactive to light.  Neck: Neck supple. No thyromegaly present.  Cardiovascular: Normal rate, regular rhythm, normal heart sounds and intact distal pulses.   Pulmonary/Chest: Effort normal and breath sounds normal.  Musculoskeletal:  Tender in the posterior neck over the C6 and C7 area, ROM is full   Lymphadenopathy:    He has no cervical adenopathy.  Neurological: He is alert and oriented to person, place, and time. He has normal reflexes. No cranial nerve deficit. He exhibits normal muscle tone. Coordination normal.          Assessment & Plan:  Neck pain with tension HAs. Try heat, Flexeril and a Medrol dose pack. Recheck prn.

## 2014-12-24 NOTE — Telephone Encounter (Signed)
Patient Name: Shane [name] DOB: Apr 24, 1966 Initial Comment Caller States husband is having a shallow rapid breathing since last night Nurse Assessment Nurse: Ronnald Ramp, RN, Miranda Date/Time (Eastern Time): 12/24/2014 8:27:42 AM Confirm and document reason for call. If symptomatic, describe symptoms. ---Caller states her husband has had shallow, rapid breathing since last night. Last night he had a headache and ringing in his ears. He still has a headache today. He denies any SOB or difficulty breathing. No signs of illness or fever. Has the patient traveled out of the country within the last 30 days? ---Not Applicable Does the patient require triage? ---Yes Related visit to physician within the last 2 weeks? ---No Does the PT have any chronic conditions? (i.e. diabetes, asthma, etc.) ---Yes List chronic conditions. ---HTN Guidelines Guideline Title Affirmed Question Affirmed Notes Breathing Difficulty [1] MILD difficulty breathing (e.g., minimal/no SOB at rest, SOB with walking, pulse <100) AND [2] NEW-onset or WORSE than normal Final Disposition User See Physician within 4 Hours (or PCP triage) Ronnald Ramp, RN, Miranda Comments Appt schedule for 1045 with Dr. Sarajane Jews

## 2015-01-13 ENCOUNTER — Ambulatory Visit (INDEPENDENT_AMBULATORY_CARE_PROVIDER_SITE_OTHER)
Admission: RE | Admit: 2015-01-13 | Discharge: 2015-01-13 | Disposition: A | Discharge status: Home / Self Care | Payer: BLUE CROSS/BLUE SHIELD | Source: Ambulatory Visit | Attending: Family Medicine | Admitting: Family Medicine

## 2015-01-13 ENCOUNTER — Telehealth: Payer: Self-pay | Admitting: Internal Medicine

## 2015-01-13 DIAGNOSIS — M542 Cervicalgia: Secondary | ICD-10-CM

## 2015-01-13 NOTE — Telephone Encounter (Signed)
I ordered a cervical spine Xray at the [facility], he can go anytime

## 2015-01-13 NOTE — Telephone Encounter (Signed)
Pt saw dr fry on 12-24-14 for neck pain. Pt would like to proceed with xray or mri of neck. Pt is still having pain

## 2015-01-13 NOTE — Telephone Encounter (Signed)
I left a message with wife, she will contact pt.

## 2015-01-14 ENCOUNTER — Telehealth: Payer: Self-pay | Admitting: Internal Medicine

## 2015-01-14 NOTE — Telephone Encounter (Signed)
Pt saw dr fry last month and was sent today for xray.  Pt would like to know if results are back.

## 2015-01-14 NOTE — Telephone Encounter (Signed)
I spoke with pt and gave results.  

## 2015-02-01 IMAGING — CR DG CHEST 2V
2 series · 2 of 2 positions shown · non-contrast
Comparison: None available

CLINICAL DATA: Chest pain and tachycardia

EXAM:
CHEST  2 VIEW

[w chest pa]
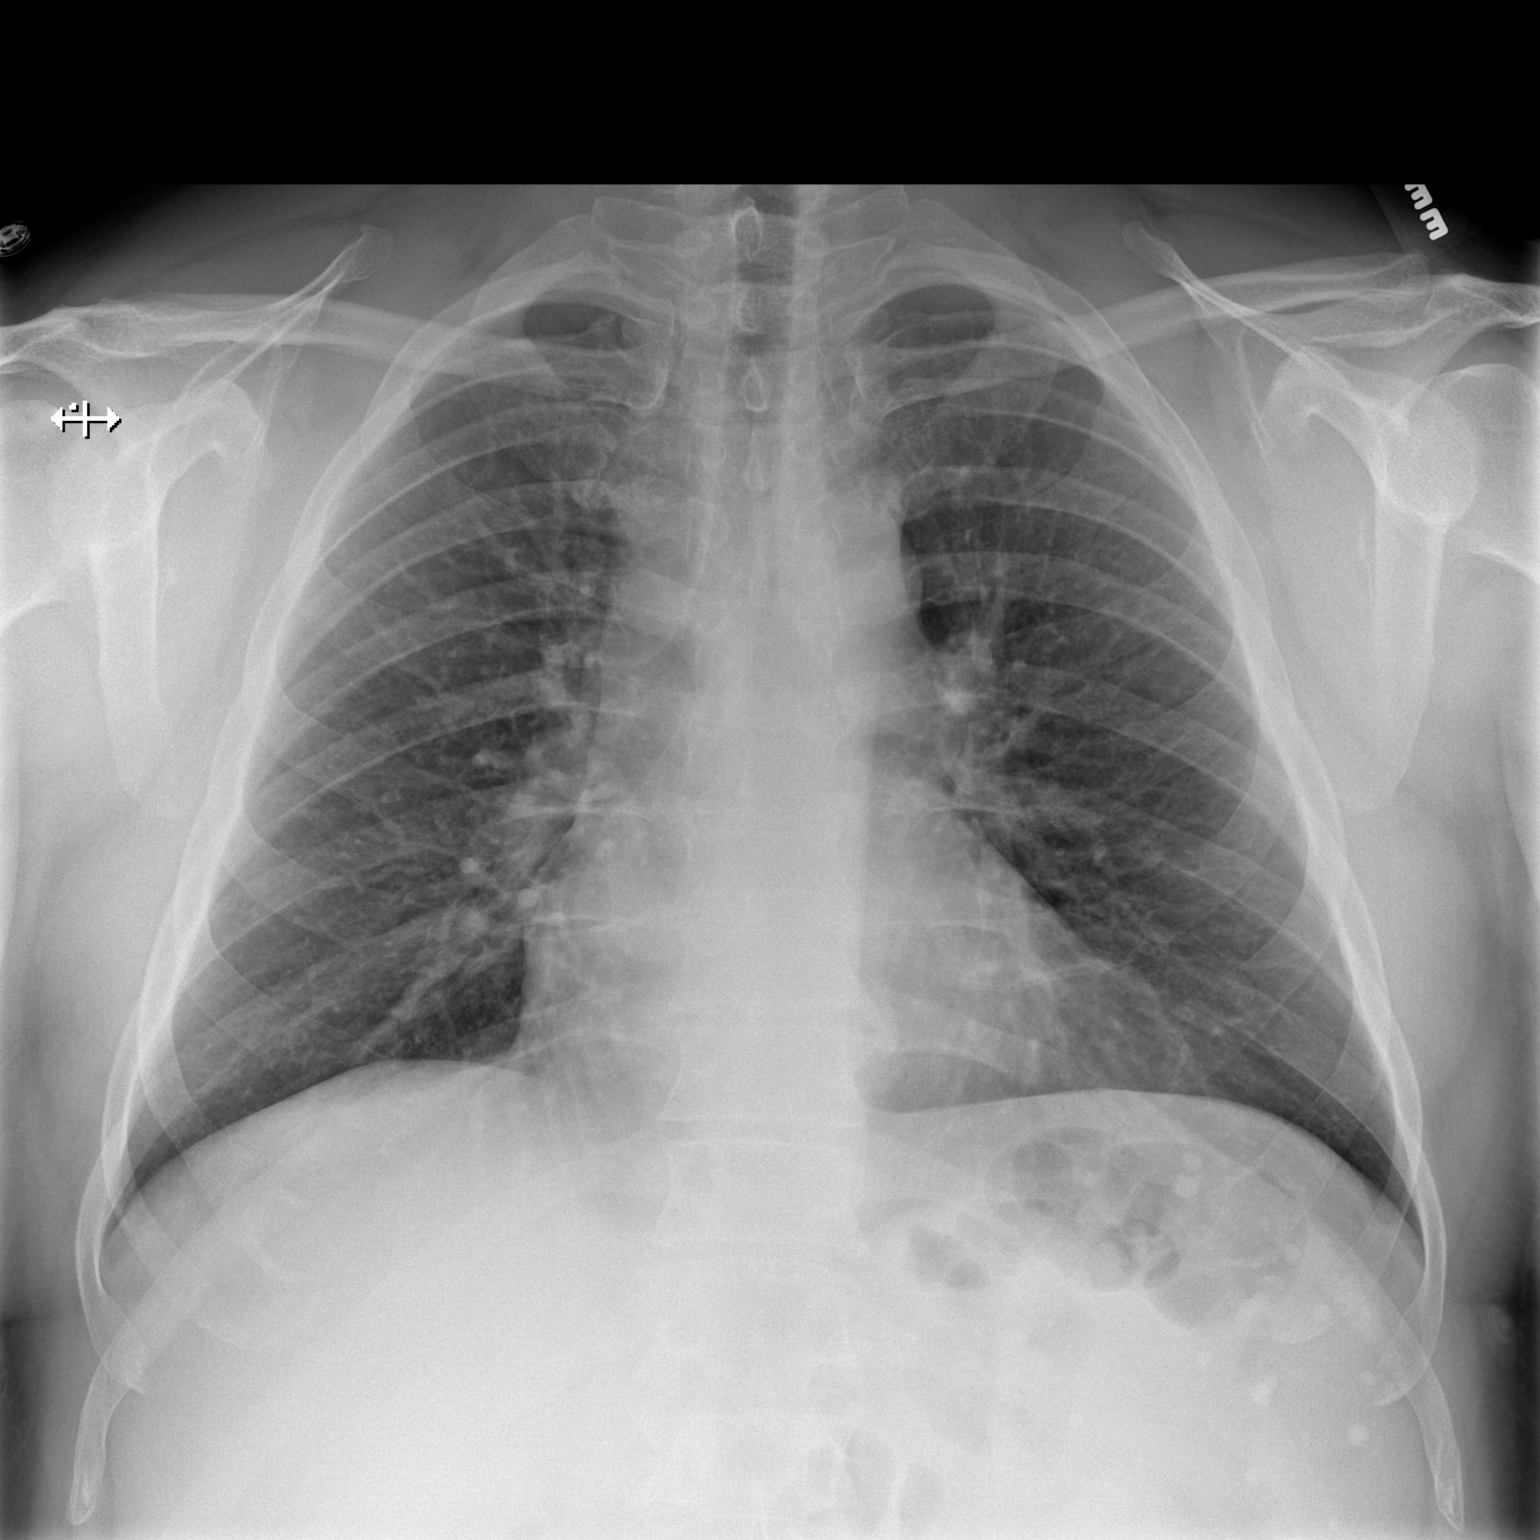

[w chest lat]
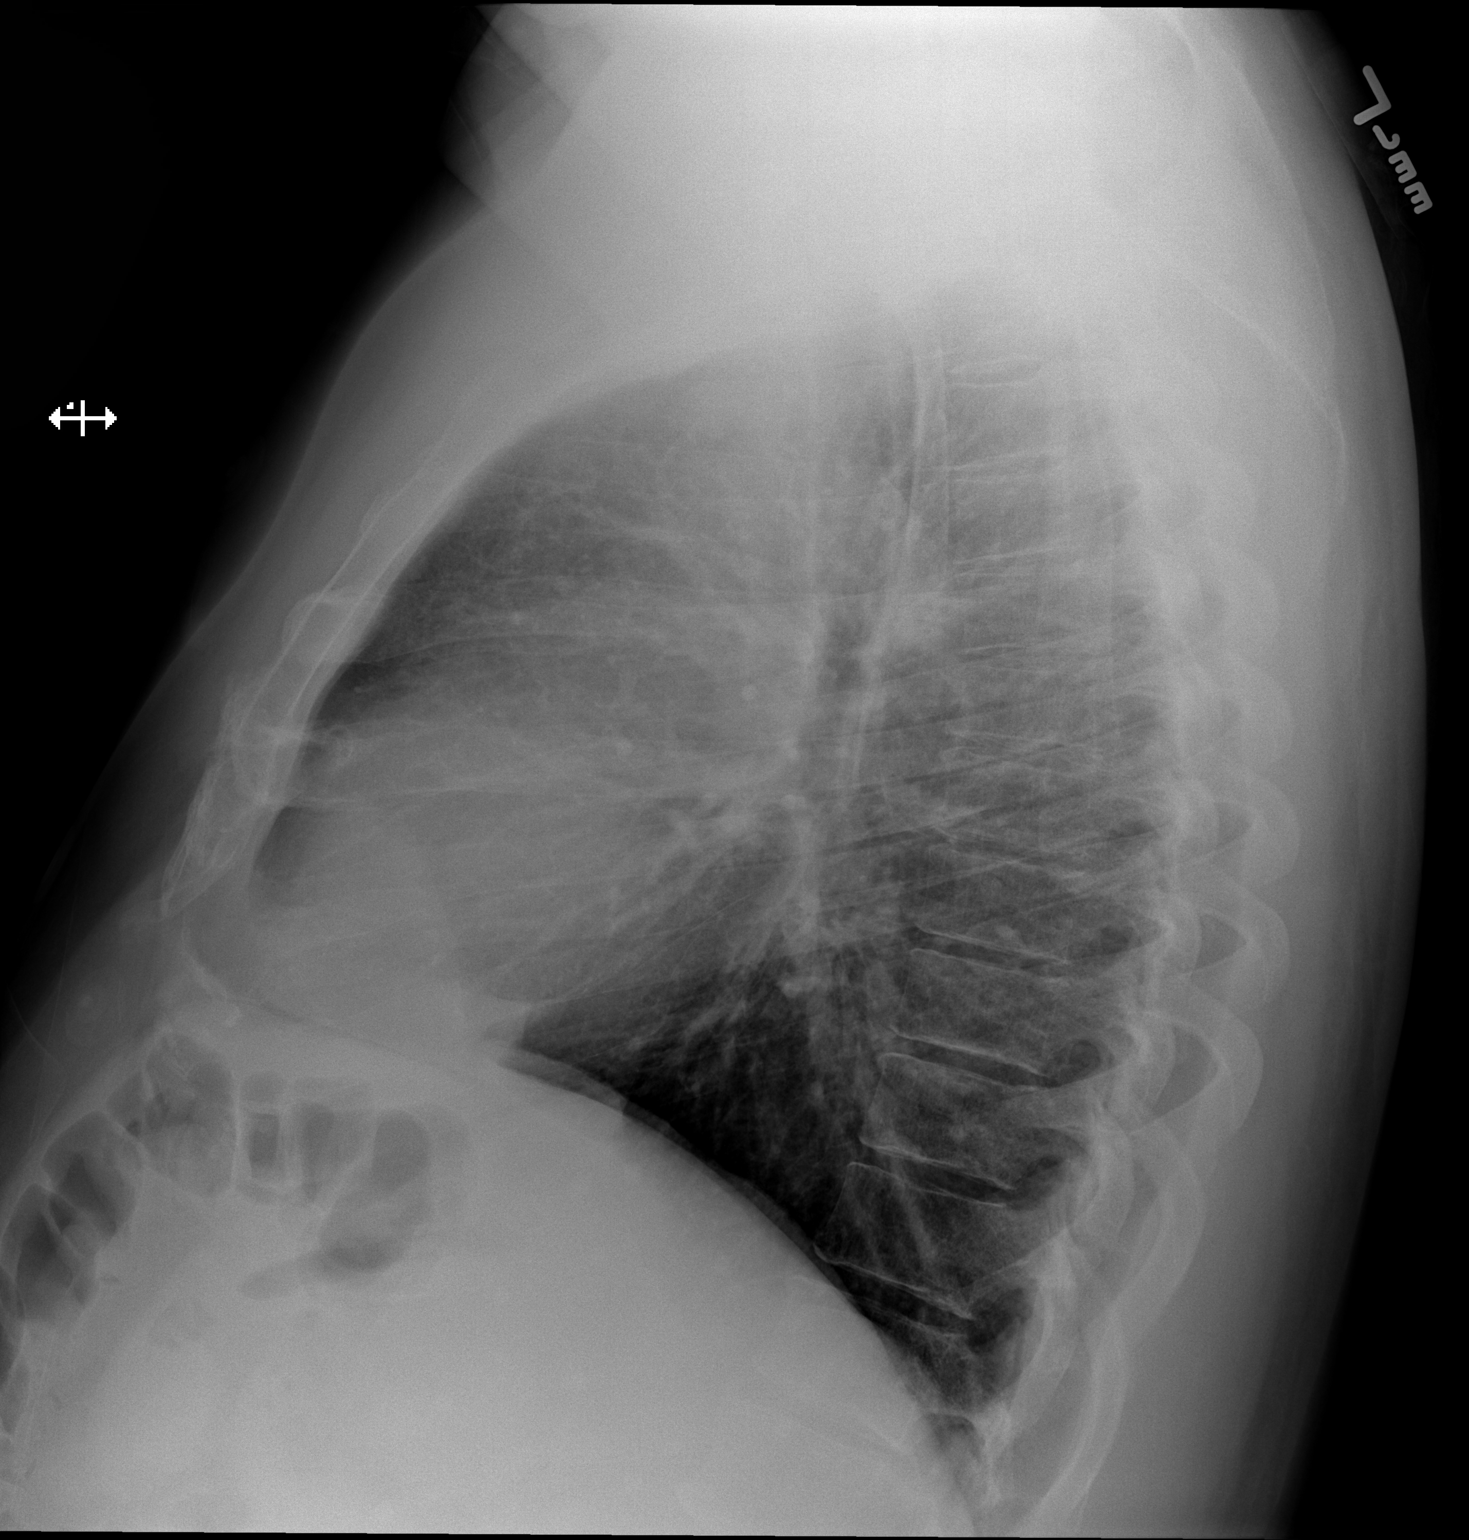

[2 of 2 positions shown; findings below may reference images not displayed]

FINDINGS: The heart size and mediastinal contours are within normal limits.
Both lungs are clear. The visualized skeletal structures are
unremarkable. Lower cervical fusion hardware noted.
IMPRESSION: No active cardiopulmonary disease.

## 2015-03-04 ENCOUNTER — Encounter: Payer: Self-pay | Admitting: Internal Medicine

## 2015-03-04 ENCOUNTER — Ambulatory Visit (INDEPENDENT_AMBULATORY_CARE_PROVIDER_SITE_OTHER): Payer: BLUE CROSS/BLUE SHIELD | Admitting: Internal Medicine

## 2015-03-04 VITALS — BP 150/80 | HR 92 | Temp 99.1°F | Resp 20 | Ht 68.75 in | Wt 278.0 lb

## 2015-03-04 DIAGNOSIS — I10 Essential (primary) hypertension: Secondary | ICD-10-CM | POA: Diagnosis not present

## 2015-03-04 DIAGNOSIS — R197 Diarrhea, unspecified: Secondary | ICD-10-CM | POA: Diagnosis not present

## 2015-03-04 DIAGNOSIS — K529 Noninfective gastroenteritis and colitis, unspecified: Secondary | ICD-10-CM

## 2015-03-04 NOTE — Patient Instructions (Signed)
Drink as much fluid as you  can tolerate over the next few days  Call or return to clinic prn if these symptoms worsen or fail to improve as anticipated.  

## 2015-03-04 NOTE — Progress Notes (Signed)
Pre visit review using our clinic review tool, if applicable. No additional management support is needed unless otherwise documented below in the visit note. 

## 2015-03-04 NOTE — Progress Notes (Signed)
Subjective:    Patient ID: Shane [name], male    DOB: Jun 14, 1966, 49 y.o.   MRN: [id number]  HPI  49 year old patient who has a history of essential hypertension.  He was well until 3 days ago when he developed an acute diarrheal illness.  This has been associated with nausea but no vomiting.  He has been unable to work due to the frequent diarrhea and weakness.  There are no bathroom facilities while he is at work.  Today he is improved but still has low-grade fever and some weakness and nausea.  Past Medical History  Diagnosis Date  . GERD (gastroesophageal reflux disease)   . Obesity   . Chronic knee pain   . Left shoulder pain     Social History   Social History  . Marital Status: Married    Spouse Name: N/A  . Number of Children: N/A  . Years of Education: N/A   Occupational History  . Not on file.   Social History Main Topics  . Smoking status: Never Smoker   . Smokeless tobacco: Never Used  . Alcohol Use: No  . Drug Use: No  . Sexual Activity: Not on file   Other Topics Concern  . Not on file   Social History Narrative    Past Surgical History  Procedure Laterality Date  . Cervical disc surgery  2003    per Dr. Sherwood Gambler   . Carpal pummel repair      Family History  Problem Relation Age of Onset  . Heart disease Mother     mitrial valve repair, CABG   . Prostate cancer Father   . Hypertension Father     Allergies  Allergen Reactions  . Penicillins Cross Reactors Rash    Current Outpatient Prescriptions on File Prior to Visit  Medication Sig Dispense Refill  . amLODipine (NORVASC) 2.5 MG tablet Take 1 tablet (2.5 mg total) by mouth daily. 90 tablet 11  . cyclobenzaprine (FLEXERIL) 10 MG tablet Take 1 tablet (10 mg total) by mouth 3 (three) times daily as needed for muscle spasms. (Patient not taking: Reported on 03/04/2015) 60 tablet 0   No current facility-administered medications on file prior to visit.    BP 150/80 mmHg  Pulse 92   Temp(Src) 99.1 F (37.3 C) (Oral)  Resp 20  Ht 5' 8.75" (1.746 m)  Wt 278 lb (126.1 kg)  BMI 41.36 kg/m2  SpO2 98%       Review of Systems  Constitutional: Positive for activity change, appetite change and fatigue. Negative for fever and chills.  HENT: Negative for congestion, dental problem, ear pain, hearing loss, sore throat, tinnitus, trouble swallowing and voice change.   Eyes: Negative for pain, discharge and visual disturbance.  Respiratory: Negative for cough, chest tightness, wheezing and stridor.   Cardiovascular: Negative for chest pain, palpitations and leg swelling.  Gastrointestinal: Positive for nausea and diarrhea. Negative for vomiting, abdominal pain, constipation, blood in stool and abdominal distention.  Genitourinary: Negative for urgency, hematuria, flank pain, discharge, difficulty urinating and genital sores.  Musculoskeletal: Negative for myalgias, back pain, joint swelling, arthralgias, gait problem and neck stiffness.  Skin: Negative for rash.  Neurological: Positive for weakness. Negative for dizziness, syncope, speech difficulty, numbness and headaches.  Hematological: Negative for adenopathy. Does not bruise/bleed easily.  Psychiatric/Behavioral: Negative for behavioral problems and dysphoric mood. The patient is not nervous/anxious.        Objective:   Physical Exam  Constitutional: He is oriented  to person, place, and time. He appears well-developed.  HENT:  Head: Normocephalic.  Right Ear: External ear normal.  Left Ear: External ear normal.  Mouth/Throat: Oropharynx is clear and moist.  Appears well-hydrated  Eyes: Conjunctivae and EOM are normal.  Neck: Normal range of motion.  Cardiovascular: Normal rate and normal heart sounds.   Pulmonary/Chest: Breath sounds normal.  Abdominal: Soft. Bowel sounds are normal. He exhibits no distension. There is no tenderness. There is no rebound and no guarding.  Musculoskeletal: Normal range of motion.  He exhibits no edema or tenderness.  Neurological: He is alert and oriented to person, place, and time.  Psychiatric: He has a normal mood and affect. His behavior is normal.          Assessment & Plan:   Resolving viral gastroenteritis.  Will continue to liberalize fluid intake.  Note to be out of work for a couple more days, dispensed.  Will slowly advance diet Hypertension, well-controlled

## 2015-03-06 ENCOUNTER — Telehealth: Payer: Self-pay | Admitting: Internal Medicine

## 2015-03-06 DIAGNOSIS — M542 Cervicalgia: Secondary | ICD-10-CM

## 2015-03-06 DIAGNOSIS — R519 Headache, unspecified: Secondary | ICD-10-CM

## 2015-03-06 DIAGNOSIS — R51 Headache: Secondary | ICD-10-CM

## 2015-03-06 DIAGNOSIS — Z01812 Encounter for preprocedural laboratory examination: Secondary | ICD-10-CM

## 2015-03-06 NOTE — Telephone Encounter (Signed)
Wife called b/c pt saw Dr Sarajane Jews who ordered an xray. They called the specialist for an appt and the pt coordinator at Dr. Hosie Spangle, MD office advised pt he would need a MRI. She is calling to ask if Dr Sarajane Jews will order the MRI for pt.

## 2015-03-06 NOTE — Telephone Encounter (Signed)
Dr.K, pt needs MRI ordered before he can see specialist. Please advise

## 2015-03-09 NOTE — Telephone Encounter (Signed)
Please schedule cervical MRI

## 2015-03-10 ENCOUNTER — Other Ambulatory Visit (INDEPENDENT_AMBULATORY_CARE_PROVIDER_SITE_OTHER): Payer: BLUE CROSS/BLUE SHIELD

## 2015-03-10 DIAGNOSIS — Z01812 Encounter for preprocedural laboratory examination: Secondary | ICD-10-CM | POA: Diagnosis not present

## 2015-03-10 NOTE — Telephone Encounter (Signed)
Left message on voicemail to call office.  

## 2015-03-10 NOTE — Telephone Encounter (Signed)
Pt called back, told him need to have some lab work done prior to MRI, can you come in today? Pt said he will try to come by later today to have labs drawn. Told him okay.

## 2015-03-11 LAB — CREATININE, SERUM: Creatinine, Ser: 1.01 mg/dL (ref 0.40–1.50)

## 2015-03-11 LAB — BUN: BUN: 13 mg/dL (ref 6–23)

## 2015-03-13 ENCOUNTER — Ambulatory Visit
Admission: RE | Admit: 2015-03-13 | Discharge: 2015-03-13 | Disposition: A | Discharge status: Home / Self Care | Payer: BLUE CROSS/BLUE SHIELD | Source: Ambulatory Visit | Attending: Internal Medicine | Admitting: Internal Medicine

## 2015-03-13 ENCOUNTER — Other Ambulatory Visit: Payer: Self-pay | Admitting: Internal Medicine

## 2015-03-13 DIAGNOSIS — R519 Headache, unspecified: Secondary | ICD-10-CM

## 2015-03-13 DIAGNOSIS — M542 Cervicalgia: Secondary | ICD-10-CM

## 2015-03-13 DIAGNOSIS — R51 Headache: Secondary | ICD-10-CM

## 2015-03-22 ENCOUNTER — Ambulatory Visit
Admission: RE | Admit: 2015-03-22 | Discharge: 2015-03-22 | Disposition: A | Discharge status: Home / Self Care | Payer: BLUE CROSS/BLUE SHIELD | Source: Ambulatory Visit | Attending: Internal Medicine | Admitting: Internal Medicine

## 2015-03-22 DIAGNOSIS — R51 Headache: Secondary | ICD-10-CM

## 2015-03-22 DIAGNOSIS — R519 Headache, unspecified: Secondary | ICD-10-CM

## 2015-03-22 DIAGNOSIS — M542 Cervicalgia: Secondary | ICD-10-CM

## 2015-03-25 ENCOUNTER — Other Ambulatory Visit: Payer: No Typology Code available for payment source

## 2015-04-10 ENCOUNTER — Other Ambulatory Visit: Payer: Self-pay | Admitting: Neurosurgery

## 2015-04-10 DIAGNOSIS — M4722 Other spondylosis with radiculopathy, cervical region: Secondary | ICD-10-CM

## 2015-04-16 ENCOUNTER — Other Ambulatory Visit: Payer: Self-pay | Admitting: Neurosurgery

## 2015-04-16 DIAGNOSIS — M4722 Other spondylosis with radiculopathy, cervical region: Secondary | ICD-10-CM

## 2015-04-21 ENCOUNTER — Ambulatory Visit
Admission: RE | Admit: 2015-04-21 | Discharge: 2015-04-21 | Disposition: A | Discharge status: Home / Self Care | Payer: BLUE CROSS/BLUE SHIELD | Source: Ambulatory Visit | Attending: Neurosurgery | Admitting: Neurosurgery

## 2015-04-21 DIAGNOSIS — M4722 Other spondylosis with radiculopathy, cervical region: Secondary | ICD-10-CM

## 2015-04-21 MED ORDER — IOHEXOL 300 MG/ML  SOLN
1.0000 mL | Freq: Once | INTRAMUSCULAR | Status: DC | PRN
  Administered 2015-04-21: 1 mL via EPIDURAL

## 2015-04-21 MED ORDER — TRIAMCINOLONE ACETONIDE 40 MG/ML IJ SUSP (RADIOLOGY)
60.0000 mg | Freq: Once | INTRAMUSCULAR | Status: AC
  Administered 2015-04-21: 60 mg via EPIDURAL

## 2015-04-21 NOTE — Discharge Instructions (Addendum)

## 2015-06-18 ENCOUNTER — Ambulatory Visit (INDEPENDENT_AMBULATORY_CARE_PROVIDER_SITE_OTHER): Payer: BLUE CROSS/BLUE SHIELD | Admitting: Family Medicine

## 2015-06-18 ENCOUNTER — Encounter: Payer: Self-pay | Admitting: Family Medicine

## 2015-06-18 VITALS — BP 140/90 | HR 74 | Temp 99.2°F | Ht 68.75 in | Wt 277.0 lb

## 2015-06-18 DIAGNOSIS — R42 Dizziness and giddiness: Secondary | ICD-10-CM | POA: Diagnosis not present

## 2015-06-18 LAB — BASIC METABOLIC PANEL
BUN: 13 mg/dL (ref 6–23)
CALCIUM: 9.4 mg/dL (ref 8.4–10.5)
CO2: 28 meq/L (ref 19–32)
CREATININE: 0.77 mg/dL (ref 0.40–1.50)
Chloride: 104 mEq/L (ref 96–112)
GFR: 114.23 mL/min (ref >60.00)
Glucose, Bld: 86 mg/dL (ref 70–99)
Potassium: 4.3 mEq/L (ref 3.5–5.1)
SODIUM: 140 meq/L (ref 135–145)

## 2015-06-18 LAB — HEPATIC FUNCTION PANEL
ALT: 23 U/L (ref 0–53)
AST: 20 U/L (ref 0–37)
Albumin: 4.2 g/dL (ref 3.5–5.2)
Alkaline Phosphatase: 52 U/L (ref 39–117)
BILIRUBIN DIRECT: 0.2 mg/dL (ref 0.0–0.3)
BILIRUBIN TOTAL: 1 mg/dL (ref 0.2–1.2)
TOTAL PROTEIN: 6.9 g/dL (ref 6.0–8.3)

## 2015-06-18 LAB — CBC WITH DIFFERENTIAL/PLATELET
BASOS ABS: 0 10*3/uL (ref 0.0–0.1)
Basophils Relative: 0.6 % (ref 0.0–3.0)
EOS ABS: 0.1 10*3/uL (ref 0.0–0.7)
EOS PCT: 0.8 % (ref 0.0–5.0)
HCT: 43.8 % (ref 39.0–52.0)
HEMOGLOBIN: 14.6 g/dL (ref 13.0–17.0)
LYMPHS ABS: 2.2 10*3/uL (ref 0.7–4.0)
Lymphocytes Relative: 31.2 % (ref 12.0–46.0)
MCHC: 33.4 g/dL (ref 30.0–36.0)
MCV: 84.8 fl (ref 78.0–100.0)
MONO ABS: 0.6 10*3/uL (ref 0.1–1.0)
Monocytes Relative: 9.1 % (ref 3.0–12.0)
NEUTROS PCT: 58.3 % (ref 43.0–77.0)
Neutro Abs: 4.1 10*3/uL (ref 1.4–7.7)
Platelets: 194 10*3/uL (ref 150.0–400.0)
RBC: 5.17 Mil/uL (ref 4.22–5.81)
RDW: 14.6 % (ref 11.5–15.5)
WBC: 7.1 10*3/uL (ref 4.0–10.5)

## 2015-06-18 LAB — POCT URINALYSIS DIPSTICK
Bilirubin, UA: NEGATIVE
GLUCOSE UA: NEGATIVE
Ketones, UA: NEGATIVE
Leukocytes, UA: NEGATIVE
NITRITE UA: NEGATIVE
PROTEIN UA: NEGATIVE
Spec Grav, UA: >=1.03
UROBILINOGEN UA: 0.2
pH, UA: 5.5

## 2015-06-18 LAB — TSH: TSH: 1.51 u[IU]/mL (ref 0.35–4.50)

## 2015-06-18 NOTE — Progress Notes (Signed)
Pre visit review using our clinic review tool, if applicable. No additional management support is needed unless otherwise documented below in the visit note. 

## 2015-06-18 NOTE — Progress Notes (Signed)
   Subjective:    Patient ID: Shane [name], male    DOB: August 11, 1965, 49 y.o.   MRN: [id number]  HPI Here for one week of subtle and ill-defined symptoms including lightheadedness, loss of equilibrium, and occasional nausea. No chest pain or SOB. No cough or ST or headache. No vertigo symptoms. He takes 2.5 mg daily of amlodipine for a hx of elevated BP and rapid heartbeats. These symptoms come and go and they are nor related to eating or to exertion.    Review of Systems  Constitutional: Negative.   HENT: Negative.   Eyes: Negative.   Respiratory: Negative.   Cardiovascular: Negative.   Endocrine: Negative.   Genitourinary: Negative.   Neurological: Positive for light-headedness. Negative for dizziness, tremors, seizures, syncope, facial asymmetry, speech difficulty, weakness, numbness and headaches.       Objective:   Physical Exam  Constitutional: He is oriented to person, place, and time. He appears well-developed and well-nourished.  HENT:  Right Ear: External ear normal.  Left Ear: External ear normal.  Mouth/Throat: Oropharynx is clear and moist.  Eyes: Conjunctivae and EOM are normal. Pupils are equal, round, and reactive to light.  Neck: Neck supple. No thyromegaly present.  Cardiovascular: Normal rate, regular rhythm, normal heart sounds and intact distal pulses.   Pulmonary/Chest: Effort normal and breath sounds normal. No respiratory distress. He has no wheezes. He has no rales.  Abdominal: Soft. Bowel sounds are normal. He exhibits no distension and no mass. There is no tenderness. There is no rebound and no guarding.  Lymphadenopathy:    He has no cervical adenopathy.  Neurological: He is alert and oriented to person, place, and time. He has normal reflexes. No cranial nerve deficit. He exhibits normal muscle tone. Coordination normal.          Assessment & Plan:  Ill defined symptoms including lightheadness which may be related to BP. I asked him to  increase the Amlodipine to 2 tabs a day (total of 5 mg), and we will get some labs today. Written out of work today and tomorrow to drink fluids and to rest.

## 2015-07-10 ENCOUNTER — Other Ambulatory Visit (INDEPENDENT_AMBULATORY_CARE_PROVIDER_SITE_OTHER): Payer: BLUE CROSS/BLUE SHIELD

## 2015-07-10 DIAGNOSIS — Z0000 Encounter for general adult medical examination without abnormal findings: Secondary | ICD-10-CM | POA: Diagnosis not present

## 2015-07-10 LAB — CBC WITH DIFFERENTIAL/PLATELET
BASOS ABS: 0 10*3/uL (ref 0.0–0.1)
Basophils Relative: 0.5 % (ref 0.0–3.0)
Eosinophils Absolute: 0.1 10*3/uL (ref 0.0–0.7)
Eosinophils Relative: 1.1 % (ref 0.0–5.0)
HEMATOCRIT: 44.6 % (ref 39.0–52.0)
HEMOGLOBIN: 14.7 g/dL (ref 13.0–17.0)
LYMPHS PCT: 33.7 % (ref 12.0–46.0)
Lymphs Abs: 2 10*3/uL (ref 0.7–4.0)
MCHC: 32.9 g/dL (ref 30.0–36.0)
MCV: 85.8 fl (ref 78.0–100.0)
MONOS PCT: 10.6 % (ref 3.0–12.0)
Monocytes Absolute: 0.6 10*3/uL (ref 0.1–1.0)
Neutro Abs: 3.3 10*3/uL (ref 1.4–7.7)
Neutrophils Relative %: 54.1 % (ref 43.0–77.0)
Platelets: 173 10*3/uL (ref 150.0–400.0)
RBC: 5.2 Mil/uL (ref 4.22–5.81)
RDW: 14.1 % (ref 11.5–15.5)
WBC: 6.1 10*3/uL (ref 4.0–10.5)

## 2015-07-10 LAB — BASIC METABOLIC PANEL
BUN: 16 mg/dL (ref 6–23)
CALCIUM: 9.3 mg/dL (ref 8.4–10.5)
CO2: 29 meq/L (ref 19–32)
Chloride: 103 mEq/L (ref 96–112)
Creatinine, Ser: 0.89 mg/dL (ref 0.40–1.50)
GFR: 96.62 mL/min (ref >60.00)
Glucose, Bld: 92 mg/dL (ref 70–99)
Potassium: 4.3 mEq/L (ref 3.5–5.1)
SODIUM: 140 meq/L (ref 135–145)

## 2015-07-10 LAB — POCT URINALYSIS DIPSTICK
BILIRUBIN UA: NEGATIVE
GLUCOSE UA: NEGATIVE
Ketones, UA: NEGATIVE
Leukocytes, UA: NEGATIVE
Nitrite, UA: NEGATIVE
Protein, UA: NEGATIVE
SPEC GRAV UA: 1.025
Urobilinogen, UA: 1
pH, UA: 6

## 2015-07-10 LAB — LIPID PANEL
CHOL/HDL RATIO: 5
Cholesterol: 185 mg/dL (ref 0–200)
HDL: 34.2 mg/dL — ABNORMAL LOW (ref >39.00)
LDL CALC: 138 mg/dL — AB (ref 0–99)
NonHDL: 150.64
TRIGLYCERIDES: 61 mg/dL (ref 0.0–149.0)
VLDL: 12.2 mg/dL (ref 0.0–40.0)

## 2015-07-10 LAB — HEPATIC FUNCTION PANEL
ALBUMIN: 4.2 g/dL (ref 3.5–5.2)
ALK PHOS: 55 U/L (ref 39–117)
ALT: 19 U/L (ref 0–53)
AST: 20 U/L (ref 0–37)
Bilirubin, Direct: 0.3 mg/dL (ref 0.0–0.3)
TOTAL PROTEIN: 6.8 g/dL (ref 6.0–8.3)
Total Bilirubin: 1.5 mg/dL — ABNORMAL HIGH (ref 0.2–1.2)

## 2015-07-10 LAB — TSH: TSH: 1.61 u[IU]/mL (ref 0.35–4.50)

## 2015-07-17 ENCOUNTER — Encounter: Payer: Self-pay | Admitting: Internal Medicine

## 2015-07-17 ENCOUNTER — Ambulatory Visit (INDEPENDENT_AMBULATORY_CARE_PROVIDER_SITE_OTHER): Payer: BLUE CROSS/BLUE SHIELD | Admitting: Internal Medicine

## 2015-07-17 VITALS — BP 130/80 | HR 66 | Temp 98.7°F | Resp 20 | Ht 68.75 in | Wt 283.0 lb

## 2015-07-17 DIAGNOSIS — E669 Obesity, unspecified: Secondary | ICD-10-CM

## 2015-07-17 DIAGNOSIS — I10 Essential (primary) hypertension: Secondary | ICD-10-CM

## 2015-07-17 DIAGNOSIS — Z0000 Encounter for general adult medical examination without abnormal findings: Secondary | ICD-10-CM

## 2015-07-17 NOTE — Progress Notes (Signed)
Subjective:    Patient ID: Shane [name], male    DOB: 1966-06-21, 50 y.o.   MRN: [id number]  HPI  Wt Readings from Last 3 Encounters:  07/17/15 283 lb (128.368 kg)  06/18/15 277 lb (125.646 kg)  03/04/15 278 lb (126.1 kg)    BP Readings from Last 3 Encounters:  07/17/15 130/80  06/18/15 140/90  04/21/15 148/80    Subjective:    Patient ID: Shane [name], male    DOB: Oct 08, 1965, 50 y.o.   MRN: [id number]  HPI 50 year-old patient who is seen today for a preventive health examination.  Approximately two years ago he was placed on amlodipine for mild hypertension.  He has tolerated this medication well and has had excellent blood pressure readings.   He has been evaluated by orthopedics due to left groin and left medial thigh discomfort.  This was initially felt to be more musculoligamentous but discomfort persists, although it has improved.  Orthopedic evaluation included a left hip MRI that was normal.  He has a history of cervical disc disease and lumbar disc disease is now suspected as a cause of his discomfort.  He has some discomfort at work but pain is alleviated by rest.  He is considering a lumbar MRI if symptoms intensify  Past medical history is pertinent for a right knee arthroscopic surgery in February of 2013 The patient had a negative home sleep study in December of 2013   Wt Readings from Last 3 Encounters:  07/17/15 283 lb (128.368 kg)  06/18/15 277 lb (125.646 kg)  03/04/15 278 lb (126.1 kg)    Past Medical History  Diagnosis Date  . GERD (gastroesophageal reflux disease)   . Obesity   . Chronic knee pain   . Left shoulder pain     Social History   Social History  . Marital Status: Married    Spouse Name: N/A  . Number of Children: N/A  . Years of Education: N/A   Occupational History  . Not on file.   Social History Main Topics  . Smoking status: Never Smoker   . Smokeless tobacco: Never Used  . Alcohol Use: No  . Drug Use: No  .  Sexual Activity: Not on file   Other Topics Concern  . Not on file   Social History Narrative    Past Surgical History  Procedure Laterality Date  . Cervical disc surgery  2003    per Dr. Sherwood Gambler   . Carpal pummel repair      Family History  Problem Relation Age of Onset  . Heart disease Mother     mitrial valve repair, CABG   . Prostate cancer Father   . Hypertension Father     Allergies  Allergen Reactions  . Penicillins Cross Reactors Rash    Current Outpatient Prescriptions on File Prior to Visit  Medication Sig Dispense Refill  . amLODipine (NORVASC) 2.5 MG tablet Take 1 tablet (2.5 mg total) by mouth daily. (Patient taking differently: Take 2.5 mg by mouth 2 (two) times daily. ) 90 tablet 11  . cyclobenzaprine (FLEXERIL) 10 MG tablet Take 1 tablet (10 mg total) by mouth 3 (three) times daily as needed for muscle spasms. 60 tablet 0   No current facility-administered medications on file prior to visit.    BP 130/80 mmHg  Pulse 66  Temp(Src) 98.7 F (37.1 C) (Oral)  Resp 20  Ht 5' 8.75" (1.746 m)  Wt 283 lb (128.368 kg)  BMI 42.[place]  kg/m2  SpO2 97%     Review of Systems  Constitutional: Negative for fever, chills, activity change, appetite change and fatigue.  HENT: Negative for congestion, dental problem, ear pain, hearing loss, mouth sores, rhinorrhea, sinus pressure, sneezing, tinnitus, trouble swallowing and voice change.   Eyes: Negative for photophobia, pain, redness and visual disturbance.  Respiratory: Negative for apnea, cough, choking, chest tightness, shortness of breath and wheezing.   Cardiovascular: Negative for chest pain, palpitations and leg swelling.  Gastrointestinal: Negative for nausea, vomiting, abdominal pain, diarrhea, constipation, blood in stool, abdominal distention, anal bleeding and rectal pain.  Genitourinary: Negative for dysuria, urgency, frequency, hematuria, flank pain, decreased urine volume, discharge, penile swelling,  scrotal swelling, difficulty urinating, genital sores and testicular pain.  Musculoskeletal: Negative for myalgias, back pain, joint swelling, arthralgias, gait problem, neck pain and neck stiffness.  Skin: Negative for color change, rash and wound.  Neurological: Negative for dizziness, tremors, seizures, syncope, facial asymmetry, speech difficulty, weakness, light-headedness, numbness and headaches.  Hematological: Negative for adenopathy. Does not bruise/bleed easily.  Psychiatric/Behavioral: Negative for suicidal ideas, hallucinations, behavioral problems, confusion, sleep disturbance, self-injury, dysphoric mood, decreased concentration and agitation. The patient is not nervous/anxious.        Objective:   Physical Exam  Constitutional: He appears well-developed and well-nourished.  HENT:  Head: Normocephalic and atraumatic.  Right Ear: External ear normal.  Left Ear: External ear normal.  Nose: Nose normal.  Mouth/Throat: Oropharynx is clear and moist.  Eyes: Conjunctivae and EOM are normal. Pupils are equal, round, and reactive to light. No scleral icterus.  Neck: Normal range of motion. Neck supple. No JVD present. No thyromegaly present.  Cardiovascular: Regular rhythm, normal heart sounds and intact distal pulses.  Exam reveals no gallop and no friction rub.   No murmur heard. Pulmonary/Chest: Effort normal and breath sounds normal. He exhibits no tenderness.  Abdominal: Soft. Bowel sounds are normal. He exhibits no distension and no mass. There is no tenderness.  Genitourinary: Prostate normal and penis normal.  Musculoskeletal: Normal range of motion. He exhibits no edema or tenderness.  Lymphadenopathy:    He has no cervical adenopathy.  Neurological: He is alert. He has normal reflexes. No cranial nerve deficit. Coordination normal.  Skin: Skin is warm and dry. No rash noted.  Psychiatric: He has a normal mood and affect. His behavior is normal.          Assessment  & Plan:  Preventive health examination Hypertension, well-controlled Cervical disc disease.  Patient has neurosurgery follow-up later today  Exogenous obesity. Weight loss encouraged Regular exercise regimen encouraged  Recheck 1 or 2 years Review of Systems  As above    Objective:   Physical Exam  Genitourinary: Guaiac negative stool.          Assessment & Plan:   As above

## 2015-07-17 NOTE — Patient Instructions (Signed)
Limit your sodium (Salt) intake    It is important that you exercise regularly, at least 20 minutes 3 to 4 times per week.  If you develop chest pain or shortness of breath seek  medical attention.  You need to lose weight.  Consider a lower calorie diet and regular exercise.  Please check your blood pressure on a regular basis.  If it is consistently greater than 150/90, please make an office appointment.  Return in one year for follow-up\  Health Maintenance, Male A healthy lifestyle and preventative care can promote health and wellness.  Maintain regular health, dental, and eye exams.  Eat a healthy diet. Foods like vegetables, fruits, whole grains, low-fat dairy products, and lean protein foods contain the nutrients you need and are low in calories. Decrease your intake of foods high in solid fats, added sugars, and salt. Get information about a proper diet from your health care provider, if necessary.  Regular physical exercise is one of the most important things you can do for your health. Most adults should get at least 150 minutes of moderate-intensity exercise (any activity that increases your heart rate and causes you to sweat) each week. In addition, most adults need muscle-strengthening exercises on 2 or more days a week.   Maintain a healthy weight. The body mass index (BMI) is a screening tool to identify possible weight problems. It provides an estimate of body fat based on height and weight. Your health care provider can find your BMI and can help you achieve or maintain a healthy weight. For males 20 years and older:  A BMI below 18.5 is considered underweight.  A BMI of 18.5 to 24.9 is normal.  A BMI of 25 to 29.9 is considered overweight.  A BMI of 30 and above is considered obese.  Maintain normal blood lipids and cholesterol by exercising and minimizing your intake of saturated fat. Eat a balanced diet with plenty of fruits and vegetables. Blood tests for lipids and  cholesterol should begin at age 80 and be repeated every 5 years. If your lipid or cholesterol levels are high, you are over age 37, or you are at high risk for heart disease, you may need your cholesterol levels checked more frequently.Ongoing high lipid and cholesterol levels should be treated with medicines if diet and exercise are not working.  If you smoke, find out from your health care provider how to quit. If you do not use tobacco, do not start.  Lung cancer screening is recommended for adults aged 15-80 years who are at high risk for developing lung cancer because of a history of smoking. A yearly low-dose CT scan of the lungs is recommended for people who have at least a 30-pack-year history of smoking and are current smokers or have quit within the past 15 years. A pack year of smoking is smoking an average of 1 pack of cigarettes a day for 1 year (for example, a 30-pack-year history of smoking could mean smoking 1 pack a day for 30 years or 2 packs a day for 15 years). Yearly screening should continue until the smoker has stopped smoking for at least 15 years. Yearly screening should be stopped for people who develop a health problem that would prevent them from having lung cancer treatment.  If you choose to drink alcohol, do not have more than 2 drinks per day. One drink is considered to be 12 oz (360 mL) of beer, 5 oz (150 mL) of wine, or 1.5  oz (45 mL) of liquor.  Avoid the use of street drugs. Do not share needles with anyone. Ask for help if you need support or instructions about stopping the use of drugs.  High blood pressure causes heart disease and increases the risk of stroke. High blood pressure is more likely to develop in:  People who have blood pressure in the end of the normal range (100-139/85-89 mm Hg).  People who are overweight or obese.  People who are African American.  If you are 43-22 years of age, have your blood pressure checked every 3-5 years. If you are 31  years of age or older, have your blood pressure checked every year. You should have your blood pressure measured twice--once when you are at a hospital or clinic, and once when you are not at a hospital or clinic. Record the average of the two measurements. To check your blood pressure when you are not at a hospital or clinic, you can use:  An automated blood pressure machine at a pharmacy.  A home blood pressure monitor.  If you are 62-65 years old, ask your health care provider if you should take aspirin to prevent heart disease.  Diabetes screening involves taking a blood sample to check your fasting blood sugar level. This should be done once every 3 years after age 9 if you are at a normal weight and without risk factors for diabetes. Testing should be considered at a younger age or be carried out more frequently if you are overweight and have at least 1 risk factor for diabetes.  Colorectal cancer can be detected and often prevented. Most routine colorectal cancer screening begins at the age of 56 and continues through age 91. However, your health care provider may recommend screening at an earlier age if you have risk factors for colon cancer. On a yearly basis, your health care provider may provide home test kits to check for hidden blood in the stool. A small camera at the end of a tube may be used to directly examine the colon (sigmoidoscopy or colonoscopy) to detect the earliest forms of colorectal cancer. Talk to your health care provider about this at age 68 when routine screening begins. A direct exam of the colon should be repeated every 5-10 years through age 22, unless early forms of precancerous polyps or small growths are found.  People who are at an increased risk for hepatitis B should be screened for this virus. You are considered at high risk for hepatitis B if:  You were born in a country where hepatitis B occurs often. Talk with your health care provider about which countries  are considered high risk.  Your parents were born in a high-risk country and you have not received a shot to protect against hepatitis B (hepatitis B vaccine).  You have HIV or AIDS.  You use needles to inject street drugs.  You live with, or have sex with, someone who has hepatitis B.  You are a man who has sex with other men (MSM).  You get hemodialysis treatment.  You take certain medicines for conditions like cancer, organ transplantation, and autoimmune conditions.  Hepatitis C blood testing is recommended for all people born from [birth year] through 1964 and any individual with known risk factors for hepatitis C.  Healthy men should no longer receive prostate-specific antigen (PSA) blood tests as part of routine cancer screening. Talk to your health care provider about prostate cancer screening.  Testicular cancer screening is not recommended  for adolescents or adult males who have no symptoms. Screening includes self-exam, a health care provider exam, and other screening tests. Consult with your health care provider about any symptoms you have or any concerns you have about testicular cancer.  Practice safe sex. Use condoms and avoid high-risk sexual practices to reduce the spread of sexually transmitted infections (STIs).  You should be screened for STIs, including gonorrhea and chlamydia if:  You are sexually active and are younger than 24 years.  You are older than 24 years, and your health care provider tells you that you are at risk for this type of infection.  Your sexual activity has changed since you were last screened, and you are at an increased risk for chlamydia or gonorrhea. Ask your health care provider if you are at risk.  If you are at risk of being infected with HIV, it is recommended that you take a prescription medicine daily to prevent HIV infection. This is called pre-exposure prophylaxis (PrEP). You are considered at risk if:  You are a man who has sex with  other men (MSM).  You are a heterosexual man who is sexually active with multiple partners.  You take drugs by injection.  You are sexually active with a partner who has HIV.  Talk with your health care provider about whether you are at high risk of being infected with HIV. If you choose to begin PrEP, you should first be tested for HIV. You should then be tested every 3 months for as long as you are taking PrEP.  Use sunscreen. Apply sunscreen liberally and repeatedly throughout the day. You should seek shade when your shadow is shorter than you. Protect yourself by wearing long sleeves, pants, a wide-brimmed hat, and sunglasses year round whenever you are outdoors.  Tell your health care provider of new moles or changes in moles, especially if there is a change in shape or color. Also, tell your health care provider if a mole is larger than the size of a pencil eraser.  A one-time screening for abdominal aortic aneurysm (AAA) and surgical repair of large AAAs by ultrasound is recommended for men aged 31-75 years who are current or former smokers.  Stay current with your vaccines (immunizations).   This information is not intended to replace advice given to you by your health care provider. Make sure you discuss any questions you have with your health care provider.   Document Released: 12/24/2007 Document Revised: 07/18/2014 Document Reviewed: 11/22/2010 Elsevier Interactive Patient Education [employer].

## 2015-07-17 NOTE — Progress Notes (Signed)
Pre visit review using our clinic review tool, if applicable. No additional management support is needed unless otherwise documented below in the visit note. 

## 2015-07-20 ENCOUNTER — Other Ambulatory Visit: Payer: Self-pay | Admitting: Internal Medicine

## 2016-06-06 IMAGING — CR DG CERVICAL SPINE COMPLETE 4+V
6 series · 6 of 6 positions shown · non-contrast
Comparison: None.

CLINICAL DATA: Three weeks of bilateral neck and shoulder pain
without recent injury; anterior cervical fusion 13 years ago

EXAM:
CERVICAL SPINE  4+ VIEWS

[view not recorded (1 of 6)]
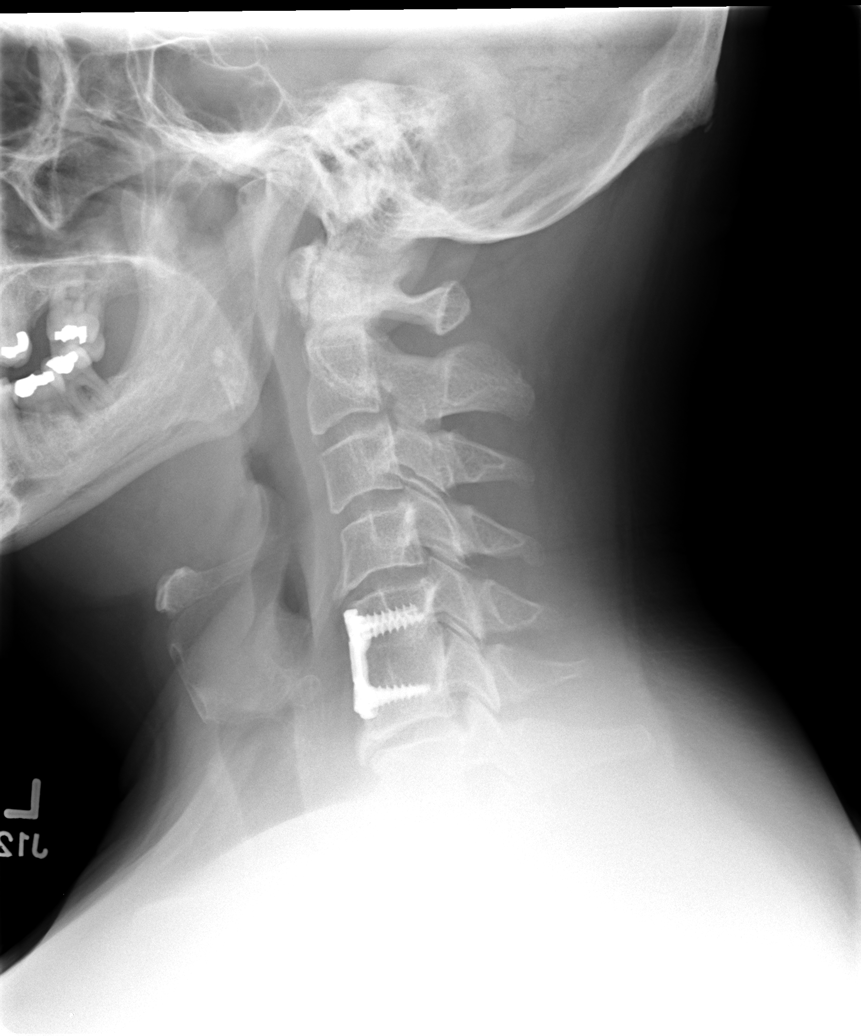

[view not recorded (2 of 6)]
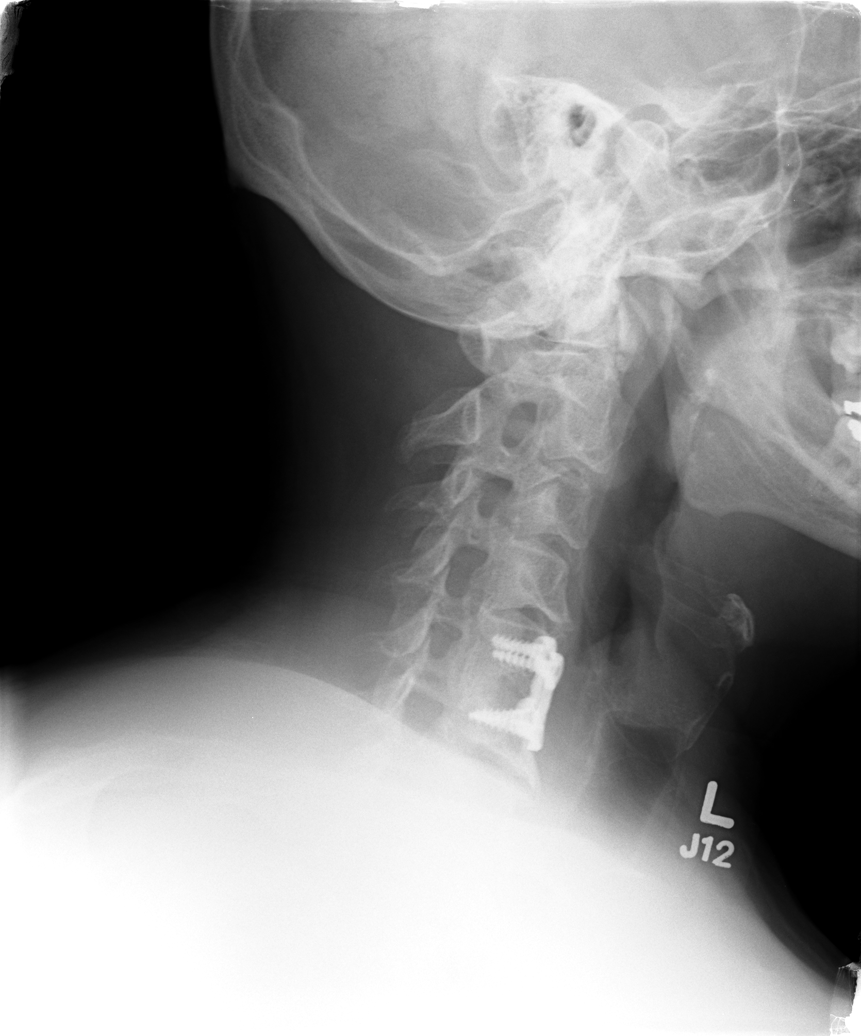

[view not recorded (3 of 6)]
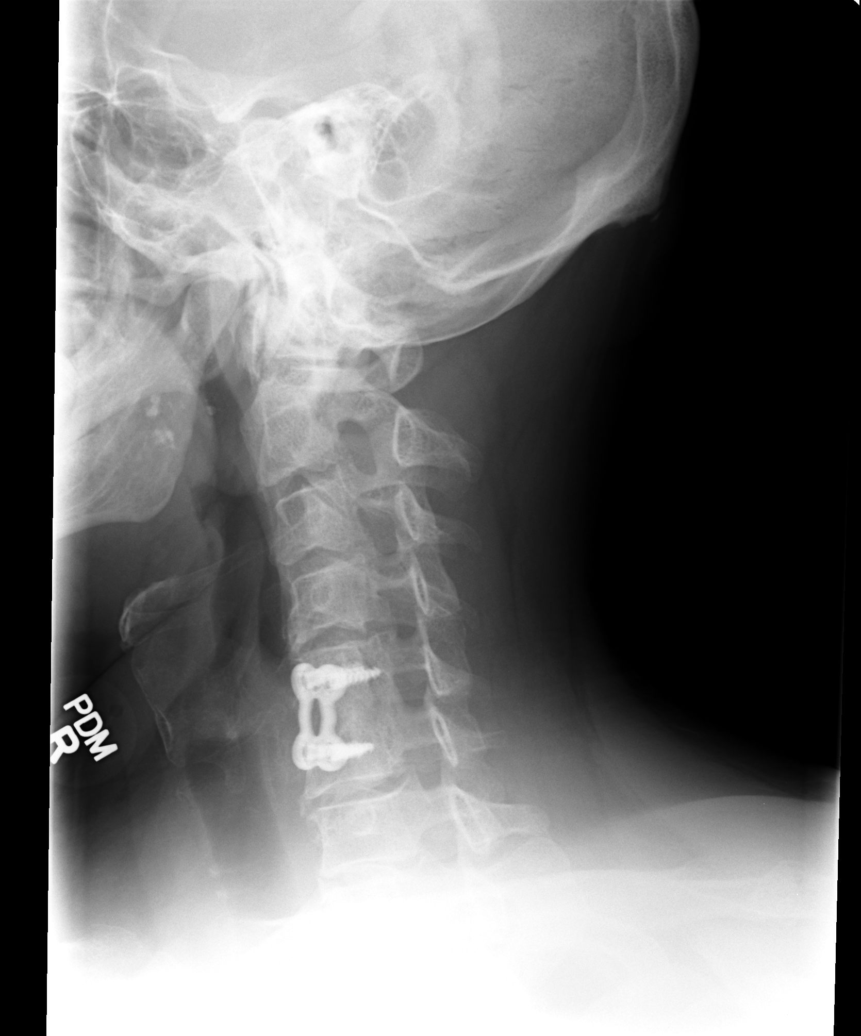

[view not recorded (4 of 6)]
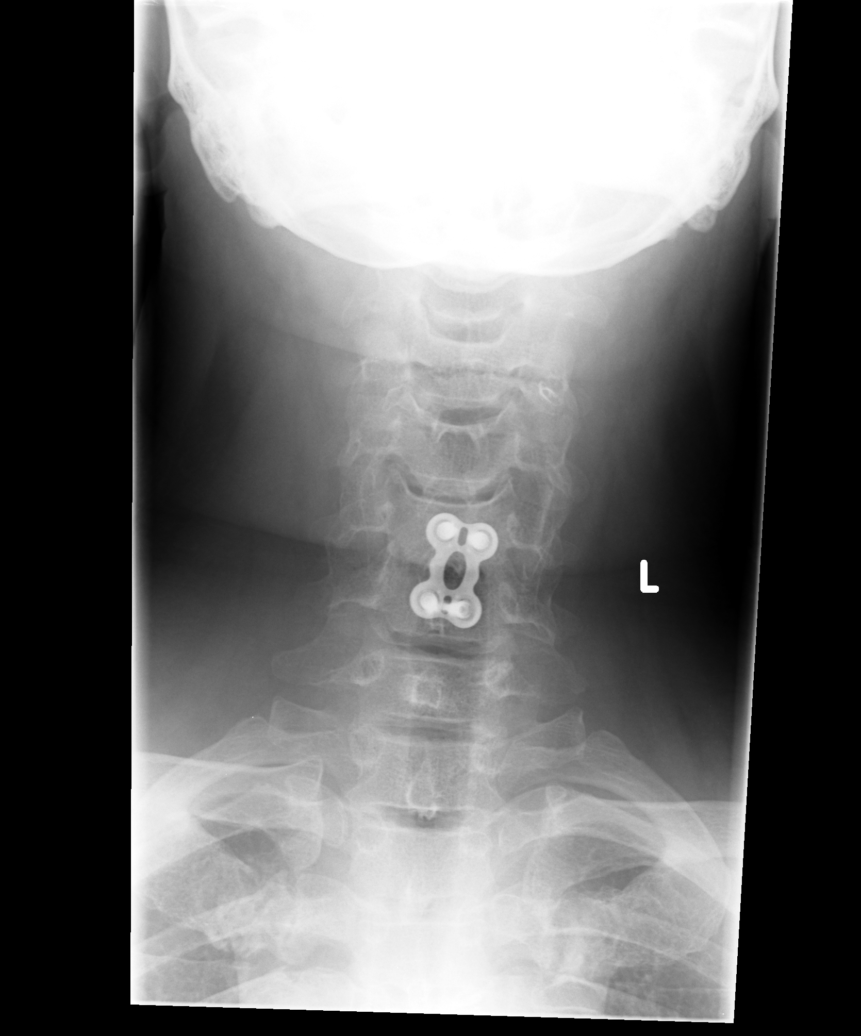

[view not recorded (5 of 6)]
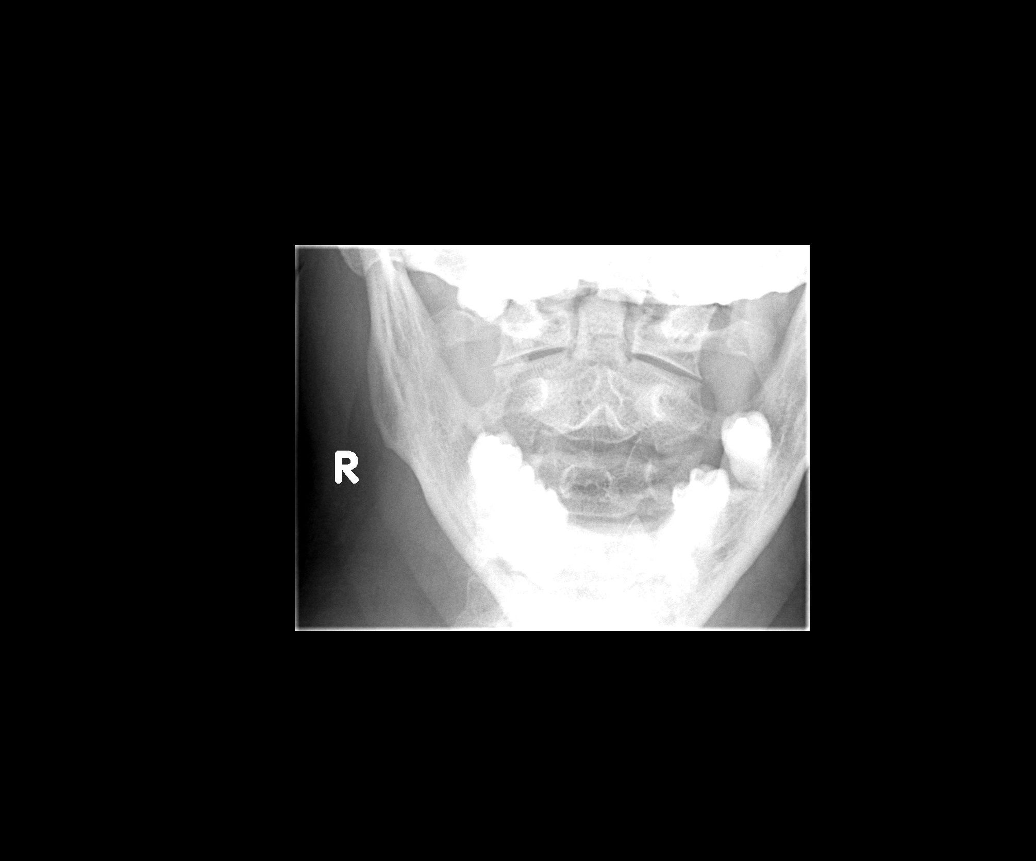

[view not recorded (6 of 6)]
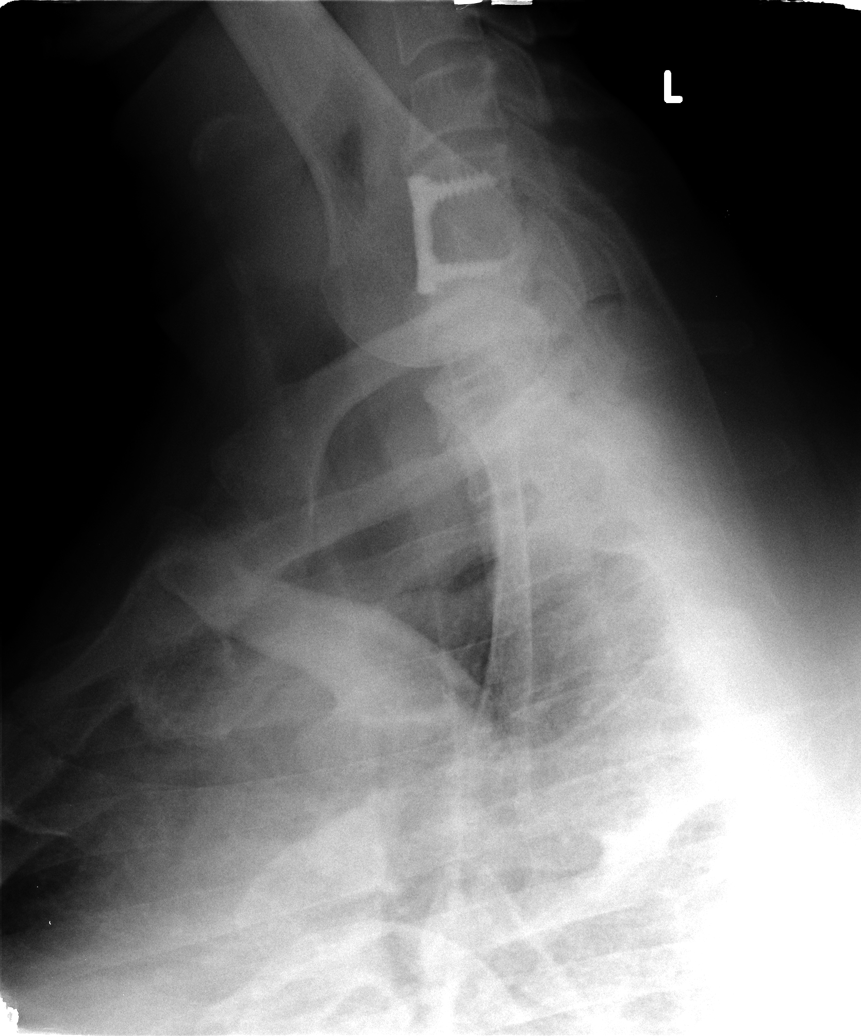

[6 of 6 positions shown; findings below may reference images not displayed]

FINDINGS: There is mild loss of the normal cervical lordosis. The cervical
vertebral bodies are preserved in height. The patient has undergone
previous anterior fusion at C5-6. The disc space is no longer
visible. There are mild degenerative changes at C4-5 and at C6-7.
There are small anterior endplate osteophytes at these levels. The
oblique views reveal no significant bony encroachment upon the
neural foramina. There is no perched facet nor significant facet
joint hypertrophy. The spinous processes and the odontoid are
intact. There is mild degenerative change of the atlanto dens
articulation.
IMPRESSION: 1. There are postsurgical changes at C5-6 with mild degenerative
disc changes at C4-5 and C6-7.
2. Loss of the normal cervical lordosis consistent with muscle
spasm.

## 2017-12-22 ENCOUNTER — Ambulatory Visit: Payer: BLUE CROSS/BLUE SHIELD | Admitting: Family Medicine

## 2017-12-22 ENCOUNTER — Encounter: Payer: Self-pay | Admitting: Family Medicine

## 2017-12-22 VITALS — BP 130/86 | HR 54 | Temp 97.8°F | Resp 12 | Ht 68.75 in | Wt 278.5 lb

## 2017-12-22 DIAGNOSIS — H8110 Benign paroxysmal vertigo, unspecified ear: Secondary | ICD-10-CM

## 2017-12-22 DIAGNOSIS — R42 Dizziness and giddiness: Secondary | ICD-10-CM

## 2017-12-22 DIAGNOSIS — R001 Bradycardia, unspecified: Secondary | ICD-10-CM | POA: Diagnosis not present

## 2017-12-22 LAB — COMPREHENSIVE METABOLIC PANEL
ALBUMIN: 4.3 g/dL (ref 3.5–5.2)
ALT: 21 U/L (ref 0–53)
AST: 16 U/L (ref 0–37)
Alkaline Phosphatase: 49 U/L (ref 39–117)
BUN: 15 mg/dL (ref 6–23)
CALCIUM: 9.4 mg/dL (ref 8.4–10.5)
CHLORIDE: 104 meq/L (ref 96–112)
CO2: 32 mEq/L (ref 19–32)
Creatinine, Ser: 0.84 mg/dL (ref 0.40–1.50)
GFR: 102.27 mL/min (ref >60.00)
Glucose, Bld: 94 mg/dL (ref 70–99)
POTASSIUM: 4.4 meq/L (ref 3.5–5.1)
Sodium: 141 mEq/L (ref 135–145)
Total Bilirubin: 1.1 mg/dL (ref 0.2–1.2)
Total Protein: 7 g/dL (ref 6.0–8.3)

## 2017-12-22 LAB — CBC
HEMATOCRIT: 46.2 % (ref 39.0–52.0)
Hemoglobin: 15.5 g/dL (ref 13.0–17.0)
MCHC: 33.5 g/dL (ref 30.0–36.0)
MCV: 85.7 fl (ref 78.0–100.0)
PLATELETS: 165 10*3/uL (ref 150.0–400.0)
RBC: 5.39 Mil/uL (ref 4.22–5.81)
RDW: 14.4 % (ref 11.5–15.5)
WBC: 6.7 10*3/uL (ref 4.0–10.5)

## 2017-12-22 MED ORDER — ONDANSETRON HCL 4 MG PO TABS: 4.0000 mg | ORAL_TABLET | Freq: Three times a day (TID) | ORAL | 0 refills | Status: AC | PRN

## 2017-12-22 MED ORDER — MECLIZINE HCL 25 MG PO TABS: 25.0000 mg | ORAL_TABLET | Freq: Three times a day (TID) | ORAL | 0 refills | Status: AC | PRN

## 2017-12-22 NOTE — Progress Notes (Signed)
ACUTE VISIT   HPI:  Chief Complaint  Patient presents with  . Dizziness    started Tuesday morning  . Dry Heaving    started Tuesday morning  . Nasal Congestion    noticed yesterday    [name] is a 52 y.o. male, who is here today complaining of 3 days of dizziness, which he describes as a spinning sensation. It is intermittent, exacerbated by head movement and when in bed, when he wakes up. Dizziness lasts for a few seconds/minutes.  Alleviated by staying still for a few seconds, he also feels better after eating breakfast.  Associated nausea,worse in the morning when he gets up.  He denies fever, chills, hearing loss, tinnitus, chest pain, dyspnea, or focal deficit.  History of palpitations, he noticed some last week.  Reports cardiac evaluation a few years ago and reported as negative.  He had a headache yesterday, occipital parietal, resolved. No recent URI but he mentions that he has had some nasal congestion. Symptoms otherwise stable. He has not try OTC treatment.  He has also had morning soft stools, states that he has "normal" bowel movements during the day. No abdominal pain or urinary symptoms.  In the past he has had similar symptoms.   Review of Systems  Constitutional: Negative for activity change, appetite change, fatigue and fever.  HENT: Positive for congestion. Negative for hearing loss, mouth sores, nosebleeds, sore throat and tinnitus.   Eyes: Negative for photophobia and visual disturbance.  Respiratory: Negative for cough, shortness of breath, wheezing and stridor.   Cardiovascular: Negative for chest pain, palpitations and leg swelling.  Gastrointestinal: Positive for diarrhea and nausea. Negative for abdominal pain, blood in stool and vomiting.  Endocrine: Negative for polydipsia, polyphagia and polyuria.  Genitourinary: Negative for decreased urine volume and hematuria.  Musculoskeletal: Negative for gait problem and  myalgias.  Skin: Negative for color change and rash.  Allergic/Immunologic: Negative for environmental allergies.  Neurological: Positive for dizziness and headaches. Negative for tremors, syncope, speech difficulty and weakness.  Psychiatric/Behavioral: Negative for confusion. The patient is not nervous/anxious.       No current outpatient medications on file prior to visit.   No current facility-administered medications on file prior to visit.      Past Medical History:  Diagnosis Date  . Chronic knee pain   . GERD (gastroesophageal reflux disease)   . Left shoulder pain   . Obesity    Allergies  Allergen Reactions  . Penicillins Cross Reactors Rash    Social History   Socioeconomic History  . Marital status: Married    Spouse name: Not on file  . Number of children: Not on file  . Years of education: Not on file  . Highest education level: Not on file  Occupational History  . Not on file  Social Needs  . Financial resource strain: Not on file  . Food insecurity:    Worry: Not on file    Inability: Not on file  . Transportation needs:    Medical: Not on file    Non-medical: Not on file  Tobacco Use  . Smoking status: Never Smoker  . Smokeless tobacco: Never Used  Substance and Sexual Activity  . Alcohol use: No    Alcohol/week: 0.0 oz  . Drug use: No  . Sexual activity: Not on file  Lifestyle  . Physical activity:    Days per week: Not on file    Minutes per session: Not  on file  . Stress: Not on file  Relationships  . Social connections:    Talks on phone: Not on file    Gets together: Not on file    Attends religious service: Not on file    Active member of club or organization: Not on file    Attends meetings of clubs or organizations: Not on file    Relationship status: Not on file  Other Topics Concern  . Not on file  Social History Narrative  . Not on file    Vitals:   12/22/17 1129  BP: 130/86  Pulse: (!) 54  Resp: 12  Temp: 97.8  F (36.6 C)  SpO2: 96%   Body mass index is 41.43 kg/m.   Physical Exam  Nursing note and vitals reviewed. Constitutional: He is oriented to person, place, and time. He appears well-developed. No distress.  HENT:  Head: Normocephalic and atraumatic.  Right Ear: Hearing, tympanic membrane, external ear and ear canal normal.  Left Ear: Hearing, tympanic membrane, external ear and ear canal normal.  Mouth/Throat: Oropharynx is clear and moist and mucous membranes are normal.  .Apley maneuver + Nystagmus present. Just did right side because spinning sensation.   Eyes: Pupils are equal, round, and reactive to light. Conjunctivae and EOM are normal.  Neck: Carotid bruit is not present.  Cardiovascular: Regular rhythm. Bradycardia present.  No murmur heard. Respiratory: Effort normal and breath sounds normal. No respiratory distress.  GI: Soft. He exhibits no mass. There is no hepatomegaly. There is no tenderness.  Musculoskeletal: He exhibits no edema.  Lymphadenopathy:    He has no cervical adenopathy.  Neurological: He is alert and oriented to person, place, and time. No cranial nerve deficit or sensory deficit. He displays a negative Romberg sign. Coordination and gait normal.  Skin: Skin is warm. No rash noted. No erythema.  Psychiatric: He has a normal mood and affect.  Well groomed,good eye contact.    ASSESSMENT AND PLAN:   Shane [name] was seen today for dizziness, dry heaving and nasal congestion.  Diagnoses and all orders for this visit:  Dizziness  We discussed possible etiologies. He was clearly instructed about warning signs. Further recommendations will be given according to lab results.  -     Comprehensive metabolic panel -     CBC  Benign paroxysmal positional vertigo, unspecified laterality  We discussed other possible etiologies of dizziness, Hx and examination today suggest benign vertigo. Explained that problem can be recurrent. Fall  prevention. Vestibular exercises recommended, he prefers to hold on PT referral, so handout with Semont maneuvers given. Meclizine 25 mg tid prn, some side effects discussed. Instructed about warning signs. F/U in 10 to 14 days with PCP.  -     meclizine (ANTIVERT) 25 MG tablet; Take 1 tablet (25 mg total) by mouth 3 (three) times daily as needed for up to 14 days for dizziness. -     ondansetron (ZOFRAN) 4 MG tablet; Take 1 tablet (4 mg total) by mouth every 8 (eight) hours as needed for up to 5 days for nausea or vomiting.  Bradycardia, sinus  Mild. I do not think this is related to his dizziness. She had some cardiac work-up in 09/2013, Holter monitor showed some episodes of bradycardia. He was instructed about warning signs. Follow-up with PCP.    Return in about 10 days (around 01/01/2018) for vertigo with PCP 10-14 days.     -Shane [name] was advised to seek immediate medical attention  if sudden worsening symptoms, he voices understanding.      Betty G. Martinique, MD  [facility]. [place] office.

## 2017-12-22 NOTE — Patient Instructions (Addendum)
  [name] I have seen you today for an acute visit.  A few things to remember from today's visit:   Dizziness - Plan: Comprehensive metabolic panel, CBC  Benign paroxysmal positional vertigo, unspecified laterality - Plan: meclizine (ANTIVERT) 25 MG tablet, ondansetron (ZOFRAN) 4 MG tablet  Bradycardia, sinus   Medications prescribed today are intended for short period of time and will not be refill upon request, a follow up appointment might be necessary to discuss continuation of of treatment if appropriate.   Dizziness is a perception of movement, it is sometimes difficult to describe and can be  caused by different problems, most benign but others can be life threaten.  Vertigo is the most common cause of dizziness, usually related with inner ear and can be associated with nausea, vomiting, and unbalance sensation. It can be complicated by falls due to lose of balance; so fall precautions are very important.  Most of the time dizziness is benign, usually intermittent, last a few seconds at the time and aggravated by certain positions. It usually resolves in a few weeks without residual effect but it could be recurrent.  Sometimes blood work is ordered to evaluate for other possible causes.  Dizziness can also be caused by certain medications, dehydration, migraines, and strokes.  Medication prescribed for vertigo, Meclizine, causes drowsiness/sleepiness, so frequently I recommended taking it at bedtime. I also recommend what we called vestibular exercise, sometimes can be done at home (Modified Semont maneuvers) other times I refer patients to vestibular rehabilitation.   Seek immediate medical attention if: New severe headache, dobble vision, fever (100 F or more), associated numbness/tingling, focal weakness, persistent vomiting, not able to walk, or sudden worsening symptoms.   In general please monitor for signs of worsening symptoms and seek immediate medical  attention if any concerning.  If symptoms are not resolved in 1-2 weeks you should schedule a follow up appointment with your doctor, before if needed.  I hope you get better soon!

## 2017-12-25 ENCOUNTER — Telehealth: Payer: Self-pay | Admitting: *Deleted

## 2017-12-25 ENCOUNTER — Encounter: Payer: Self-pay | Admitting: Internal Medicine

## 2017-12-25 NOTE — Telephone Encounter (Signed)
Copied from [place] [id number]. Topic: General - Other >> Dec 25, 2017  2:18 PM Boyd Kerbs wrote: Reason for CRM:   Pt. Drives for work and and is still dizzy works Research scientist (life sciences) and asking if can get dr. note to be off work (left up to dr. On how many days off.   He works 4 day work weeks)  He is doing his exercise and taking medication but still having some dizziness.  Please call when ready and they will pick up .

## 2017-12-25 NOTE — Telephone Encounter (Signed)
Letter dictated and available for pickup

## 2017-12-25 NOTE — Telephone Encounter (Signed)
Message sent to Dr. Jordan for review and approval. 

## 2017-12-26 NOTE — Telephone Encounter (Signed)
Left message informing that letter was ready for pick up.

## 2017-12-26 NOTE — Telephone Encounter (Signed)
Tried calling patient, unable to leave message, mailbox not set up.

## 2018-01-04 ENCOUNTER — Encounter: Payer: Self-pay | Admitting: Internal Medicine

## 2018-01-04 ENCOUNTER — Ambulatory Visit: Payer: BLUE CROSS/BLUE SHIELD | Admitting: Internal Medicine

## 2018-01-04 VITALS — BP 122/82 | HR 58 | Temp 98.3°F | Resp 14 | Ht 69.0 in | Wt 281.0 lb

## 2018-01-04 DIAGNOSIS — Z0000 Encounter for general adult medical examination without abnormal findings: Secondary | ICD-10-CM

## 2018-01-04 DIAGNOSIS — I10 Essential (primary) hypertension: Secondary | ICD-10-CM

## 2018-01-04 DIAGNOSIS — H8110 Benign paroxysmal vertigo, unspecified ear: Secondary | ICD-10-CM | POA: Diagnosis not present

## 2018-01-04 NOTE — Patient Instructions (Signed)
Schedule your colonoscopy to help detect colon cancer.  Limit your sodium (Salt) intake    It is important that you exercise regularly, at least 20 minutes 3 to 4 times per week.  If you develop chest pain or shortness of breath seek  medical attention.  You need to lose weight.  Consider a lower calorie diet and regular exercise.

## 2018-01-04 NOTE — Progress Notes (Signed)
Subjective:    Patient ID: Shane [name], male    DOB: Nov 25, 1965, 52 y.o.   MRN: [id number]  HPI  52 year old patient who is seen today for follow-up of benign positional vertigo.  He has been symptom-free for about 3 days.  He was given repositioning exercises to perform which he felt was helpful He has history of obesity.  He has had some issues with mild hypertension the past but presently is diet-controlled.  Blood pressure controlled today He did have a colonoscopy in 2004 due to rectal bleeding Presently takes no medications.  He generally feels well. He states that he has been evaluated for OSA with normal results  Past Medical History:  Diagnosis Date  . Chronic knee pain   . GERD (gastroesophageal reflux disease)   . Left shoulder pain   . Obesity      Social History   Socioeconomic History  . Marital status: Married    Spouse name: Not on file  . Number of children: Not on file  . Years of education: Not on file  . Highest education level: Not on file  Occupational History  . Not on file  Social Needs  . Financial resource strain: Not on file  . Food insecurity:    Worry: Not on file    Inability: Not on file  . Transportation needs:    Medical: Not on file    Non-medical: Not on file  Tobacco Use  . Smoking status: Never Smoker  . Smokeless tobacco: Never Used  Substance and Sexual Activity  . Alcohol use: No    Alcohol/week: 0.0 oz  . Drug use: No  . Sexual activity: Not on file  Lifestyle  . Physical activity:    Days per week: Not on file    Minutes per session: Not on file  . Stress: Not on file  Relationships  . Social connections:    Talks on phone: Not on file    Gets together: Not on file    Attends religious service: Not on file    Active member of club or organization: Not on file    Attends meetings of clubs or organizations: Not on file    Relationship status: Not on file  . Intimate partner violence:    Fear of current or ex  partner: Not on file    Emotionally abused: Not on file    Physically abused: Not on file    Forced sexual activity: Not on file  Other Topics Concern  . Not on file  Social History Narrative  . Not on file    Past Surgical History:  Procedure Laterality Date  . carpal pummel repair    . CERVICAL DISC SURGERY  2003   per Dr. Sherwood Gambler     Family History  Problem Relation Age of Onset  . Heart disease Mother        mitrial valve repair, CABG   . Prostate cancer Father   . Hypertension Father     Allergies  Allergen Reactions  . Penicillins Cross Reactors Rash    Current Outpatient Medications on File Prior to Visit  Medication Sig Dispense Refill  . meclizine (ANTIVERT) 25 MG tablet Take 1 tablet (25 mg total) by mouth 3 (three) times daily as needed for up to 14 days for dizziness. 45 tablet 0   No current facility-administered medications on file prior to visit.     BP 122/82 (BP Location: Left Arm, Patient Position: Sitting, Cuff  Size: Large)   Pulse (!) 58   Temp 98.3 F (36.8 C) (Oral)   Resp 14   Ht 5\' 9"  (1.753 m)   Wt 281 lb (127.5 kg)   SpO2 97%   BMI 41.50 kg/m     Review of Systems  Constitutional: Negative for appetite change, chills, fatigue and fever.  HENT: Negative for congestion, dental problem, ear pain, hearing loss, sore throat, tinnitus, trouble swallowing and voice change.   Eyes: Negative for pain, discharge and visual disturbance.  Respiratory: Negative for cough, chest tightness, wheezing and stridor.   Cardiovascular: Negative for chest pain, palpitations and leg swelling.  Gastrointestinal: Negative for abdominal distention, abdominal pain, blood in stool, constipation, diarrhea, nausea and vomiting.  Genitourinary: Negative for difficulty urinating, discharge, flank pain, genital sores, hematuria and urgency.  Musculoskeletal: Negative for arthralgias, back pain, gait problem, joint swelling, myalgias and neck stiffness.  Skin:  Negative for rash.  Neurological: Positive for dizziness. Negative for syncope, speech difficulty, weakness, numbness and headaches.  Hematological: Negative for adenopathy. Does not bruise/bleed easily.  Psychiatric/Behavioral: Negative for behavioral problems and dysphoric mood. The patient is not nervous/anxious.        Objective:   Physical Exam  Constitutional: He is oriented to person, place, and time. He appears well-developed. No distress.  Blood pressure 120/78  HENT:  Head: Normocephalic.  Right Ear: External ear normal.  Left Ear: External ear normal.  Eyes: Conjunctivae and EOM are normal.  Neck: Normal range of motion.  Cardiovascular: Normal rate and normal heart sounds.  Pulmonary/Chest: Breath sounds normal.  Abdominal: Bowel sounds are normal.  Musculoskeletal: Normal range of motion. He exhibits no edema or tenderness.  Neurological: He is alert and oriented to person, place, and time.  Psychiatric: He has a normal mood and affect. His behavior is normal.          Assessment & Plan:   History of benign positional vertigo stable Obesity weight loss encouraged History of hypertension.  Blood pressure presently well controlled Health maintenance.  It has been 15 years since his last colonoscopy.  We will schedule at his convenience  Patient has been asked to return to establish with a new provider in approximately 6 months  Marletta Lor

## 2018-03-23 ENCOUNTER — Encounter: Payer: Self-pay | Admitting: Gastroenterology

## 2018-03-26 ENCOUNTER — Encounter: Payer: Self-pay | Admitting: Internal Medicine

## 2018-03-26 ENCOUNTER — Ambulatory Visit: Payer: BLUE CROSS/BLUE SHIELD | Admitting: Internal Medicine

## 2018-03-26 VITALS — BP 110/78 | HR 67 | Temp 98.7°F | Wt 285.0 lb

## 2018-03-26 DIAGNOSIS — K625 Hemorrhage of anus and rectum: Secondary | ICD-10-CM | POA: Diagnosis not present

## 2018-03-26 MED ORDER — HYDROCORTISONE ACE-PRAMOXINE 1-1 % RE CREA: 1.0000 "application " | TOPICAL_CREAM | Freq: Two times a day (BID) | RECTAL | 3 refills | Status: DC

## 2018-03-26 NOTE — Progress Notes (Signed)
Subjective:    Patient ID: Shane [name], male    DOB: 17-Mar-1966, 52 y.o.   MRN: [id number]  HPI 52 year old patient who presents with a 4-week history of rectal pain with intermittent bright red rectal bleeding. Pain is aggravated by bowel movements.  Bleeding is primarily noted on the tissue paper.  Patient concerned about possible hemorrhoids. He also describes some mucus type discharge resulting in some staining of his undergarments.  The patient is scheduled for colonoscopy electively next month  Past Medical History:  Diagnosis Date  . Chronic knee pain   . GERD (gastroesophageal reflux disease)   . Left shoulder pain   . Obesity      Social History   Socioeconomic History  . Marital status: Married    Spouse name: Not on file  . Number of children: Not on file  . Years of education: Not on file  . Highest education level: Not on file  Occupational History  . Not on file  Social Needs  . Financial resource strain: Not on file  . Food insecurity:    Worry: Not on file    Inability: Not on file  . Transportation needs:    Medical: Not on file    Non-medical: Not on file  Tobacco Use  . Smoking status: Never Smoker  . Smokeless tobacco: Never Used  Substance and Sexual Activity  . Alcohol use: No    Alcohol/week: 0.0 standard drinks  . Drug use: No  . Sexual activity: Not on file  Lifestyle  . Physical activity:    Days per week: Not on file    Minutes per session: Not on file  . Stress: Not on file  Relationships  . Social connections:    Talks on phone: Not on file    Gets together: Not on file    Attends religious service: Not on file    Active member of club or organization: Not on file    Attends meetings of clubs or organizations: Not on file    Relationship status: Not on file  . Intimate partner violence:    Fear of current or ex partner: Not on file    Emotionally abused: Not on file    Physically abused: Not on file    Forced sexual  activity: Not on file  Other Topics Concern  . Not on file  Social History Narrative  . Not on file    Past Surgical History:  Procedure Laterality Date  . carpal pummel repair    . CERVICAL DISC SURGERY  2003   per Dr. Sherwood Gambler     Family History  Problem Relation Age of Onset  . Heart disease Mother        mitrial valve repair, CABG   . Prostate cancer Father   . Hypertension Father     Allergies  Allergen Reactions  . Penicillins Cross Reactors Rash    No current outpatient medications on file prior to visit.   No current facility-administered medications on file prior to visit.     BP 110/78 (BP Location: Right Arm, Patient Position: Sitting, Cuff Size: Large)   Pulse 67   Temp 98.7 F (37.1 C) (Oral)   Wt 285 lb (129.3 kg)   SpO2 95%   BMI 42.09 kg/m      Review of Systems  Gastrointestinal: Positive for blood in stool and rectal pain.       Objective:   Physical Exam  Constitutional: He appears well-developed  and well-nourished. No distress.  Genitourinary: Rectal exam shows guaiac positive stool.  Genitourinary Comments: Rectal exam was limited due to patient discomfort and tight rectal tone. Small external hemorrhoidal tag Stool was hematest positive          Assessment & Plan:  4-week history of rectal pain with occasional bright red rectal bleeding. Possible anal fissure.  Multiple diagnostic possibilities;   will treat with local hydrocortisone-pramoxine cream  Colonoscopy as scheduled next month  Marletta Lor

## 2018-03-26 NOTE — Patient Instructions (Signed)
Colonoscopy as scheduled  Call or return to clinic prn if these symptoms worsen or fail to improve as anticipated.

## 2018-04-27 ENCOUNTER — Ambulatory Visit (AMBULATORY_SURGERY_CENTER): Payer: Self-pay

## 2018-04-27 VITALS — Ht 71.0 in | Wt 286.0 lb

## 2018-04-27 DIAGNOSIS — Z1211 Encounter for screening for malignant neoplasm of colon: Secondary | ICD-10-CM

## 2018-04-27 MED ORDER — NA SULFATE-K SULFATE-MG SULF 17.5-3.13-1.6 GM/177ML PO SOLN: 1.0000 | Freq: Once | ORAL | 0 refills | Status: AC

## 2018-04-27 NOTE — Progress Notes (Signed)
Denies allergies to eggs or soy products. Denies complication of anesthesia or sedation. Denies use of weight loss medication. Denies use of O2.   Emmi instructions declined.   Patient was instructed to drink plenty of fluids prior to the procedure. Patient was informed that it will help him not to dehydrate, his prep will work more efficiently and it will help to start an IV when he comes to his procedure. Patient verbalizes understanding.

## 2018-04-30 ENCOUNTER — Encounter: Payer: Self-pay | Admitting: Gastroenterology

## 2018-05-10 ENCOUNTER — Ambulatory Visit (AMBULATORY_SURGERY_CENTER): Payer: BLUE CROSS/BLUE SHIELD | Admitting: Gastroenterology

## 2018-05-10 ENCOUNTER — Encounter: Payer: Self-pay | Admitting: Gastroenterology

## 2018-05-10 VITALS — BP 136/80 | HR 62 | Temp 97.1°F | Resp 7 | Ht 69.0 in | Wt 285.0 lb

## 2018-05-10 DIAGNOSIS — K635 Polyp of colon: Secondary | ICD-10-CM | POA: Diagnosis not present

## 2018-05-10 DIAGNOSIS — D124 Benign neoplasm of descending colon: Secondary | ICD-10-CM

## 2018-05-10 DIAGNOSIS — D120 Benign neoplasm of cecum: Secondary | ICD-10-CM

## 2018-05-10 DIAGNOSIS — D123 Benign neoplasm of transverse colon: Secondary | ICD-10-CM | POA: Diagnosis not present

## 2018-05-10 DIAGNOSIS — Z1211 Encounter for screening for malignant neoplasm of colon: Secondary | ICD-10-CM

## 2018-05-10 HISTORY — PX: COLONOSCOPY: SHX174

## 2018-05-10 MED ORDER — SODIUM CHLORIDE 0.9 % IV SOLN: 500.0000 mL | Freq: Once | INTRAVENOUS | Status: DC

## 2018-05-10 NOTE — Op Note (Signed)
[facility] Patient Name: Shane [name] Procedure Date: 05/10/2018 8:22 AM MRN: [id number] Endoscopist: Mauri Pole , MD Age: 52 Referring MD:  Date of Birth: May 29, 1966 Gender: Male Account #: [id number] Procedure:                Colonoscopy Indications:              Screening for colorectal malignant neoplasm Medicines:                Monitored Anesthesia Care Procedure:                Pre-Anesthesia Assessment:                           - Prior to the procedure, a History and Physical                            was performed, and patient medications and                            allergies were reviewed. The patient's tolerance of                            previous anesthesia was also reviewed. The risks                            and benefits of the procedure and the sedation                            options and risks were discussed with the patient.                            All questions were answered, and informed consent                            was obtained. Prior Anticoagulants: The patient has                            taken no previous anticoagulant or antiplatelet                            agents. ASA Grade Assessment: II - A patient with                            mild systemic disease. After reviewing the risks                            and benefits, the patient was deemed in                            satisfactory condition to undergo the procedure.                           After obtaining informed consent, the colonoscope  was passed under direct vision. Throughout the                            procedure, the patient's blood pressure, pulse, and                            oxygen saturations were monitored continuously. The                            Colonoscope was introduced through the anus and                            advanced to the the cecum, identified by                            appendiceal orifice  and ileocecal valve. The                            colonoscopy was performed without difficulty. The                            patient tolerated the procedure well. The quality                            of the bowel preparation was excellent. The                            terminal ileum, ileocecal valve, appendiceal                            orifice, and rectum were photographed. Scope In: 8:34:57 AM Scope Out: 8:49:53 AM Scope Withdrawal Time: 0 hours 11 minutes 15 seconds  Total Procedure Duration: 0 hours 14 minutes 56 seconds  Findings:                 The perianal and digital rectal examinations were                            normal.                           Two sessile polyps were found in the descending                            colon and cecum. The polyps were 5 to 10 mm in                            size. These polyps were removed with a cold snare.                            Resection and retrieval were complete.                           Two sessile polyps were found in the transverse  colon and cecum. The polyps were 1 to 2 mm in size.                            These polyps were removed with a cold biopsy                            forceps. Resection and retrieval were complete.                           Non-bleeding internal hemorrhoids were found during                            retroflexion. The hemorrhoids were small. Complications:            No immediate complications. Estimated Blood Loss:     Estimated blood loss was minimal. Impression:               - Two 5 to 10 mm polyps in the descending colon and                            in the cecum, removed with a cold snare. Resected                            and retrieved.                           - Two 1 to 2 mm polyps in the transverse colon and                            in the cecum, removed with a cold biopsy forceps.                            Resected and retrieved.                            - Non-bleeding internal hemorrhoids. Recommendation:           - Patient has a contact number available for                            emergencies. The signs and symptoms of potential                            delayed complications were discussed with the                            patient. Return to normal activities tomorrow.                            Written discharge instructions were provided to the                            patient.                           -  Resume previous diet.                           - Continue present medications.                           - Await pathology results.                           - Repeat colonoscopy in 3 - 5 years for                            surveillance based on pathology results. Mauri Pole, MD 05/10/2018 8:54:33 AM This report has been signed electronically.

## 2018-05-10 NOTE — Patient Instructions (Signed)
YOU HAD AN ENDOSCOPIC PROCEDURE TODAY AT THE [facility]:   Refer to the procedure report that was given to you for any specific questions about what was found during the examination.  If the procedure report does not answer your questions, please call your gastroenterologist to clarify.  If you requested that your care partner not be given the details of your procedure findings, then the procedure report has been included in a sealed envelope for you to review at your convenience later.  YOU SHOULD EXPECT: Some feelings of bloating in the abdomen. Passage of more gas than usual.  Walking can help get rid of the air that was put into your GI tract during the procedure and reduce the bloating. If you had a lower endoscopy (such as a colonoscopy or flexible sigmoidoscopy) you may notice spotting of blood in your stool or on the toilet paper. If you underwent a bowel prep for your procedure, you may not have a normal bowel movement for a few days.  Please Note:  You might notice some irritation and congestion in your nose or some drainage.  This is from the oxygen used during your procedure.  There is no need for concern and it should clear up in a day or so.  SYMPTOMS TO REPORT IMMEDIATELY:   Following lower endoscopy (colonoscopy or flexible sigmoidoscopy):  Excessive amounts of blood in the stool  Significant tenderness or worsening of abdominal pains  Swelling of the abdomen that is new, acute  Fever of 100F or higher   For urgent or emergent issues, a gastroenterologist can be reached at any hour by calling [phone].   DIET:  We do recommend a small meal at first, but then you may proceed to your regular diet.  Drink plenty of fluids but you should avoid alcoholic beverages for 24 hours.  ACTIVITY:  You should plan to take it easy for the rest of today and you should NOT DRIVE or use heavy machinery until tomorrow (because of the sedation medicines used during the test).     FOLLOW UP: Our staff will call the number listed on your records the next business day following your procedure to check on you and address any questions or concerns that you may have regarding the information given to you following your procedure. If we do not reach you, we will leave a message.  However, if you are feeling well and you are not experiencing any problems, there is no need to return our call.  We will assume that you have returned to your regular daily activities without incident.  If any biopsies were taken you will be contacted by phone or by letter within the next 1-3 weeks.  Please call us at [phone] if you have not heard about the biopsies in 3 weeks.    SIGNATURES/CONFIDENTIALITY: You and/or your care partner have signed paperwork which will be entered into your electronic medical record.  These signatures attest to the fact that that the information above on your After Visit Summary has been reviewed and is understood.  Full responsibility of the confidentiality of this discharge information lies with you and/or your care-partner.  Read all handouts given to you by your recovery room nurse. 

## 2018-05-10 NOTE — Progress Notes (Signed)
Called to room to assist during endoscopic procedure.  Patient ID and intended procedure confirmed with present staff. Received instructions for my participation in the procedure from the performing physician.  

## 2018-05-10 NOTE — Progress Notes (Signed)
A and O x3. Report to RN. Tolerated MAC anesthesia well.

## 2018-05-11 ENCOUNTER — Telehealth: Payer: Self-pay

## 2018-05-11 NOTE — Telephone Encounter (Signed)
  Follow up Call-  Call back number 05/10/2018  Post procedure Call Back phone  # [phone]  Permission to leave phone message Yes  Some recent data might be hidden     Patient questions:  Do you have a fever, pain , or abdominal swelling? No. Pain Score  0 *  Have you tolerated food without any problems? Yes.    Have you been able to return to your normal activities? Yes.    Do you have any questions about your discharge instructions: Diet   No. Medications  No. Follow up visit  No.  Do you have questions or concerns about your Care? No.  Actions: * If pain score is 4 or above: No action needed, pain <4.

## 2018-05-18 ENCOUNTER — Encounter: Payer: Self-pay | Admitting: Gastroenterology

## 2018-09-04 ENCOUNTER — Encounter: Payer: Self-pay | Admitting: *Deleted

## 2018-09-04 ENCOUNTER — Ambulatory Visit (INDEPENDENT_AMBULATORY_CARE_PROVIDER_SITE_OTHER): Payer: BLUE CROSS/BLUE SHIELD | Admitting: Family Medicine

## 2018-09-04 ENCOUNTER — Encounter: Payer: Self-pay | Admitting: Family Medicine

## 2018-09-04 VITALS — BP 120/80 | HR 79 | Temp 98.5°F | Resp 12 | Ht 69.0 in | Wt 280.4 lb

## 2018-09-04 DIAGNOSIS — R159 Full incontinence of feces: Secondary | ICD-10-CM | POA: Diagnosis not present

## 2018-09-04 DIAGNOSIS — R350 Frequency of micturition: Secondary | ICD-10-CM | POA: Diagnosis not present

## 2018-09-04 DIAGNOSIS — R52 Pain, unspecified: Secondary | ICD-10-CM | POA: Diagnosis not present

## 2018-09-04 LAB — CBC WITH DIFFERENTIAL/PLATELET
Basophils Absolute: 0.1 10*3/uL (ref 0.0–0.1)
Basophils Relative: 1 % (ref 0.0–3.0)
Eosinophils Absolute: 0 10*3/uL (ref 0.0–0.7)
Eosinophils Relative: 0.6 % (ref 0.0–5.0)
HCT: 47.3 % (ref 39.0–52.0)
Hemoglobin: 15.9 g/dL (ref 13.0–17.0)
Lymphocytes Relative: 29.7 % (ref 12.0–46.0)
Lymphs Abs: 2.1 10*3/uL (ref 0.7–4.0)
MCHC: 33.6 g/dL (ref 30.0–36.0)
MCV: 85.1 fl (ref 78.0–100.0)
Monocytes Absolute: 0.6 10*3/uL (ref 0.1–1.0)
Monocytes Relative: 8.9 % (ref 3.0–12.0)
Neutro Abs: 4.2 10*3/uL (ref 1.4–7.7)
Neutrophils Relative %: 59.8 % (ref 43.0–77.0)
Platelets: 199 10*3/uL (ref 150.0–400.0)
RBC: 5.56 Mil/uL (ref 4.22–5.81)
RDW: 14.5 % (ref 11.5–15.5)
WBC: 7 10*3/uL (ref 4.0–10.5)

## 2018-09-04 LAB — BASIC METABOLIC PANEL
BUN: 15 mg/dL (ref 6–23)
CALCIUM: 9.2 mg/dL (ref 8.4–10.5)
CO2: 28 mEq/L (ref 19–32)
Chloride: 102 mEq/L (ref 96–112)
Creatinine, Ser: 0.82 mg/dL (ref 0.40–1.50)
GFR: 98.66 mL/min (ref >60.00)
Glucose, Bld: 81 mg/dL (ref 70–99)
Potassium: 4.3 mEq/L (ref 3.5–5.1)
SODIUM: 138 meq/L (ref 135–145)

## 2018-09-04 LAB — URINALYSIS, ROUTINE W REFLEX MICROSCOPIC
BILIRUBIN URINE: NEGATIVE
Hgb urine dipstick: NEGATIVE
KETONES UR: NEGATIVE
Leukocytes,Ua: NEGATIVE
Nitrite: NEGATIVE
Specific Gravity, Urine: 1.025 (ref 1.000–1.030)
Total Protein, Urine: NEGATIVE
URINE GLUCOSE: NEGATIVE
Urobilinogen, UA: 1 (ref 0.0–1.0)
pH: 7 (ref 5.0–8.0)

## 2018-09-04 LAB — POCT INFLUENZA A/B
Influenza A, POC: NEGATIVE
Influenza B, POC: NEGATIVE

## 2018-09-04 LAB — PSA: PSA: 0.38 ng/mL (ref 0.10–4.00)

## 2018-09-04 NOTE — Patient Instructions (Addendum)
Shane [name] I have seen you today for an acute visit.  A few things to remember from today's visit:   Incontinence of feces, unspecified fecal incontinence type  Generalized body aches - Plan: POC Influenza A/B, Basic metabolic panel, CBC with Differential/Platelet  Other fatigue  Urinary frequency - Plan: Urinalysis, Routine w reflex microscopic, PSA    Monitor for new symptoms, symptoms you reported today may be related with a viral illness. You can take Tylenol 500 mg 3-4 times per day as needed. Adequate hydration.   In regard to the stool incontinence, I recommend trying Metamucil and adequate hydration.  Fecal Incontinence Fecal incontinence, also called accidental bowel leakage, is not being able to control your bowels. This condition happens because the nerves or muscles around the anus do not work the way they should. This affects their ability to hold stool (feces). What are the causes? This condition may be caused by:  Damage to the muscles at the end of the rectum (sphincter).  Damage to the nerves that control bowel movements.  Diarrhea.  Chronic constipation.  Pelvic floor dysfunction. This means the muscles in the pelvis do not work well.  Loss of bowel storage capacity. This occurs when the rectum can no longer stretch in size in order to store feces.  Inflammatory bowel disease (IBD), such as Crohn's disease.  Irritable bowel syndrome (IBS). What increases the risk? You are more likely to develop this condition if you:  Were born with bowels or a pelvis that did not form correctly.  Have had rectal surgery.  Have had radiation treatment for certain cancers.  Have been pregnant, had a vaginal delivery, or had surgery that damaged the pelvic floor muscles.  Had a complicated childbirth, spinal cord injury, or other trauma that caused nerve damage.  Have a condition that can affect nerve function, such as diabetes, Parkinson's disease,  or multiple sclerosis.  Have a condition where the rectum drops down into the anus or vagina (prolapse).  Are 42 years of age or older. What are the signs or symptoms? The main symptom of this condition is not being able to control your bowels. You also might not be able to get to the bathroom before a bowel movement. How is this diagnosed? This condition is diagnosed with a medical history and physical exam. You may also have other tests, including:  Blood tests.  Urine tests.  A rectal exam.  Ultrasound.  MRI.  Colonoscopy. This is an exam that looks at your large intestine (colon).  Anal manometry. This is a test that measures the strength of the anal sphincter.  Anal electromyogram (EMG). This is a test that uses small electrodes to check for nerve damage. How is this treated? Treatment for this condition depends on the cause and severity. Treatment may also focus on addressing any underlying causes of this condition. Treatment may include:  Medicines. This may include medicines to: ? Prevent diarrhea. ? Help with constipation (bulk-forming laxatives). ? Treat any underlying conditions.  Biofeedback therapy. This can help to retrain muscles that are affected.  Fiber supplements. These can help manage your bowel movements.  Nerve stimulation.  Injectable gel to promote tissue growth and better muscle control.  Surgery. You may need: ? Sphincter repair surgery. ? Diversion surgery. This procedure lets feces pass out of your body through a hole in your abdomen. Follow these instructions at home: Eating and drinking   Follow instructions from your health care provider about any eating or  drinking restrictions. ? Work with a dietitian to come up with a healthy diet that will help you avoid the foods that can make your condition worse. ? Keep a diet diary to find out which foods or drinks could be making your condition worse.  Drink enough fluid to keep your urine  pale yellow. Lifestyle  Do not use any products that contain nicotine or tobacco, such as cigarettes and e-cigarettes. If you need help quitting, ask your health care provider. This may help your condition.  If you are overweight, talk with your health care provider about how to safely lose weight. This may help your condition.  Increase your physical activity as told by your health care provider. This may help your condition. Always talk with your health care provider before starting a new exercise program.  Carry a change of clothes and supplies to clean up quickly if you have an episode of fetal incontinence.  Consider joining a fecal incontinence support group. You can find a support group online or in your local community. General instructions   Take over-the-counter and prescription medicines only as told by your health care provider. This includes any supplements.  Apply a moisture barrier, such as petroleum jelly, to your rectum. This protects the skin from irritation caused by ongoing leaking or diarrhea.  Tell your health care provider if you are upset or depressed about your condition.  Keep all follow-up visits as told by your health care provider. This is important. Where to find more information  International Foundation for Functional Gastrointestinal Disorders: iffgd.[place] [facility]: patients.gi.org Contact a health care provider if:  You have a fever.  You have redness, swelling, or pain around your rectum.  Your pain is getting worse or you lose feeling in your rectal area.  You have blood in your stool.  You feel sad or hopeless.  You avoid social or work situations. Get help right away if:  You stop having bowel movements.  You cannot eat or drink without vomiting.  You have rectal bleeding that does not stop.  You have severe pain that is getting worse.  You have symptoms of dehydration, including: ? Sleepiness or  fatigue. ? Producing little or no urine, tears, or sweat. ? Dizziness. ? Dry mouth. ? Unusual irritability. ? Headache. ? Inability to think clearly. Summary  Fecal incontinence, also called accidental bowel leakage, is not being able to control your bowels. This condition happens because the nerves or muscles around the anus do not work the way they should.  Treatment varies depending on the cause and severity of your condition. Treatment may also focus on addressing any underlying causes of this condition.  Follow instructions from your health care provider about any eating or drinking restrictions, lifestyle changes, and skin care.  Take over-the-counter and prescription medicines only as told by your health care provider. This includes any supplements.  Tell your health care provider if your symptoms worsen or if you are upset or depressed about your condition. This information is not intended to replace advice given to you by your health care provider. Make sure you discuss any questions you have with your health care provider. Document Released: 06/08/2004 Document Revised: 11/09/2017 Document Reviewed: 11/09/2017 Elsevier Interactive Patient Education  2019 [employer].  In general please monitor for signs of worsening symptoms and seek immediate medical attention if any concerning.  If symptoms are not resolved in 1-2weeks you should schedule a follow up appointment with your doctor,  before if needed.  I hope you get better soon!

## 2018-09-04 NOTE — Progress Notes (Signed)
ACUTE VISIT   HPI:  Chief Complaint  Patient presents with  . Fatigue    sx started a couple of days ago, feeling really tired  . Generalized Body Aches    [name] is a 53 y.o. male, who is here today complaining of 2 days of above symptoms. He is concerned about possible influenza. Subjective fever.  He has not noted respiratory symptoms except for mild nasal congestion and frontal pressure headache, feels "stopped up." He attributed symptoms to seasonal allergies.  Reports "softer" stools than usual, later during visit he states that he has soft stools intermittently for a while. Alternates between soft and hard stools. Intermittent rectal bleed with straining.  No history of sick contacts or recent travel. He has not tried OTC medications.  When asked about urinary symptoms,he reports increased in urinary frequency for months now. He has not identified exacerbating or alleviating factors.  He denies dysuria,ungency,incontinence,or gross hematuria.  He is also complaining about stool incontinence that started after using steroid cream for hemorrhoids. He discontinued medication but is still having problem. He has to use a small "tampon."  States that he had colonoscopy done recently, polypectomy, he has not heard about pathology results. 05/10/2018 colonoscopy showed polyps and nonbleeding internal hemorrhoids.  It was recommended 3 to 5 years follow-up depending on pathology results, tubular adenoma.   Review of Systems  Constitutional: Positive for activity change, appetite change, chills, fatigue and fever.  HENT: Positive for congestion. Negative for ear pain, mouth sores, sore throat and trouble swallowing.   Respiratory: Negative for cough, shortness of breath and wheezing.   Cardiovascular: Negative for leg swelling.  Gastrointestinal: Positive for anal bleeding (Occasionally, associated with straining.) and diarrhea. Negative for abdominal  pain, blood in stool, nausea and vomiting.  Genitourinary: Negative for decreased urine volume, dysuria and hematuria.  Musculoskeletal: Positive for arthralgias and myalgias. Negative for gait problem.  Skin: Negative for rash.  Allergic/Immunologic: Positive for environmental allergies.  Neurological: Negative for weakness and headaches.    Current Outpatient Medications on File Prior to Visit  Medication Sig Dispense Refill  . pramoxine-hydrocortisone (PROCTOCREAM-HC) 1-1 % rectal cream Place 1 application rectally 2 (two) times daily. 30 g 3   No current facility-administered medications on file prior to visit.      Past Medical History:  Diagnosis Date  . Chronic knee pain   . GERD (gastroesophageal reflux disease)   . Left shoulder pain   . Obesity    Allergies  Allergen Reactions  . Penicillins Cross Reactors Rash    Social History   Socioeconomic History  . Marital status: Married    Spouse name: Not on file  . Number of children: Not on file  . Years of education: Not on file  . Highest education level: Not on file  Occupational History  . Not on file  Social Needs  . Financial resource strain: Not on file  . Food insecurity:    Worry: Not on file    Inability: Not on file  . Transportation needs:    Medical: Not on file    Non-medical: Not on file  Tobacco Use  . Smoking status: Never Smoker  . Smokeless tobacco: Never Used  Substance and Sexual Activity  . Alcohol use: No    Alcohol/week: 0.0 standard drinks  . Drug use: No  . Sexual activity: Not on file  Lifestyle  . Physical activity:    Days per week: Not on  file    Minutes per session: Not on file  . Stress: Not on file  Relationships  . Social connections:    Talks on phone: Not on file    Gets together: Not on file    Attends religious service: Not on file    Active member of club or organization: Not on file    Attends meetings of clubs or organizations: Not on file    Relationship  status: Not on file  Other Topics Concern  . Not on file  Social History Narrative  . Not on file    Vitals:   09/04/18 0921  BP: 120/80  Pulse: 79  Resp: 12  Temp: 98.5 F (36.9 C)  SpO2: 95%   Body mass index is 41.4 kg/m.   Physical Exam  Nursing note and vitals reviewed. Constitutional: He is oriented to person, place, and time. He appears well-developed. No distress.  HENT:  Head: Normocephalic and atraumatic.  Nose: Right sinus exhibits no maxillary sinus tenderness and no frontal sinus tenderness. Left sinus exhibits no maxillary sinus tenderness and no frontal sinus tenderness.  Mouth/Throat: Oropharynx is clear and moist and mucous membranes are normal.  Eyes: Conjunctivae are normal.  Cardiovascular: Normal rate and regular rhythm.  No murmur heard. Respiratory: Effort normal and breath sounds normal. No respiratory distress.  GI: Soft. Bowel sounds are normal. He exhibits no mass. There is no abdominal tenderness.  Musculoskeletal:        General: No edema.  Lymphadenopathy:    He has no cervical adenopathy.  Neurological: He is alert and oriented to person, place, and time. He has normal strength. Gait normal.  Skin: Skin is warm. No rash noted. No erythema.  Psychiatric: His mood appears anxious.  Well groomed,good eye contact.    ASSESSMENT AND PLAN:  [name] was seen today for fatigue and generalized body aches.  Diagnoses and all orders for this visit:  Lab Results  Component Value Date   WBC 7.0 09/04/2018   HGB 15.9 09/04/2018   HCT 47.3 09/04/2018   MCV 85.1 09/04/2018   PLT 199.0 09/04/2018   Lab Results  Component Value Date   CREATININE 0.82 09/04/2018   BUN 15 09/04/2018   NA 138 09/04/2018   K 4.3 09/04/2018   CL 102 09/04/2018   CO2 28 09/04/2018   Lab Results  Component Value Date   PSA 0.38 09/04/2018    Generalized body aches And fatigue.  We discussed possible causes,most likely related with viral  illness. Monitor for new symptoms. Rapid flu test negative. Further recommendations will be given with lab results.  -     POC Influenza A/B -     Basic metabolic panel -     CBC with Differential/Platelet  Incontinence of feces, unspecified fecal incontinence type We discussed possible etiologies. ? Constipation. ? IBS. Colonoscopy and pathology report reviewed with pt. Recommend Metamucil and adequate hydration. F/U with PCP as needed.  Urinary frequency ? BPH. He has not had prostate cancer screening done,he agrees with having it today. Instructed about warning signs.  -     Urinalysis, Routine w reflex microscopic -     PSA    Return if symptoms worsen or fail to improve.     G. Martinique, MD  [facility]. [place] office.

## 2018-12-17 ENCOUNTER — Other Ambulatory Visit: Payer: Self-pay

## 2018-12-17 ENCOUNTER — Encounter: Payer: Self-pay | Admitting: Family Medicine

## 2018-12-17 ENCOUNTER — Ambulatory Visit (INDEPENDENT_AMBULATORY_CARE_PROVIDER_SITE_OTHER): Payer: BC Managed Care – PPO | Admitting: Family Medicine

## 2018-12-17 VITALS — BP 150/86 | HR 87 | Temp 98.1°F | Wt 277.5 lb

## 2018-12-17 DIAGNOSIS — K625 Hemorrhage of anus and rectum: Secondary | ICD-10-CM | POA: Diagnosis not present

## 2018-12-17 NOTE — Progress Notes (Signed)
   Subjective:    Patient ID: Shane [name], male    DOB: 1965-11-12, 53 y.o.   MRN: [id number]  HPI Here for 3 days of frequent bleeding from the rectum. There is no rectal pain. The blood is usually bright red but can be darker at times. His stools are soft and are not difficult to pass. He has been wearing tampons to slow the bleeding. No abdominal pain. He feels fine in general. He had a colonoscopy last October which showed internal hemorrhoids and precancerous polyps. No diverticuli.    Review of Systems  Constitutional: Negative.   Respiratory: Negative.   Cardiovascular: Negative.   Gastrointestinal: Positive for anal bleeding. Negative for abdominal distention, abdominal pain, blood in stool, constipation, diarrhea, nausea, rectal pain and vomiting.  Genitourinary: Negative.        Objective:   Physical Exam Constitutional:      General: He is not in acute distress.    Appearance: Normal appearance.  Cardiovascular:     Rate and Rhythm: Normal rate and regular rhythm.     Pulses: Normal pulses.     Heart sounds: Normal heart sounds.  Pulmonary:     Effort: Pulmonary effort is normal.     Breath sounds: Normal breath sounds.  Abdominal:     General: Abdomen is flat. Bowel sounds are normal. There is no distension.     Palpations: Abdomen is soft. There is no mass.     Tenderness: There is no abdominal tenderness. There is no guarding or rebound.     Hernia: No hernia is present.  Genitourinary:    Prostate: Normal.     Comments: Rectal exam reveals no tenderness, no masses, obvious blood on the glove  Neurological:     Mental Status: He is alert.           Assessment & Plan:  Rectal bleeding,likely from internal hemorrhoids. We will refer him to GI for a possible banding procedure.  Alysia Penna, MD

## 2019-01-02 ENCOUNTER — Telehealth: Payer: Self-pay | Admitting: *Deleted

## 2019-01-02 ENCOUNTER — Ambulatory Visit (INDEPENDENT_AMBULATORY_CARE_PROVIDER_SITE_OTHER): Payer: BC Managed Care – PPO | Admitting: Gastroenterology

## 2019-01-02 VITALS — BP 136/77 | HR 82 | Temp 97.7°F | Ht 71.0 in | Wt 278.4 lb

## 2019-01-02 DIAGNOSIS — K602 Anal fissure, unspecified: Secondary | ICD-10-CM

## 2019-01-02 MED ORDER — AMBULATORY NON FORMULARY MEDICATION: 1 refills | Status: DC

## 2019-01-02 NOTE — Progress Notes (Signed)
Shane [name]    [id number]    April 16, 1966  Primary Care Physician:Kwiatkowski, Doretha Sou, MD  Referring Physician: Laurey Morale, MD [place],  [place] [address]   Chief complaint: Rectal bleeding  HPI: 53 year old male with complaints of intermittent rectal bleeding and discomfort in the past few weeks.  Symptoms worse after he had an episode of severe constipation, was straining excessively during defecation.  Bowel habits somewhat better but he continues to have pain during bowel movement.  Colonoscopy May 10, 2018 4 sessile polyps [tubular adenoma and sessile serrated adenoma] removed, internal hemorrhoids   Outpatient Encounter Medications as of 01/02/2019  Medication Sig  . AMBULATORY NON FORMULARY MEDICATION Medication Name: Nitroglycerin Ointment 0.125% four times daily use pea sized amount per rectum   No facility-administered encounter medications on file as of 01/02/2019.     Allergies as of 01/02/2019 - Review Complete 12/17/2018  Allergen Reaction Noted  . Penicillins cross reactors Rash 06/24/2011    Past Medical History:  Diagnosis Date  . Chronic knee pain   . GERD (gastroesophageal reflux disease)   . Left shoulder pain   . Obesity     Past Surgical History:  Procedure Laterality Date  . carpal pummel repair    . [place] SURGERY  2003   per Dr. Sherwood Gambler   . COLONOSCOPY  05/10/2018   per Dr. Silverio Decamp, internal hemorrhoids and sessile serrated polyps, repeat in 3 yrs   . KNEE SURGERY Right     Family History  Problem Relation Age of Onset  . Heart disease Mother        mitrial valve repair, CABG   . Prostate cancer Father   . Hypertension Father   . Colon cancer Father   . Esophageal cancer Neg Hx   . Rectal cancer Neg Hx   . Stomach cancer Neg Hx     Social History   Socioeconomic History  . Marital status: Married    Spouse name: Not on file  . Number of children: Not on file  . Years of  education: Not on file  . Highest education level: Not on file  Occupational History  . Not on file  Social Needs  . Financial resource strain: Not on file  . Food insecurity    Worry: Not on file    Inability: Not on file  . Transportation needs    Medical: Not on file    Non-medical: Not on file  Tobacco Use  . Smoking status: Never Smoker  . Smokeless tobacco: Never Used  Substance and Sexual Activity  . Alcohol use: No    Alcohol/week: 0.0 standard drinks  . Drug use: No  . Sexual activity: Not on file  Lifestyle  . Physical activity    Days per week: Not on file    Minutes per session: Not on file  . Stress: Not on file  Relationships  . Social Herbalist on phone: Not on file    Gets together: Not on file    Attends religious service: Not on file    Active member of club or organization: Not on file    Attends meetings of clubs or organizations: Not on file    Relationship status: Not on file  . Intimate partner violence    Fear of current or ex partner: Not on file    Emotionally abused: Not on file  Physically abused: Not on file    Forced sexual activity: Not on file  Other Topics Concern  . Not on file  Social History Narrative  . Not on file      Review of systems: Review of Systems  Constitutional: Negative for fever and chills.  HENT: Negative.   Eyes: Negative for blurred vision.  Respiratory: Negative for cough, shortness of breath and wheezing.   Cardiovascular: Negative for chest pain and palpitations.  Gastrointestinal: as per HPI Genitourinary: Negative for dysuria, urgency, frequency and hematuria.  Musculoskeletal: Negative for myalgias, back pain and joint pain.  Skin: Negative for itching and rash.  Neurological: Negative for dizziness, tremors, focal weakness, seizures and loss of consciousness.  Endo/Heme/Allergies: Positive for seasonal allergies.  Psychiatric/Behavioral: Negative for depression, suicidal ideas and  hallucinations.  All other systems reviewed and are negative.   Physical Exam: Vitals:   01/02/19 1540  BP: 136/77  Pulse: 82  Temp: 97.7 F (36.5 C)  SpO2: 98%   Body mass index is 38.83 kg/m. Gen:      No acute distress HEENT:  EOMI, sclera anicteric Neck:     No masses; no thyromegaly Lungs:    Clear to auscultation bilaterally; normal respiratory effort CV:         Regular rate and rhythm; no murmurs Abd:      + bowel sounds; soft, non-tender; no palpable masses, no distension Ext:    No edema; adequate peripheral perfusion Skin:      Warm and dry; no rash Neuro: alert and oriented x 3 Psych: normal mood and affect Rectal exam: increased anal sphincter tone, + anal fissure  Anoscopy: Grade 2 internal hemorrhoids, anterior anal fissure, no active bleeding, normal dentate line, no visible nodules  Data Reviewed:  Reviewed labs, radiology imaging, old records and pertinent past GI work up   Assessment and Plan/Recommendations:  53 year old male with rectal discomfort and bright red blood per rectum secondary to anal fissure Start nitroglycerin 0.125% per rectum, small pea-sized amount 3-4 times daily for 6 to 8 weeks Benefiber 1 tablespoon twice daily Increase water intake to 8 to 10 cups daily Follow-up in  6-8 weeks  25 minutes was spent face-to-face with the patient. Greater than 50% of the time used for counseling as well as treatment plan and follow-up. He had multiple questions which were answered to his satisfaction  K. Denzil Magnuson , MD    CC: Laurey Morale, MD

## 2019-01-02 NOTE — Telephone Encounter (Signed)
Covid-19 screening questions   Do you now or have you had a fever in the last 14 days? No  Do you have any respiratory symptoms of shortness of breath or cough now or in the last 14 days? No  Do you have any family members or close contacts with diagnosed or suspected Covid-19 in the past 14 days? No  Have you been tested for Covid-19 and found to be positive? No        

## 2019-01-02 NOTE — Patient Instructions (Addendum)
We have sent a prescription for nitroglycerin 0.125% gel to [facility]. You should apply a pea size amount to your rectum four times daily x 6-8 weeks.  [facility]'s information is below: Address: [name], [place], [place] [address]  [phone]  *Please DO NOT go directly from our office to pick up this medication! Give the pharmacy 1 day to process the prescription as this is compounded at takes time to make.  Take Benefiber 1 tbs twice daily with juice or water   Anal Fissure, Adult  An anal fissure is a small tear or crack in the tissue around the opening of the butt (anus). Bleeding from the tear or crack usually stops on its own within a few minutes. The bleeding may happen every time you poop (have a bowel movement) until the tear or crack heals. What are the causes? This condition is usually caused by passing a large or hard poop (stool). Other causes include:  Trouble pooping (constipation).  Passing watery poop (diarrhea).  Inflammatory bowel disease (Crohn's disease or ulcerative colitis).  Childbirth.  Infections.  Anal sex. What are the signs or symptoms? Symptoms of this condition include:  Bleeding from the butt.  Small amounts of blood on your poop. The blood coats the outside of the poop. It is not mixed with the poop.  Small amounts of blood on the toilet paper or in the toilet after you poop.  Pain when passing poop.  Itching or irritation around the opening of the butt. How is this diagnosed? This condition may be diagnosed based on a physical exam. Your doctor may:  Check your butt. A tear can often be seen by checking the area with care.  Check your butt using a short tube (anoscope). The light in the tube will show any problems in your butt. How is this treated? Treatment for this condition may include:  Treating problems that make it hard for you to pass poop. You may be told to: ? Eat more fiber. ? Drink  more fluid. ? Take fiber supplements. ? Take medicines that make poop soft.  Taking sitz baths. This may help to heal the tear.  Using creams and ointments. If your condition gets worse, other treatments may be needed such as:  A shot near the tear or crack (botulinum injection).  Surgery to repair the tear or crack. Follow these instructions at home: Eating and drinking   Avoid bananas and dairy products. These foods can make it hard to poop.  Drink enough fluid to keep your pee (urine) pale yellow.  Eat foods that have a lot of fiber in them, such as: ? Beans. ? Whole grains. ? Fresh fruits. ? Fresh vegetables. General instructions   Take over-the-counter and prescription medicines only as told by your doctor.  Use creams or ointments only as told by your doctor.  Keep the butt area as clean and dry as you can.  Take a warm water bath (sitz bath) as told by your doctor. Do not use soap.  Keep all follow-up visits as told by your doctor. This is important. Contact a doctor if:  You have more bleeding.  You have a fever.  You have watery poop that is mixed with blood.  You have pain.  Your problem gets worse, not better. Summary  An anal fissure is a small tear or crack in the skin around the opening of the butt (anus).  This condition is usually caused by passing  a large or hard poop (stool).  Treatment includes treating the problems that make it hard for you to pass poop.  Follow your doctor's instructions about caring for your condition at home.  Keep all follow-up visits as told by your doctor. This is important. This information is not intended to replace advice given to you by your health care provider. Make sure you discuss any questions you have with your health care provider. Document Released: 02/23/2011 Document Revised: 12/07/2017 Document Reviewed: 12/07/2017 Elsevier Interactive Patient Education  2019 [employer].  I appreciate the   opportunity to care for you  Thank You   Harl Bowie , MD

## 2019-01-08 ENCOUNTER — Encounter: Payer: Self-pay | Admitting: Gastroenterology

## 2019-01-21 ENCOUNTER — Telehealth: Payer: Self-pay | Admitting: *Deleted

## 2019-01-21 NOTE — Telephone Encounter (Signed)
All answer are no

## 2019-01-21 NOTE — Telephone Encounter (Signed)
Covid-19 screening questions   Do you now or have you had a fever in the last 14 days?  Do you have any respiratory symptoms of shortness of breath or cough now or in the last 14 days?  Do you have any family members or close contacts with diagnosed or suspected Covid-19 in the past 14 days?  Have you been tested for Covid-19 and found to be positive?       

## 2019-01-22 ENCOUNTER — Ambulatory Visit: Payer: BC Managed Care – PPO | Admitting: Gastroenterology

## 2019-01-22 ENCOUNTER — Other Ambulatory Visit: Payer: Self-pay

## 2019-01-22 ENCOUNTER — Encounter: Payer: Self-pay | Admitting: Gastroenterology

## 2019-01-22 VITALS — BP 144/82 | HR 76 | Ht 71.0 in | Wt 278.0 lb

## 2019-01-22 DIAGNOSIS — R159 Full incontinence of feces: Secondary | ICD-10-CM

## 2019-01-22 DIAGNOSIS — K625 Hemorrhage of anus and rectum: Secondary | ICD-10-CM | POA: Diagnosis not present

## 2019-01-22 DIAGNOSIS — K602 Anal fissure, unspecified: Secondary | ICD-10-CM | POA: Diagnosis not present

## 2019-01-22 MED ORDER — AMBULATORY NON FORMULARY MEDICATION: 2 refills | Status: DC

## 2019-01-22 NOTE — Patient Instructions (Addendum)
We will refill your Nitroglycerin today to use three times daily for 6-8 weeks  Use Fiber Choice tablets 1 tablet with lunch at work  Continue Benefiber 1 tablespoon twice daily with juice or water  I appreciate the  opportunity to care for you  Thank You   Harl Bowie , MD

## 2019-01-22 NOTE — Progress Notes (Signed)
Shane [name]    [id number]    1965-09-04  Primary Care Physician:Kwiatkowski, Doretha Sou, MD  Referring Physician: Marletta Lor, MD [place],  [place] [address]   Chief complaint: Anal fissure  HPI:  53 year old male with history of anal fissure here for follow-up visit. Anal discomfort and rectal bleeding is improving.  Continues to have difficulty evacuating completely during defecation and he has seepage occasionally after intercourse or walking.   Colonoscopy May 10, 2018 4 sessile polyps [tubular adenoma and sessile serrated adenoma] removed, internal hemorrhoids  Outpatient Encounter Medications as of 01/22/2019  Medication Sig  . AMBULATORY NON FORMULARY MEDICATION Medication Name: Nitroglycerin Ointment 0.125% four times daily use pea sized amount per rectum   No facility-administered encounter medications on file as of 01/22/2019.     Allergies as of 01/22/2019 - Review Complete 01/22/2019  Allergen Reaction Noted  . Penicillins cross reactors Rash 06/24/2011    Past Medical History:  Diagnosis Date  . Chronic knee pain   . GERD (gastroesophageal reflux disease)   . Left shoulder pain   . Obesity     Past Surgical History:  Procedure Laterality Date  . carpal pummel repair Bilateral   . CERVICAL DISC SURGERY  2003   per Dr. Sherwood Gambler   . COLONOSCOPY  05/10/2018   per Dr. Silverio Decamp, internal hemorrhoids and sessile serrated polyps, repeat in 3 yrs   . KNEE SURGERY Right     Family History  Problem Relation Age of Onset  . Heart disease Mother        mitrial valve repair, CABG   . Prostate cancer Father   . Hypertension Father   . Colon cancer Father        in his early 59's  . Esophageal cancer Neg Hx   . Rectal cancer Neg Hx   . Stomach cancer Neg Hx     Social History   Socioeconomic History  . Marital status: Married    Spouse name: Not on file  . Number of children: 3  . Years of education:  Not on file  . Highest education level: Not on file  Occupational History  . Occupation: [employer]   Social Needs  . Financial resource strain: Not on file  . Food insecurity    Worry: Not on file    Inability: Not on file  . Transportation needs    Medical: Not on file    Non-medical: Not on file  Tobacco Use  . Smoking status: Never Smoker  . Smokeless tobacco: Never Used  Substance and Sexual Activity  . Alcohol use: No    Alcohol/week: 0.0 standard drinks  . Drug use: No  . Sexual activity: Not on file  Lifestyle  . Physical activity    Days per week: Not on file    Minutes per session: Not on file  . Stress: Not on file  Relationships  . Social Herbalist on phone: Not on file    Gets together: Not on file    Attends religious service: Not on file    Active member of club or organization: Not on file    Attends meetings of clubs or organizations: Not on file    Relationship status: Not on file  . Intimate partner violence    Fear of current or ex partner: Not on file    Emotionally abused: Not on file  Physically abused: Not on file    Forced sexual activity: Not on file  Other Topics Concern  . Not on file  Social History Narrative  . Not on file      Review of systems: Review of Systems  Constitutional: Negative for fever and chills.  HENT: Negative.   Eyes: Negative for blurred vision.  Respiratory: Negative for cough, shortness of breath and wheezing.   Cardiovascular: Negative for chest pain and palpitations.  Gastrointestinal: as per HPI Genitourinary: Negative for dysuria, urgency, frequency and hematuria.  Musculoskeletal: Negative for myalgias, back pain and joint pain.  Skin: Negative for itching and rash.  Neurological: Negative for dizziness, tremors, focal weakness, seizures and loss of consciousness.  Endo/Heme/Allergies: Positive for seasonal allergies.  Psychiatric/Behavioral: Negative for depression, suicidal ideas and  hallucinations.  All other systems reviewed and are negative.   Physical Exam: Vitals:   01/22/19 0921  BP: (!) 144/82  Pulse: 76   Body mass index is 38.77 kg/m. Gen:      No acute distress HEENT:  EOMI, sclera anicteric Neck:     No masses; no thyromegaly Lungs:    Clear to auscultation bilaterally; normal respiratory effort CV:         Regular rate and rhythm; no murmurs Abd:      + bowel sounds; soft, non-tender; no palpable masses, no distension Ext:    No edema; adequate peripheral perfusion Skin:      Warm and dry; no rash Neuro: alert and oriented x 3 Psych: normal mood and affect Rectal exam: Increased anal sphincter tone with mild tenderness, no  external hemorrhoids, Anal fissure + Anoscopy: Healing anal fissure, grade 1-2 internal hemorrhoids   Data Reviewed:  Reviewed labs, radiology imaging, old records and pertinent past GI work up   Assessment and Plan/Recommendations:  53 year old male with anal fissure, healing  Continue 0.125% nitroglycerin per rectum, small pea-sized amount 2-3 times daily for additional 6 to 8 weeks  Use Fiber Choice tablets 1 tablet with lunch at work, Theme park manager 1 tablespoon twice daily with breakfast and dinner  Fecal seepage secondary to incomplete evacuation/outlet dysfunction.  If continues to have persistent symptoms will consider anorectal manometry to evaluate for dyssynergy defecation and refer to pelvic floor PT  Follow-up in 2 to 3 months  15 minutes was spent face-to-face with the patient. Greater than 50% of the time used for counseling as well as treatment plan and follow-up. He had multiple questions which were answered to his satisfaction  K. Denzil Magnuson , MD    CC: Marletta Lor, MD

## 2019-01-29 ENCOUNTER — Encounter: Payer: Self-pay | Admitting: Gastroenterology

## 2019-03-08 ENCOUNTER — Encounter: Payer: Self-pay | Admitting: Gastroenterology

## 2019-03-08 ENCOUNTER — Ambulatory Visit: Payer: BC Managed Care – PPO | Admitting: Family Medicine

## 2019-03-08 ENCOUNTER — Ambulatory Visit: Payer: BC Managed Care – PPO | Admitting: Gastroenterology

## 2019-03-08 ENCOUNTER — Encounter: Payer: Self-pay | Admitting: Family Medicine

## 2019-03-08 VITALS — BP 140/64 | HR 77 | Temp 98.3°F | Wt 293.0 lb

## 2019-03-08 VITALS — BP 132/88 | HR 55 | Temp 97.6°F | Ht 71.0 in | Wt 280.2 lb

## 2019-03-08 DIAGNOSIS — K5902 Outlet dysfunction constipation: Secondary | ICD-10-CM

## 2019-03-08 DIAGNOSIS — R151 Fecal smearing: Secondary | ICD-10-CM | POA: Diagnosis not present

## 2019-03-08 DIAGNOSIS — T63301A Toxic effect of unspecified spider venom, accidental (unintentional), initial encounter: Secondary | ICD-10-CM | POA: Diagnosis not present

## 2019-03-08 DIAGNOSIS — K602 Anal fissure, unspecified: Secondary | ICD-10-CM | POA: Diagnosis not present

## 2019-03-08 DIAGNOSIS — N5089 Other specified disorders of the male genital organs: Secondary | ICD-10-CM | POA: Diagnosis not present

## 2019-03-08 MED ORDER — DOXYCYCLINE HYCLATE 100 MG PO CAPS: 100.0000 mg | ORAL_CAPSULE | Freq: Two times a day (BID) | ORAL | 0 refills | Status: AC

## 2019-03-08 MED ORDER — AMBULATORY NON FORMULARY MEDICATION: 2 refills | Status: DC

## 2019-03-08 NOTE — Progress Notes (Signed)
Shane [name]    [id number]    10-May-1966  Primary Care Physician:[name], Shane Sou, MD  Referring Physician: Marletta Lor, MD [name],  [place] [address]   Chief complaint: Anal fissure HPI:  53 year old male with history of anal fissure for follow-up visit.  Anal discomfort improving.  No longer has rectal bleeding but has occasional mucus discharge.  He is having soft bowel movement, denies excessive straining.  Is taking Fiberchoice 1 tablet 2-3 times daily.  Is using nitroglycerin per rectum twice daily.  Denies any nausea, vomiting, abdominal pain, melena or bright red blood per rectum  Colonoscopy October31, 2019 4 sessile polyps [tubular adenoma and sessile serrated adenoma] removed, internal hemorrhoids  Outpatient Encounter Medications as of 03/08/2019  Medication Sig  . AMBULATORY NON FORMULARY MEDICATION Medication Name: Nitroglycerin Ointment 0.125% three times daily use pea sized amount per rectum   No facility-administered encounter medications on file as of 03/08/2019.     Allergies as of 03/08/2019 - Review Complete 03/08/2019  Allergen Reaction Noted  . Penicillins cross reactors Rash 06/24/2011    Past Medical History:  Diagnosis Date  . Chronic knee pain   . GERD (gastroesophageal reflux disease)   . Left shoulder pain   . Obesity     Past Surgical History:  Procedure Laterality Date  . carpal pummel repair Bilateral   . CERVICAL DISC SURGERY  2003   per Dr. Sherwood [name]   . COLONOSCOPY  05/10/2018   per Dr. Silverio [name], internal hemorrhoids and sessile serrated polyps, repeat in 3 yrs   . KNEE SURGERY Right     Family History  Problem Relation Age of Onset  . Heart disease Mother        mitrial valve repair, CABG   . Prostate cancer Father   . Hypertension Father   . Colon cancer Father        in his early 22's  . Esophageal cancer Neg Hx   . Rectal cancer Neg Hx   . Stomach cancer Neg Hx      Social History   Socioeconomic History  . Marital status: Married    Spouse name: Not on file  . Number of children: 3  . Years of education: Not on file  . Highest education level: Not on file  Occupational History  . Occupation: Shane [name]   Social Needs  . Financial resource strain: Not on file  . Food insecurity    Worry: Not on file    Inability: Not on file  . Transportation needs    Medical: Not on file    Non-medical: Not on file  Tobacco Use  . Smoking status: Never Smoker  . Smokeless tobacco: Never Used  Substance and Sexual Activity  . Alcohol use: No    Alcohol/week: 0.0 standard drinks  . Drug use: No  . Sexual activity: Not on file  Lifestyle  . Physical activity    Days per week: Not on file    Minutes per session: Not on file  . Stress: Not on file  Relationships  . Social Herbalist on phone: Not on file    Gets together: Not on file    Attends religious service: Not on file    Active member of club or organization: Not on file    Attends meetings of clubs or organizations: Not on file    Relationship status: Not on file  .  Intimate partner violence    Fear of current or ex partner: Not on file    Emotionally abused: Not on file    Physically abused: Not on file    Forced sexual activity: Not on file  Other Topics Concern  . Not on file  Social History Narrative  . Not on file      Review of systems: Review of Systems  Constitutional: Negative for fever and chills.  HENT: Negative.   Eyes: Negative for blurred vision.  Respiratory: Negative for cough, shortness of breath and wheezing.   Cardiovascular: Negative for chest pain and palpitations.  Gastrointestinal: as per HPI Genitourinary: Negative for dysuria, urgency, frequency and hematuria.  Musculoskeletal: Negative for myalgias, back pain and joint pain.  Skin: Negative for itching and rash.  Neurological: Negative for dizziness, tremors, focal weakness, seizures  and loss of consciousness.  Endo/Heme/Allergies: Negative Psychiatric/Behavioral: Negative for depression, suicidal ideas and hallucinations.  All other systems reviewed and are negative.   Physical Exam: Vitals:   03/08/19 1101  BP: 132/88  Pulse: (!) 55  Temp: 97.6 F (36.4 C)   Body mass index is 39.09 kg/m. Gen:      No acute distress HEENT:  EOMI, sclera anicteric Neck:     No masses; no thyromegaly Lungs:    Clear to auscultation bilaterally; normal respiratory effort CV:         Regular rate and rhythm; no murmurs Abd:      + bowel sounds; soft, non-tender; no palpable masses, no distension Ext:    No edema; adequate peripheral perfusion Skin:      Warm and dry; no rash Neuro: alert and oriented x 3 Psych: normal mood and affect Rectal exam: Normal anal sphincter tone, no external hemorrhoids.  Positive small anterior anal  Data Reviewed:  Reviewed labs, radiology imaging, old records and pertinent past GI work up   Assessment and Plan/Recommendations:  53 year old male with constipation outlet dysfunction, fecal seepage, and small anterior anal fissure healing  Continue Fiberchoice 1 tablet 2-3 times daily with meals Increase dietary fiber and fluid intake  Continue 0.125% nitroglycerin per rectum, small pea-sized amount twice daily for additional 4 to 6 weeks  Due for recall colonoscopy October 2022  Follow-up as needed  15 minutes was spent face-to-face with the patient. Greater than 50% of the time used for counseling as well as treatment plan and follow-up.   Shane [name] , MD    CC: Shane Lor, MD

## 2019-03-08 NOTE — Progress Notes (Signed)
   Subjective:    Patient ID: Shane [name], male    DOB: [birth year], 53 y.o.   MRN: [id number]  HPI Here for 2 issues. First he has noticed a painless swelling in the left side of the scrotum for the past 4 months. Sometimes the left testicle seems to be pulling up into the inguinal canal. Sometimes he feels like his ejaculations are weaker than normal during sex, but these are not painful. No hx of trauma. He had a scrotal US in 2004 which showed small bilateral hydroceles. The second problem started 2 weeks ago when he noticed a tender red spot on the right lower leg above the ankle. No hx of trauma. Sometimes this drains some clear fluid, but it does not bleed. Over time a small "hole" has opened up at the site. He is cleaning this with peroxide daily. He feels fine in general, no fevers or body aches.    Review of Systems  Constitutional: Negative.   Respiratory: Negative.   Cardiovascular: Negative.   Gastrointestinal: Negative.   Genitourinary: Positive for scrotal swelling.  Skin: Positive for wound.  Neurological: Negative.        Objective:   Physical Exam Constitutional:      Appearance: Normal appearance.  Cardiovascular:     Rate and Rhythm: Normal rate and regular rhythm.     Pulses: Normal pulses.     Heart sounds: Normal heart sounds.  Pulmonary:     Effort: Pulmonary effort is normal.     Breath sounds: Normal breath sounds.  Genitourinary:    Comments: There are cystic non-tender lesions on both sides of the scrotum at the superior poles of the testicles. The left sided lesion is much larger than the right. No hernias are noted  Skin:    Comments: The right lower lateral leg has a small ulcerated lesion surrounded by a zone of erythema. This is slightly tender   Neurological:     Mental Status: He is alert.           Assessment & Plan:  The scrotal masses are likely hydroceles. To evaluate further, we will set up a scrotal US next week. The right leg  lesion is likely the result of a brown recluse spider bite. We will cover this with Doxycycline.  Alysia Penna, MD

## 2019-03-08 NOTE — Patient Instructions (Signed)
Continue Fiber choice  Continue Nitroglycerin twice daily  I appreciate the  opportunity to care for you  Thank You   Harl Bowie , MD

## 2019-10-01 ENCOUNTER — Telehealth: Payer: Self-pay | Admitting: *Deleted

## 2019-10-01 NOTE — Telephone Encounter (Signed)
Spoke with patient and an appointment scheduled for 10/01/19

## 2019-10-01 NOTE — Telephone Encounter (Signed)
Patient called triage line and caller states that he has has diarrhea for 2 days. 15-20 episodes in last 24 hrs. No blood or black/tarry. No fever or vomiting. Patient was advised to: SEE PCP WITHIN 24 HOURS: * IF OFFICE WILL BE OPEN: You need to be seen within the next 24 hours. Call your doctor (or NP/PA) when the office opens and make an appointment. FLUID THERAPY DURING SEVERE DIARRHEA: * Drink more fluids, at least 8-10 cups daily. One cup equals 8 oz (240 ml). * WATER: Even for severe diarrhea, water is often the best liquid to drink. You should also eat some salty foods (e.g., potato chips, pretzels, saltine crackers). This is important to make sure you are getting enough salt, sugars, and fluids to meet your body's needs. * SPORTS DRINKS: You can also drink a sports drink (e.g., Gatorade, Powerade) to help treat and prevent dehydration. For it to work best, mix it half and half with water. * Avoid caffeinated beverages (Reason: caffeine is mildly dehydrating). * Avoid alcohol beverages (beer, wine, hard liquor). FOOD AND NUTRITION DURING SEVERE DIARRHEA: * Drinking enough liquids is more important that eating when one has severe diarrhea. * As the diarrhea starts to get better, you can slowly return to a normal diet. * Begin with boiled starches / cereals (e.g., potatoes, rice, noodles, wheat, oats) with a small amount of salt to taste. * Other foods that are OK include: bananas, yogurt, crackers, soup. CONTAGIOUSNESS: * Wash your hands after using the bathroom. CALL BACK IF: * Signs of dehydration occur (e.g., no urine over 12 hours, very dry mouth, lightheaded, etc.) * Bloody stools * Constant or severe abdominal pain * You become worse. CARE ADVICE given per Diarrhea (Adult) guideline.

## 2019-10-01 NOTE — Telephone Encounter (Signed)
Spoke with patient and he stated that he was told this morning to go to urgent care for his diarrhea. Patient stated that he hasn't gone yet because he need to come up with the $75 copay and then he will be able to go this afternoon.

## 2019-10-02 ENCOUNTER — Other Ambulatory Visit: Payer: Self-pay

## 2019-10-02 ENCOUNTER — Telehealth (INDEPENDENT_AMBULATORY_CARE_PROVIDER_SITE_OTHER): Payer: BC Managed Care – PPO | Admitting: Internal Medicine

## 2019-10-02 ENCOUNTER — Encounter: Payer: Self-pay | Admitting: Internal Medicine

## 2019-10-02 ENCOUNTER — Telehealth: Payer: Self-pay | Admitting: Internal Medicine

## 2019-10-02 VITALS — Ht 70.0 in | Wt 270.0 lb

## 2019-10-02 DIAGNOSIS — K529 Noninfective gastroenteritis and colitis, unspecified: Secondary | ICD-10-CM

## 2019-10-02 NOTE — Telephone Encounter (Signed)
Letter sent.

## 2019-10-02 NOTE — Progress Notes (Signed)
    Virtual Visit via Telephone Note  I connected with Shane [name] on 10/02/19 at  1:00 PM EDT by telephone and verified that I am speaking with the correct person using two identifiers.   I discussed the limitations, risks, security and privacy concerns of performing an evaluation and management service by telephone and the availability of in person appointments. I also discussed with the patient that there may be a patient responsible charge related to this service. The patient expressed understanding and agreed to proceed.  Location patient: home Location provider: work office Participants present for the call: patient, provider Patient did not have a visit in the prior 7 days to address this/these issue(s).   History of Present Illness:   Shane [name] is a 54 y.o. male who is coming in today for the above mentioned reasons.Sunday am into Monday had profuse, watery diarrhea of about 8-10 episodes. By Tuesday had some consistency. Today has only had 1 episode. No cramping, no fever, no suspicious food that he can recall, no sick contacts, no recent abx exposure. No n/v.    Observations/Objective: Patient sounds cheerful and well on the phone. I do not appreciate any increased work of breathing. Speech and thought processing are grossly intact. Patient reported vitals: none reported  No current outpatient medications on file.  Review of Systems:  Constitutional: Denies fever, chills, diaphoresis, appetite change and fatigue.  HEENT: Denies photophobia, eye pain, redness, hearing loss, ear pain, congestion, sore throat, rhinorrhea, sneezing, mouth sores, trouble swallowing, neck pain, neck stiffness and tinnitus.   Respiratory: Denies SOB, DOE, cough, chest tightness,  and wheezing.   Cardiovascular: Denies chest pain, palpitations and leg swelling.  Gastrointestinal: Denies nausea, vomiting, abdominal pain,  constipation, blood in stool and abdominal distention.    Genitourinary: Denies dysuria, urgency, frequency, hematuria, flank pain and difficulty urinating.  Endocrine: Denies: hot or cold intolerance, sweats, changes in hair or nails, polyuria, polydipsia. Musculoskeletal: Denies myalgias, back pain, joint swelling, arthralgias and gait problem.  Skin: Denies pallor, rash and wound.  Neurological: Denies dizziness, seizures, syncope, weakness, light-headedness, numbness and headaches.  Hematological: Denies adenopathy. Easy bruising, personal or family bleeding history  Psychiatric/Behavioral: Denies suicidal ideation, mood changes, confusion, nervousness, sleep disturbance and agitation   Assessment and Plan:  Gastroenteritis -Suspect acute viral gastroenteritis that is already resolving. -Advised rest, fluids. -He is requesting a work note to return next week. He will advise [place] on the method he would prefer [place] to send it to as he does not have a MyChart account.   I discussed the assessment and treatment plan with the patient. The patient was provided an opportunity to ask questions and all were answered. The patient agreed with the plan and demonstrated an understanding of the instructions.   The patient was advised to call back or seek an in-person evaluation if the symptoms worsen or if the condition fails to improve as anticipated.  I provided 12 minutes of non-face-to-face time during this encounter.   Lelon Frohlich, MD  Primary Care at [facility]

## 2019-10-02 NOTE — Telephone Encounter (Signed)
Pt wife would like pt work note sent via mychart from visit today 10/02/19.

## 2019-12-20 ENCOUNTER — Encounter: Payer: BC Managed Care – PPO | Admitting: Internal Medicine

## 2020-01-03 ENCOUNTER — Encounter: Payer: BC Managed Care – PPO | Admitting: Internal Medicine

## 2020-01-31 ENCOUNTER — Other Ambulatory Visit: Payer: Self-pay

## 2020-01-31 ENCOUNTER — Encounter: Payer: Self-pay | Admitting: Internal Medicine

## 2020-01-31 ENCOUNTER — Ambulatory Visit (INDEPENDENT_AMBULATORY_CARE_PROVIDER_SITE_OTHER): Payer: BC Managed Care – PPO | Admitting: Internal Medicine

## 2020-01-31 VITALS — BP 110/80 | HR 77 | Temp 98.2°F | Ht 70.0 in | Wt 295.3 lb

## 2020-01-31 DIAGNOSIS — K219 Gastro-esophageal reflux disease without esophagitis: Secondary | ICD-10-CM

## 2020-01-31 DIAGNOSIS — I10 Essential (primary) hypertension: Secondary | ICD-10-CM | POA: Diagnosis not present

## 2020-01-31 DIAGNOSIS — Z125 Encounter for screening for malignant neoplasm of prostate: Secondary | ICD-10-CM

## 2020-01-31 DIAGNOSIS — Z23 Encounter for immunization: Secondary | ICD-10-CM

## 2020-01-31 NOTE — Progress Notes (Signed)
Established Patient Office Visit     This visit occurred during the SARS-CoV-2 public health emergency.  Safety protocols were in place, including screening questions prior to the visit, additional usage of staff PPE, and extensive cleaning of exam room while observing appropriate contact time as indicated for disinfecting solutions.    CC/Reason for Visit: Requesting lab work, follow-up chronic conditions  HPI: Shane [name] is a 54 y.o. male who is coming in today for the above mentioned reasons. Past Medical History is significant for: Morbid obesity, history of hypertension not on medications.  He has been complaining of some mild epigastric pain that happens only occasionally.  He is not on any medications.  He is requesting lab work.  He had a colonoscopy in 2019.  His father was  diagnosed with prostate cancer so he "wants to make sure that everything is all right".  He is due for shingles and Covid vaccines today.   Past Medical/Surgical History: Past Medical History:  Diagnosis Date  . Chronic knee pain   . GERD (gastroesophageal reflux disease)   . Left shoulder pain   . Obesity     Past Surgical History:  Procedure Laterality Date  . carpal pummel repair Bilateral   . CERVICAL DISC SURGERY  2003   per Dr. Sherwood Gambler   . COLONOSCOPY  05/10/2018   per Dr. Silverio Decamp, internal hemorrhoids and sessile serrated polyps, repeat in 3 yrs   . KNEE SURGERY Right     Social History:  reports that he has never smoked. He has never used smokeless tobacco. He reports that he does not drink alcohol and does not use drugs.  Allergies: Allergies  Allergen Reactions  . Penicillins Cross Reactors Rash    Family History:  Family History  Problem Relation Age of Onset  . Heart disease Mother        mitrial valve repair, CABG   . Prostate cancer Father   . Hypertension Father   . Colon cancer Father        in his early 14's  . Esophageal cancer Neg Hx   . Rectal cancer  Neg Hx   . Stomach cancer Neg Hx     No current outpatient medications on file.  Review of Systems:  Constitutional: Denies fever, chills, diaphoresis, appetite change and fatigue.  HEENT: Denies photophobia, eye pain, redness, hearing loss, ear pain, congestion, sore throat, rhinorrhea, sneezing, mouth sores, trouble swallowing, neck pain, neck stiffness and tinnitus.   Respiratory: Denies SOB, DOE, cough, chest tightness,  and wheezing.   Cardiovascular: Denies chest pain, palpitations and leg swelling.  Gastrointestinal: Denies nausea, vomiting, abdominal pain, diarrhea, constipation, blood in stool and abdominal distention.  Genitourinary: Denies dysuria, urgency, frequency, hematuria, flank pain and difficulty urinating.  Endocrine: Denies: hot or cold intolerance, sweats, changes in hair or nails, polyuria, polydipsia. Musculoskeletal: Denies myalgias, back pain, joint swelling, arthralgias and gait problem.  Skin: Denies pallor, rash and wound.  Neurological: Denies dizziness, seizures, syncope, weakness, light-headedness, numbness and headaches.  Hematological: Denies adenopathy. Easy bruising, personal or family bleeding history  Psychiatric/Behavioral: Denies suicidal ideation, mood changes, confusion, nervousness, sleep disturbance and agitation    Physical Exam: Vitals:   01/31/20 1054  BP: 110/80  Pulse: 77  Temp: 98.2 F (36.8 C)  TempSrc: Oral  SpO2: 97%  Weight: (!) 295 lb 4.8 oz (133.9 kg)  Height: 5\' 10"  (1.778 m)    Body mass index is 42.37 kg/m.   Constitutional: NAD,  calm, comfortable Eyes: PERRL, lids and conjunctivae normal ENMT: Mucous membranes are moist. Respiratory: clear to auscultation bilaterally, no wheezing, no crackles. Normal respiratory effort. No accessory muscle use.  Cardiovascular: Regular rate and rhythm, no murmurs / rubs / gallops. No extremity edema. Abdomen: no tenderness, no masses palpated. No hepatosplenomegaly. Bowel sounds  positive.  Musculoskeletal: no clubbing / cyanosis. No joint deformity upper and lower extremities. Good ROM, no contractures. Normal muscle tone.  Neurologic: Grossly intact and nonfocal. Psychiatric: Normal judgment and insight. Alert and oriented x 3. Normal mood.    Impression and Plan:  Essential hypertension  -Well-controlled, not currently on medications.  Morbid obesity ([place]) -Discussed healthy lifestyle, including increased physical activity and better food choices to promote weight loss.  Gastroesophageal reflux disease without esophagitis -I suspect this may be the cause of his epigastric pain, for now advised as needed PPI/H2 blockers, if pain becomes more consistent can consider daily therapy and avoidance of NSAIDs.  Prostate cancer screening  - Plan: PSA    Patient Instructions  -Nice seeing you today!!  -Lab work today; will notify you once results are available.  -First shingles vaccine today.  -Schedule follow up in 6 months.     Lelon Frohlich, MD [place] [facility]

## 2020-01-31 NOTE — Patient Instructions (Signed)
-  Nice seeing you today!!  -Lab work today; will notify you once results are available.  -First shingles vaccine today.  -Schedule follow up in 6 months.

## 2020-01-31 NOTE — Addendum Note (Signed)
Addended by: Westley Hummer B on: 01/31/2020 04:33 PM   Modules accepted: Orders

## 2020-02-01 LAB — LIPID PANEL
Cholesterol: 182 mg/dL (ref <200)
HDL: 40 mg/dL (ref >=40)
LDL Cholesterol (Calc): 120 mg/dL (calc) — ABNORMAL HIGH
Non-HDL Cholesterol (Calc): 142 mg/dL (calc) — ABNORMAL HIGH (ref <130)
Total CHOL/HDL Ratio: 4.6 (calc) (ref <5.0)
Triglycerides: 109 mg/dL (ref <150)

## 2020-02-01 LAB — PSA: PSA: 0.3 ng/mL (ref <=4.0)

## 2020-02-01 LAB — CBC WITH DIFFERENTIAL/PLATELET
Absolute Monocytes: 519 cells/uL (ref 200–950)
Basophils Absolute: 47 cells/uL (ref 0–200)
Basophils Relative: 0.8 %
Eosinophils Absolute: 59 cells/uL (ref 15–500)
Eosinophils Relative: 1 %
HCT: 45.1 % (ref 38.5–50.0)
Hemoglobin: 14.9 g/dL (ref 13.2–17.1)
Lymphs Abs: 2100 cells/uL (ref 850–3900)
MCH: 28.6 pg (ref 27.0–33.0)
MCHC: 33 g/dL (ref 32.0–36.0)
MCV: 86.6 fL (ref 80.0–100.0)
MPV: 10.1 fL (ref 7.5–12.5)
Monocytes Relative: 8.8 %
Neutro Abs: 3174 cells/uL (ref 1500–7800)
Neutrophils Relative %: 53.8 %
Platelets: 171 10*3/uL (ref 140–400)
RBC: 5.21 10*6/uL (ref 4.20–5.80)
RDW: 13.3 % (ref 11.0–15.0)
Total Lymphocyte: 35.6 %
WBC: 5.9 10*3/uL (ref 3.8–10.8)

## 2020-02-01 LAB — HEMOGLOBIN A1C
Hgb A1c MFr Bld: 5.3 % of total Hgb (ref <5.7)
Mean Plasma Glucose: 105 (calc)
eAG (mmol/L): 5.8 (calc)

## 2020-02-01 LAB — TSH: TSH: 1.27 mIU/L (ref 0.40–4.50)

## 2020-02-01 LAB — COMPREHENSIVE METABOLIC PANEL
AG Ratio: 1.6 (calc) (ref 1.0–2.5)
ALT: 25 U/L (ref 9–46)
AST: 22 U/L (ref 10–35)
Albumin: 4.1 g/dL (ref 3.6–5.1)
Alkaline phosphatase (APISO): 54 U/L (ref 35–144)
BUN: 13 mg/dL (ref 7–25)
CO2: 30 mmol/L (ref 20–32)
Calcium: 8.9 mg/dL (ref 8.6–10.3)
Chloride: 105 mmol/L (ref 98–110)
Creat: 0.89 mg/dL (ref 0.70–1.33)
Globulin: 2.5 g/dL (calc) (ref 1.9–3.7)
Glucose, Bld: 87 mg/dL (ref 65–99)
Potassium: 4 mmol/L (ref 3.5–5.3)
Sodium: 139 mmol/L (ref 135–146)
Total Bilirubin: 1.2 mg/dL (ref 0.2–1.2)
Total Protein: 6.6 g/dL (ref 6.1–8.1)

## 2020-02-01 LAB — VITAMIN D 25 HYDROXY (VIT D DEFICIENCY, FRACTURES): Vit D, 25-Hydroxy: 25 ng/mL — ABNORMAL LOW (ref 30–100)

## 2020-02-01 LAB — VITAMIN B12: Vitamin B-12: 392 pg/mL (ref 200–1100)

## 2020-02-04 ENCOUNTER — Other Ambulatory Visit: Payer: Self-pay | Admitting: Internal Medicine

## 2020-02-04 ENCOUNTER — Encounter: Payer: Self-pay | Admitting: Internal Medicine

## 2020-02-04 DIAGNOSIS — E559 Vitamin D deficiency, unspecified: Secondary | ICD-10-CM

## 2020-02-04 DIAGNOSIS — E785 Hyperlipidemia, unspecified: Secondary | ICD-10-CM | POA: Insufficient documentation

## 2020-02-04 MED ORDER — VITAMIN D (ERGOCALCIFEROL) 1.25 MG (50000 UNIT) PO CAPS: 50000.0000 [IU] | ORAL_CAPSULE | ORAL | 0 refills | Status: AC

## 2020-02-05 ENCOUNTER — Telehealth: Payer: Self-pay | Admitting: Internal Medicine

## 2020-02-05 ENCOUNTER — Other Ambulatory Visit: Payer: Self-pay | Admitting: Internal Medicine

## 2020-02-05 DIAGNOSIS — E559 Vitamin D deficiency, unspecified: Secondary | ICD-10-CM

## 2020-02-05 NOTE — Telephone Encounter (Signed)
Spoke with patient and reviewed lab results. 

## 2020-02-05 NOTE — Telephone Encounter (Signed)
Pt call and want you to call him back.

## 2020-02-15 ENCOUNTER — Encounter (HOSPITAL_COMMUNITY): Payer: Self-pay

## 2020-02-15 ENCOUNTER — Inpatient Hospital Stay (HOSPITAL_COMMUNITY)
Admission: EM | Admit: 2020-02-15 | Discharge: 2020-02-18 | DRG: 872 | Disposition: A | Discharge status: Home / Self Care | Payer: BC Managed Care – PPO | Attending: Internal Medicine | Admitting: Internal Medicine

## 2020-02-15 DIAGNOSIS — I10 Essential (primary) hypertension: Secondary | ICD-10-CM | POA: Diagnosis not present

## 2020-02-15 DIAGNOSIS — Z800 Family history of malignant neoplasm of digestive organs: Secondary | ICD-10-CM | POA: Diagnosis not present

## 2020-02-15 DIAGNOSIS — Z8249 Family history of ischemic heart disease and other diseases of the circulatory system: Secondary | ICD-10-CM | POA: Diagnosis not present

## 2020-02-15 DIAGNOSIS — I517 Cardiomegaly: Secondary | ICD-10-CM | POA: Diagnosis not present

## 2020-02-15 DIAGNOSIS — D696 Thrombocytopenia, unspecified: Secondary | ICD-10-CM | POA: Diagnosis not present

## 2020-02-15 DIAGNOSIS — L03116 Cellulitis of left lower limb: Secondary | ICD-10-CM | POA: Diagnosis present

## 2020-02-15 DIAGNOSIS — A419 Sepsis, unspecified organism: Secondary | ICD-10-CM | POA: Diagnosis not present

## 2020-02-15 DIAGNOSIS — M25512 Pain in left shoulder: Secondary | ICD-10-CM | POA: Diagnosis present

## 2020-02-15 DIAGNOSIS — R509 Fever, unspecified: Secondary | ICD-10-CM

## 2020-02-15 DIAGNOSIS — R55 Syncope and collapse: Secondary | ICD-10-CM | POA: Diagnosis present

## 2020-02-15 DIAGNOSIS — Z8042 Family history of malignant neoplasm of prostate: Secondary | ICD-10-CM | POA: Diagnosis not present

## 2020-02-15 DIAGNOSIS — Z20822 Contact with and (suspected) exposure to covid-19: Secondary | ICD-10-CM | POA: Diagnosis present

## 2020-02-15 DIAGNOSIS — E669 Obesity, unspecified: Secondary | ICD-10-CM | POA: Diagnosis present

## 2020-02-15 DIAGNOSIS — K219 Gastro-esophageal reflux disease without esophagitis: Secondary | ICD-10-CM | POA: Diagnosis not present

## 2020-02-15 DIAGNOSIS — R000 Tachycardia, unspecified: Secondary | ICD-10-CM | POA: Diagnosis not present

## 2020-02-15 DIAGNOSIS — R0602 Shortness of breath: Secondary | ICD-10-CM | POA: Diagnosis not present

## 2020-02-15 LAB — BASIC METABOLIC PANEL
Anion gap: 10 (ref 5–15)
BUN: 10 mg/dL (ref 6–20)
CO2: 26 mmol/L (ref 22–32)
Calcium: 9.3 mg/dL (ref 8.9–10.3)
Chloride: 103 mmol/L (ref 98–111)
Creatinine, Ser: 1.14 mg/dL (ref 0.61–1.24)
GFR calc Af Amer: >60 mL/min (ref >60)
GFR calc non Af Amer: >60 mL/min (ref >60)
Glucose, Bld: 106 mg/dL — ABNORMAL HIGH (ref 70–99)
Potassium: 4 mmol/L (ref 3.5–5.1)
Sodium: 139 mmol/L (ref 135–145)

## 2020-02-15 LAB — URINALYSIS, ROUTINE W REFLEX MICROSCOPIC
Bacteria, UA: NONE SEEN
Bilirubin Urine: NEGATIVE
Glucose, UA: NEGATIVE mg/dL
Ketones, ur: NEGATIVE mg/dL
Leukocytes,Ua: NEGATIVE
Nitrite: NEGATIVE
Protein, ur: NEGATIVE mg/dL
Specific Gravity, Urine: 1.019 (ref 1.005–1.030)
pH: 7 (ref 5.0–8.0)

## 2020-02-15 LAB — CBC
HCT: 47.9 % (ref 39.0–52.0)
Hemoglobin: 15 g/dL (ref 13.0–17.0)
MCH: 28.3 pg (ref 26.0–34.0)
MCHC: 31.3 g/dL (ref 30.0–36.0)
MCV: 90.4 fL (ref 80.0–100.0)
Platelets: 176 10*3/uL (ref 150–400)
RBC: 5.3 MIL/uL (ref 4.22–5.81)
RDW: 13.9 % (ref 11.5–15.5)
WBC: 15 10*3/uL — ABNORMAL HIGH (ref 4.0–10.5)
nRBC: 0 % (ref 0.0–0.2)

## 2020-02-15 MED ORDER — SODIUM CHLORIDE 0.9% FLUSH
3.0000 mL | Freq: Once | INTRAVENOUS | Status: AC
  Administered 2020-02-16: 3 mL via INTRAVENOUS

## 2020-02-15 MED ORDER — ONDANSETRON 4 MG PO TBDP
4.0000 mg | ORAL_TABLET | Freq: Once | ORAL | Status: AC
  Administered 2020-02-15: 4 mg via ORAL
  Filled 2020-02-15: qty 1

## 2020-02-15 NOTE — ED Notes (Signed)
Pt requesting something for nausea  

## 2020-02-15 NOTE — ED Notes (Signed)
Pt states that he is now having bilateral arm numbness and a headache, neuro intact bilaterally, no deficits noted

## 2020-02-15 NOTE — ED Triage Notes (Addendum)
Pt states that he had a near syncopal today while fishing, felt like he was going to vomit, episode did not last long, now having lower in lower back, denies urinary symptoms.

## 2020-02-16 ENCOUNTER — Other Ambulatory Visit: Payer: Self-pay

## 2020-02-16 ENCOUNTER — Emergency Department (HOSPITAL_COMMUNITY): Payer: BC Managed Care – PPO

## 2020-02-16 DIAGNOSIS — Z20822 Contact with and (suspected) exposure to covid-19: Secondary | ICD-10-CM | POA: Diagnosis present

## 2020-02-16 DIAGNOSIS — M25512 Pain in left shoulder: Secondary | ICD-10-CM | POA: Diagnosis present

## 2020-02-16 DIAGNOSIS — E669 Obesity, unspecified: Secondary | ICD-10-CM | POA: Diagnosis present

## 2020-02-16 DIAGNOSIS — Z800 Family history of malignant neoplasm of digestive organs: Secondary | ICD-10-CM | POA: Diagnosis not present

## 2020-02-16 DIAGNOSIS — K219 Gastro-esophageal reflux disease without esophagitis: Secondary | ICD-10-CM | POA: Diagnosis present

## 2020-02-16 DIAGNOSIS — A419 Sepsis, unspecified organism: Secondary | ICD-10-CM | POA: Diagnosis present

## 2020-02-16 DIAGNOSIS — L03116 Cellulitis of left lower limb: Secondary | ICD-10-CM | POA: Diagnosis present

## 2020-02-16 DIAGNOSIS — D696 Thrombocytopenia, unspecified: Secondary | ICD-10-CM | POA: Diagnosis present

## 2020-02-16 DIAGNOSIS — Z8042 Family history of malignant neoplasm of prostate: Secondary | ICD-10-CM | POA: Diagnosis not present

## 2020-02-16 DIAGNOSIS — R55 Syncope and collapse: Secondary | ICD-10-CM | POA: Diagnosis present

## 2020-02-16 DIAGNOSIS — R509 Fever, unspecified: Secondary | ICD-10-CM | POA: Diagnosis present

## 2020-02-16 DIAGNOSIS — I10 Essential (primary) hypertension: Secondary | ICD-10-CM

## 2020-02-16 DIAGNOSIS — Z8249 Family history of ischemic heart disease and other diseases of the circulatory system: Secondary | ICD-10-CM | POA: Diagnosis not present

## 2020-02-16 LAB — CBC
HCT: 40.3 % (ref 39.0–52.0)
Hemoglobin: 12.6 g/dL — ABNORMAL LOW (ref 13.0–17.0)
MCH: 28.3 pg (ref 26.0–34.0)
MCHC: 31.3 g/dL (ref 30.0–36.0)
MCV: 90.6 fL (ref 80.0–100.0)
Platelets: 126 10*3/uL — ABNORMAL LOW (ref 150–400)
RBC: 4.45 MIL/uL (ref 4.22–5.81)
RDW: 14.4 % (ref 11.5–15.5)
WBC: 12.3 10*3/uL — ABNORMAL HIGH (ref 4.0–10.5)
nRBC: 0 % (ref 0.0–0.2)

## 2020-02-16 LAB — CREATININE, SERUM
Creatinine, Ser: 1.2 mg/dL (ref 0.61–1.24)
GFR calc Af Amer: >60 mL/min (ref >60)
GFR calc non Af Amer: >60 mL/min (ref >60)

## 2020-02-16 LAB — HEPATIC FUNCTION PANEL
ALT: 21 U/L (ref 0–44)
AST: 23 U/L (ref 15–41)
Albumin: 3.4 g/dL — ABNORMAL LOW (ref 3.5–5.0)
Alkaline Phosphatase: 43 U/L (ref 38–126)
Bilirubin, Direct: 0.4 mg/dL — ABNORMAL HIGH (ref 0.0–0.2)
Indirect Bilirubin: 1.7 mg/dL — ABNORMAL HIGH (ref 0.3–0.9)
Total Bilirubin: 2.1 mg/dL — ABNORMAL HIGH (ref 0.3–1.2)
Total Protein: 6.7 g/dL (ref 6.5–8.1)

## 2020-02-16 LAB — TSH: TSH: 0.527 u[IU]/mL (ref 0.350–4.500)

## 2020-02-16 LAB — LIPASE, BLOOD: Lipase: 22 U/L (ref 11–51)

## 2020-02-16 LAB — SARS CORONAVIRUS 2 BY RT PCR (HOSPITAL ORDER, PERFORMED IN ~~LOC~~ HOSPITAL LAB): SARS Coronavirus 2: NEGATIVE

## 2020-02-16 LAB — POC SARS CORONAVIRUS 2 AG -  ED: SARS Coronavirus 2 Ag: NEGATIVE

## 2020-02-16 LAB — MAGNESIUM: Magnesium: 1.7 mg/dL (ref 1.7–2.4)

## 2020-02-16 LAB — LACTIC ACID, PLASMA
Lactic Acid, Venous: 2 mmol/L (ref 0.5–1.9)
Lactic Acid, Venous: 3.6 mmol/L (ref 0.5–1.9)

## 2020-02-16 LAB — HIV ANTIBODY (ROUTINE TESTING W REFLEX): HIV Screen 4th Generation wRfx: NONREACTIVE

## 2020-02-16 MED ORDER — HYDRALAZINE HCL 20 MG/ML IJ SOLN: 10.0000 mg | Freq: Four times a day (QID) | INTRAMUSCULAR | Status: DC | PRN

## 2020-02-16 MED ORDER — ONDANSETRON HCL 4 MG PO TABS: 4.0000 mg | ORAL_TABLET | Freq: Four times a day (QID) | ORAL | Status: DC | PRN

## 2020-02-16 MED ORDER — LACTATED RINGERS IV BOLUS
1000.0000 mL | Freq: Once | INTRAVENOUS | Status: AC
  Administered 2020-02-16: 1000 mL via INTRAVENOUS

## 2020-02-16 MED ORDER — KETOROLAC TROMETHAMINE 30 MG/ML IJ SOLN
30.0000 mg | Freq: Once | INTRAMUSCULAR | Status: DC
  Filled 2020-02-16: qty 1

## 2020-02-16 MED ORDER — METRONIDAZOLE IN NACL 5-0.79 MG/ML-% IV SOLN
500.0000 mg | Freq: Three times a day (TID) | INTRAVENOUS | Status: DC
  Administered 2020-02-16 – 2020-02-17 (×3): 500 mg via INTRAVENOUS
  Filled 2020-02-16 (×3): qty 100

## 2020-02-16 MED ORDER — VANCOMYCIN HCL 2000 MG/400ML IV SOLN
2000.0000 mg | Freq: Once | INTRAVENOUS | Status: AC
  Administered 2020-02-16: 2000 mg via INTRAVENOUS
  Filled 2020-02-16: qty 400

## 2020-02-16 MED ORDER — ENOXAPARIN SODIUM 40 MG/0.4ML ~~LOC~~ SOLN
40.0000 mg | SUBCUTANEOUS | Status: DC
  Administered 2020-02-16: 40 mg via SUBCUTANEOUS
  Filled 2020-02-16: qty 0.4

## 2020-02-16 MED ORDER — DOXYCYCLINE HYCLATE 100 MG PO CAPS
100.0000 mg | ORAL_CAPSULE | Freq: Two times a day (BID) | ORAL | 0 refills | Status: DC
Start: 2020-02-16 — End: 2020-02-18

## 2020-02-16 MED ORDER — VANCOMYCIN HCL IN DEXTROSE 1-5 GM/200ML-% IV SOLN
1000.0000 mg | Freq: Two times a day (BID) | INTRAVENOUS | Status: DC
  Administered 2020-02-17: 1000 mg via INTRAVENOUS
  Filled 2020-02-16: qty 200

## 2020-02-16 MED ORDER — ONDANSETRON HCL 4 MG/2ML IJ SOLN: 4.0000 mg | Freq: Four times a day (QID) | INTRAMUSCULAR | Status: DC | PRN

## 2020-02-16 MED ORDER — ACETAMINOPHEN 325 MG PO TABS
650.0000 mg | ORAL_TABLET | Freq: Four times a day (QID) | ORAL | Status: DC | PRN
  Administered 2020-02-16: 650 mg via ORAL
  Filled 2020-02-16: qty 2

## 2020-02-16 MED ORDER — VANCOMYCIN HCL IN DEXTROSE 1-5 GM/200ML-% IV SOLN: 1000.0000 mg | Freq: Once | INTRAVENOUS | Status: DC

## 2020-02-16 MED ORDER — SODIUM CHLORIDE 0.9 % IV SOLN: 1.0000 g | INTRAVENOUS | Status: DC

## 2020-02-16 MED ORDER — SODIUM CHLORIDE 0.9 % IV SOLN
2.0000 g | Freq: Once | INTRAVENOUS | Status: AC
  Administered 2020-02-16: 2 g via INTRAVENOUS
  Filled 2020-02-16: qty 2

## 2020-02-16 MED ORDER — VANCOMYCIN HCL IN DEXTROSE 1-5 GM/200ML-% IV SOLN
1000.0000 mg | Freq: Once | INTRAVENOUS | Status: DC
  Filled 2020-02-16: qty 200

## 2020-02-16 MED ORDER — ACETAMINOPHEN 650 MG RE SUPP: 650.0000 mg | Freq: Four times a day (QID) | RECTAL | Status: DC | PRN

## 2020-02-16 MED ORDER — SODIUM CHLORIDE 0.9 % IV SOLN: INTRAVENOUS | Status: DC

## 2020-02-16 MED ORDER — SODIUM CHLORIDE 0.9 % IV SOLN
2.0000 g | Freq: Three times a day (TID) | INTRAVENOUS | Status: DC
  Administered 2020-02-16 – 2020-02-17 (×2): 2 g via INTRAVENOUS
  Filled 2020-02-16 (×4): qty 2

## 2020-02-16 MED ORDER — ACETAMINOPHEN 500 MG PO TABS
1000.0000 mg | ORAL_TABLET | Freq: Once | ORAL | Status: AC
  Administered 2020-02-16: 1000 mg via ORAL
  Filled 2020-02-16: qty 2

## 2020-02-16 NOTE — ED Provider Notes (Signed)
[facility] EMERGENCY DEPARTMENT Provider Note   CSN: [id number] Arrival date & time: 02/15/20  1933     History Chief Complaint  Patient presents with   Near Syncope    Shane [name] is a 54 y.o. male.  The history is provided by the patient.  Near Syncope This is a new problem. The problem has not changed since onset.Associated symptoms include headaches and shortness of breath. Pertinent negatives include no chest pain and no abdominal pain. The symptoms are aggravated by standing. Nothing relieves the symptoms. He has tried nothing for the symptoms.  Patient reports he nearly fainted while fishing on August 7.  Patient reports he was outside for over 8 hours fishing which is not unusual for him.  He reports while he was fishing he began feeling generalized weakness, myalgias, and shortness of breath.  No cough.  He also had mild headache.  No vomiting or diarrhea. No known tick bites.  He does report a small rash noted to his right proximal tibial surface that is new He reports intermittent swelling to both legs for quite a while, worse with the past several weeks. He denies any urinary symptoms.     Past Medical History:  Diagnosis Date   Chronic knee pain    GERD (gastroesophageal reflux disease)    Left shoulder pain    Obesity     Patient Active Problem List   Diagnosis Date Noted   Vitamin D deficiency 02/04/2020   Hyperlipidemia 02/04/2020   Essential hypertension 10/07/2013   Chest pain 09/17/2013   Palpitations 09/17/2013   Acute diarrhea 01/17/2012   Acute gastroenteritis 01/17/2012   Mild dehydration 01/17/2012   Obesity 07/24/2008   GERD 07/24/2008   HEMATURIA, MICROSCOPIC, HX OF 07/24/2008    Past Surgical History:  Procedure Laterality Date   carpal pummel repair Bilateral    CERVICAL DISC SURGERY  2003   per Dr. Sherwood Gambler    COLONOSCOPY  05/10/2018   per Dr. Silverio Decamp, internal hemorrhoids and sessile  serrated polyps, repeat in 3 yrs    KNEE SURGERY Right        Family History  Problem Relation Age of Onset   Heart disease Mother        mitrial valve repair, CABG    Prostate cancer Father    Hypertension Father    Colon cancer Father        in his early 61's   Esophageal cancer Neg Hx    Rectal cancer Neg Hx    Stomach cancer Neg Hx     Social History   Tobacco Use   Smoking status: Never Smoker   Smokeless tobacco: Never Used  Vaping Use   Vaping Use: Never used  Substance Use Topics   Alcohol use: No    Alcohol/week: 0.0 standard drinks   Drug use: No    Home Medications Prior to Admission medications   Medication Sig Start Date End Date Taking? Authorizing Provider  Vitamin D, Ergocalciferol, (DRISDOL) 1.25 MG (50000 UNIT) CAPS capsule Take 1 capsule (50,000 Units total) by mouth every 7 (seven) days for 12 doses. Patient taking differently: Take 50,000 Units by mouth every Sunday.  02/04/20 04/22/20 Yes Erline Hau, MD  doxycycline (VIBRAMYCIN) 100 MG capsule Take 1 capsule (100 mg total) by mouth 2 (two) times daily. 02/16/20   Ripley Fraise, MD    Allergies    Penicillins cross reactors  Review of Systems   Review of Systems  Constitutional: Positive for fatigue and fever.  Respiratory: Positive for shortness of breath. Negative for cough.   Cardiovascular: Positive for near-syncope. Negative for chest pain.  Gastrointestinal: Negative for abdominal pain, diarrhea and vomiting.  Musculoskeletal: Negative for neck pain and neck stiffness.  Skin: Positive for rash.  Neurological: Positive for headaches.  All other systems reviewed and are negative.   Physical Exam Updated Vital Signs BP 101/81    Pulse 91    Temp 99.1 F (37.3 C)    Resp 20    Ht 1.778 m (5\' 10" )    Wt 133.9 kg    SpO2 97%    BMI 42.36 kg/m   Physical Exam CONSTITUTIONAL: Well developed/well nourished HEAD: Normocephalic/atraumatic EYES:  EOMI/PERRL ENMT: Mucous membranes dry NECK: supple no meningeal signs SPINE/BACK:entire spine nontender CV: S1/S2 noted, no murmurs/rubs/gallops noted LUNGS: Decreased breath sounds bilaterally, limited due to body size no apparent distress ABDOMEN: soft, nontender, no rebound or guarding, bowel sounds noted throughout abdomen GU:no cva tenderness, no crepitus or significant erythema to genitalia perineum.  Wife present for exam at patient request NEURO: Pt is awake/alert/appropriate, moves all extremitiesx4.  No facial droop.   EXTREMITIES: pulses normal/equal, full ROM.  No joint swelling or tenderness.  Patient does have edema to bilateral lower extremities, right> left SKIN: warm, small area of erythema to the proximal/lateral aspect of right lower leg.  No crepitus.  There is no petechiae noted.  No rash to palms or soles PSYCH: no abnormalities of mood noted, alert and oriented to situation  ED Results / Procedures / Treatments   Labs (all labs ordered are listed, but only abnormal results are displayed) Labs Reviewed  BASIC METABOLIC PANEL - Abnormal; Notable for the following components:      Result Value   Glucose, Bld 106 (*)    All other components within normal limits  CBC - Abnormal; Notable for the following components:   WBC 15.0 (*)    All other components within normal limits  URINALYSIS, ROUTINE W REFLEX MICROSCOPIC - Abnormal; Notable for the following components:   Hgb urine dipstick SMALL (*)    All other components within normal limits  ROCKY MTN SPOTTED FVR ABS PNL(IGG+IGM)  POC SARS CORONAVIRUS 2 AG -  ED    EKG EKG Interpretation  Date/Time:  Saturday February 15 2020 19:53:20 EDT Ventricular Rate:  118 PR Interval:  188 QRS Duration: 102 QT Interval:  326 QTC Calculation: 456 R Axis:   31 Text Interpretation: Sinus tachycardia Abnormal ECG No significant change since last tracing Confirmed by Ripley Fraise [phone]) on 02/16/2020 6:04:51  AM   Radiology No results found.  Procedures Procedures   Medications Ordered in ED Medications  sodium chloride flush (NS) 0.9 % injection 3 mL (has no administration in time range)  lactated ringers bolus 1,000 mL (has no administration in time range)  ketorolac (TORADOL) 30 MG/ML injection 30 mg (has no administration in time range)  ondansetron (ZOFRAN-ODT) disintegrating tablet 4 mg (4 mg Oral Given 02/15/20 2246)    ED Course  I have reviewed the triage vital signs and the nursing notes.  Pertinent labs  results that were available during my care of the patient were reviewed by me and considered in my medical decision making (see chart for details).    MDM Rules/Calculators/A&P                          7:14 AM Patient presented  with generalized weakness, fever and shortness of breath.  He reports this occurred while he was fishing.  Unclear etiology of fever.  He has not been vaccinated for COVID-19. Plan is to check Covid screen, chest x-ray and RMSF panel. If initial testing is negative patient is improved, he can be discharged home.  Will place on doxycycline for possible cellulitis to right leg.  Doxycycline has been sent to pharmacy Signed out to Dr. Laverta Baltimore at shift change  Final Clinical Impression(s) / ED Diagnoses Final diagnoses:  Acute febrile illness  Near syncope    Rx / DC Orders ED Discharge Orders         Ordered    doxycycline (VIBRAMYCIN) 100 MG capsule  2 times daily     Discontinue  Reprint     02/16/20 0656           Ripley Fraise, MD 02/16/20 [phone]

## 2020-02-16 NOTE — ED Notes (Signed)
The pt has been feeling  Nauseated since 1630 some low grade temp  Body aches  Hx of the same

## 2020-02-16 NOTE — H&P (Signed)
History and Physical    Shane [name] ZOX:[id number] DOB: February 01, 1966 DOA: 02/15/2020  PCP: Isaac Bliss, Rayford Halsted, MD  Patient coming from: Home  I have personally briefly reviewed patient's old medical records in [place]  Chief Complaint: Dizziness, weakness and body ache  HPI: Shane [name] is a 54 y.o. male with medical history significant of diet-controlled hypertension, GERD and obesity presented to ED on the evening of 02/15/2020 with several complaints.  According to patient, he was fishing with his 6 year old son when he suddenly felt very hot and very weak and started having pain in bilateral flanks which then progressed to body ache all over the body.  He decided to come home and while he was driving, he was feeling so dizzy to the point that he thought he would pass out.  Somehow, he did well and arrived at home.  Upon arrival to home, he had some shortness of breath and weakness got worse so he asked his wife to take him to the ED.  Denies any other complaints such as fever, chills, sweating, diarrhea, nausea, vomiting, any problem with urination, cough, any recent travel or any sick contact.  Patient is not vaccinated against COVID-19.  Patient hikes a lot in the woods and has some erythema in the left lower extremity.  [facility] spotted fever test has been ordered by ED physician.  ED Course: Upon arrival to ED, patient was febrile with 100.7 and tachycardic at 119.  Blood pressure was normal.  Chest x-ray unremarkable.  UA unremarkable.  Lactic acid 2.0.  Leukocytosis of 15.  BMP was within normal range.  Rapid test for COVID-19 was negative but PCR is pending.  Review of Systems: As per HPI otherwise negative.    Past Medical History:  Diagnosis Date  . Chronic knee pain   . GERD (gastroesophageal reflux disease)   . Left shoulder pain   . Obesity     Past Surgical History:  Procedure Laterality Date  . carpal pummel repair Bilateral   . CERVICAL  DISC SURGERY  2003   per Dr. Sherwood Gambler   . COLONOSCOPY  05/10/2018   per Dr. Silverio Decamp, internal hemorrhoids and sessile serrated polyps, repeat in 3 yrs   . KNEE SURGERY Right      reports that he has never smoked. He has never used smokeless tobacco. He reports that he does not drink alcohol and does not use drugs.  Allergies  Allergen Reactions  . Penicillins Cross Reactors Rash    Family History  Problem Relation Age of Onset  . Heart disease Mother        mitrial valve repair, CABG   . Prostate cancer Father   . Hypertension Father   . Colon cancer Father        in his early 45's  . Esophageal cancer Neg Hx   . Rectal cancer Neg Hx   . Stomach cancer Neg Hx     Prior to Admission medications   Medication Sig Start Date End Date Taking? Authorizing Provider  Vitamin D, Ergocalciferol, (DRISDOL) 1.25 MG (50000 UNIT) CAPS capsule Take 1 capsule (50,000 Units total) by mouth every 7 (seven) days for 12 doses. Patient taking differently: Take 50,000 Units by mouth every Sunday.  02/04/20 04/22/20 Yes Erline Hau, MD  doxycycline (VIBRAMYCIN) 100 MG capsule Take 1 capsule (100 mg total) by mouth 2 (two) times daily. 02/16/20   Ripley Fraise, MD    Physical Exam: Vitals:  02/16/20 0930 02/16/20 1029 02/16/20 1151 02/16/20 1230  BP: 126/79   (!) 115/55  Pulse: (!) 102   99  Resp: 18   18  Temp:  (!) 102.7 F (39.3 C) 99.8 F (37.7 C)   TempSrc:  Oral Oral   SpO2: 95%   95%  Weight:      Height:        Constitutional: NAD, calm, comfortable Vitals:   02/16/20 0930 02/16/20 1029 02/16/20 1151 02/16/20 1230  BP: 126/79   (!) 115/55  Pulse: (!) 102   99  Resp: 18   18  Temp:  (!) 102.7 F (39.3 C) 99.8 F (37.7 C)   TempSrc:  Oral Oral   SpO2: 95%   95%  Weight:      Height:       Eyes: PERRL, lids and conjunctivae normal, obese ENMT: Mucous membranes are moist. Posterior pharynx clear of any exudate or lesions.Normal dentition.  Neck: normal,  supple, no masses, no thyromegaly Respiratory: clear to auscultation bilaterally, no wheezing, no crackles. Normal respiratory effort. No accessory muscle use.  Cardiovascular: Regular rate and rhythm, no murmurs / rubs / gallops. No extremity edema. 2+ pedal pulses. No carotid bruits.  Abdomen: no tenderness, no masses palpated. No hepatosplenomegaly. Bowel sounds positive.  Musculoskeletal: no clubbing / cyanosis. No joint deformity upper and lower extremities. Good ROM, no contractures. Normal muscle tone.  Skin: Erythema which is well marked on the left lower extremity however it is very mild.  No open sores or drainage.  Somewhat very very mild erythema on the right lower extremity just over the ankle as well. Neurologic: CN 2-12 grossly intact. Sensation intact, DTR normal. Strength 5/5 in all 4.  Psychiatric: Poor judgment and insight. Alert and oriented x 3. Normal mood.    Labs on Admission: I have personally reviewed following labs and imaging studies  CBC: Recent Labs  Lab 02/15/20 2003  WBC 15.0*  HGB 15.0  HCT 47.9  MCV 90.4  PLT 932   Basic Metabolic Panel: Recent Labs  Lab 02/15/20 2003  NA 139  K 4.0  CL 103  CO2 26  GLUCOSE 106*  BUN 10  CREATININE 1.14  CALCIUM 9.3   GFR: Estimated Creatinine Clearance: 103.2 mL/min (by C-G formula based on SCr of 1.14 mg/dL). Liver Function Tests: No results for input(s): AST, ALT, ALKPHOS, BILITOT, PROT, ALBUMIN in the last 168 hours. No results for input(s): LIPASE, AMYLASE in the last 168 hours. No results for input(s): AMMONIA in the last 168 hours. Coagulation Profile: No results for input(s): INR, PROTIME in the last 168 hours. Cardiac Enzymes: No results for input(s): CKTOTAL, CKMB, CKMBINDEX, TROPONINI in the last 168 hours. BNP (last 3 results) No results for input(s): PROBNP in the last 8760 hours. HbA1C: No results for input(s): HGBA1C in the last 72 hours. CBG: No results for input(s): GLUCAP in the  last 168 hours. Lipid Profile: No results for input(s): CHOL, HDL, LDLCALC, TRIG, CHOLHDL, LDLDIRECT in the last 72 hours. Thyroid Function Tests: No results for input(s): TSH, T4TOTAL, FREET4, T3FREE, THYROIDAB in the last 72 hours. Anemia Panel: No results for input(s): VITAMINB12, FOLATE, FERRITIN, TIBC, IRON, RETICCTPCT in the last 72 hours. Urine analysis:    Component Value Date/Time   COLORURINE YELLOW 02/15/2020 2304   APPEARANCEUR CLEAR 02/15/2020 2304   LABSPEC 1.019 02/15/2020 2304   PHURINE 7.0 02/15/2020 2304   GLUCOSEU NEGATIVE 02/15/2020 2304   GLUCOSEU NEGATIVE 09/04/2018 1016   HGBUR  SMALL (A) 02/15/2020 2304   HGBUR negative 04/26/2010 0852   BILIRUBINUR NEGATIVE 02/15/2020 2304   BILIRUBINUR neg 07/10/2015 [place] 02/15/2020 2304   PROTEINUR NEGATIVE 02/15/2020 2304   UROBILINOGEN 1.0 09/04/2018 1016   NITRITE NEGATIVE 02/15/2020 2304   LEUKOCYTESUR NEGATIVE 02/15/2020 2304    Radiological Exams on Admission: DG Chest Port 1 View  Result Date: 02/16/2020 CLINICAL DATA:  Shortness of breath EXAM: PORTABLE CHEST 1 VIEW COMPARISON:  None. FINDINGS: Lungs are clear.  No pleural effusion or pneumothorax. Cardiomegaly. Cervical spine fixation hardware. IMPRESSION: No evidence of acute cardiopulmonary disease. Electronically Signed   By: Julian Hy M.D.   On: 02/16/2020 07:22    EKG: Independently reviewed.  Sinus tachycardia, no ST-T wave changes  Assessment/Plan Active Problems:   Obesity   Essential hypertension   Sepsis ([place])   Cellulitis of left lower extremity   Sepsis secondary to left lower extremity cellulitis, POA: Patient met sepsis criteria based on fever of 100.7, tachycardia as well as leukocytosis.  Source so far seems to be left lower extremity however he has such a mild erythema while he has sepsis parameters which are out of proportion for this mild erythema.  No other source is obvious at this point in time.  Activated  sepsis protocol and cellulitis order set.  Will start on cefepime.  Follow blood culture and tailor antibiotics accordingly.  Repeat labs in the morning.  Follow fever curve closely.  He has received only 2 L of IV fluid in the ED.  I requested ED physician to complete 30 mg/kg fluid bolus per sepsis protocol.  I will then start him on normal saline at 125 cc/h.  History of essential hypertension: Diet controlled.  Blood pressure very labile.  Will start him on as needed hydralazine for now.  Patient and vaccinated against COVID-19.  When discussed about the reason, he states " I do not believe in vaccine".  I then informed the patient about recent delta wave and majority of patients needing hospitalization being unvaccinated and tried to counsel him however he stated " I will leave that up to God".   DVT prophylaxis: enoxaparin (LOVENOX) injection 40 mg Start: 02/16/20 1245 Code Status: Full code Family Communication: Wife present at bedside.  Plan of care discussed with patient in length and he verbalized understanding and agreed with it. Disposition Plan: Likely home in next 2 days Consults called: None Admission status: Inpatient   Status is: Inpatient  Remains inpatient appropriate because:Inpatient level of care appropriate due to severity of illness   Dispo: The patient is from: Home              Anticipated d/c is to: Home              Anticipated d/c date is: 2 days              Patient currently is not medically stable to d/c.   Darliss Cheney MD [facility]  02/16/2020, 12:45 PM  To contact the attending provider between 7A-7P or the covering provider during after hours 7P-7A, please log into the web site [email or web address]

## 2020-02-16 NOTE — Progress Notes (Signed)
Pharmacy Antibiotic Note  Shane [name] is a 54 y.o. male admitted on 02/15/2020 with sepsis. Patient initially presented with generalized weakness, fever, SOB. While in the ED he continued to spike fevers (Tm 102.7), his WBC was 15, LA 2.0, Scr 1.14, normalized CrCl 76.3. Given suspicion of early onset sepsis, he is to be started on broad coverage with IV antibiotics. Pharmacy has been consulted for vancomycin and cefepime dosing. Flagyl also started per MD.   Plan: Vancomycin 2000 mg bolus x1, then 1000 mg q12h  Cefepime 2 g q8h  F/u daily CBC and Scr Obtain vancomycin levels as clinically indicated F/u blood cultures   Height: 5\' 10"  (177.8 cm) Weight: 133.9 kg (295 lb 3.1 oz) IBW/kg (Calculated) : 73  Temp (24hrs), Avg:100.6 F (38.1 C), Min:99.1 F (37.3 C), Max:102.7 F (39.3 C)  Recent Labs  Lab 02/15/20 2003 02/16/20 1011  WBC 15.0*  --   CREATININE 1.14  --   LATICACIDVEN  --  2.0*    Estimated Creatinine Clearance: 103.2 mL/min (by C-G formula based on SCr of 1.14 mg/dL).    Allergies  Allergen Reactions  . Penicillins Cross Reactors Rash    Antimicrobials this admission: Vancomycin 8/8 >> Cefepime 8/8 >>   Dose adjustments this admission: N/A  Microbiology results: 8/8 BCx x2:  8/8 COVID: negative    Thank you for allowing pharmacy to be a part of this patient's care.  Claudina Lick, PharmD PGY1 Acute Care Pharmacy Resident 02/16/2020 1:02 PM  Please check [email or web address] for unit-specific pharmacy phone numbers.

## 2020-02-16 NOTE — ED Notes (Signed)
Report given to [address] [place] 

## 2020-02-16 NOTE — ED Provider Notes (Signed)
Blood pressure 101/81, pulse 91, temperature 99.1 F (37.3 C), resp. rate 20, height 5\' 10"  (1.778 m), weight 133.9 kg, SpO2 97 %.  Assuming care from Dr. Christy Gentles.  In short, Shane [name] is a 54 y.o. male with a chief complaint of Near Syncope .  Refer to the original H&P for additional details.  The current plan of care is to f/u on COVID test and CXR.  09:57 AM  Patient's lab work has resulted.  Antigen Covid test is negative.  Plan to repeat PCR testing.  Patient does have leukocytosis to 15.  Heart rate has improved after fever reduction. Exam not consistent with fasciitis.  No palpable abscess.  No joint effusion. Will add on lactate, blood cultures, and tick borne infection labs. Patient to follow COVID PCR test in MyChart app.   11:40 AM  Patient is spiking fever again in the emergency department. In this setting I did send blood cultures with some persistent tachycardia noted on my reevaluation. Patient is febrile now to 102.7 F. Lactate sent in addition to blood cultures and additional IV fluids and is coming back mildly elevated at 2.0. I reexamined the patient. The rash in the leg is unchanged. Patient has some chronic redness in the perineum but no abscess, skin breakdown, clinical signs to suspect Fournier's. Plan for IV antibiotics and admit for possible early sepsis.   Discussed patient's case with [facility] to request admission. Patient and family (if present) updated with plan. Care transferred to [facility] service.  I reviewed all nursing notes, vitals, pertinent old records, EKGs, labs, imaging (as available).  CRITICAL CARE Performed by: Margette Fast Total critical care time: 35 minutes Critical care time was exclusive of separately billable procedures and treating other patients. Critical care was necessary to treat or prevent imminent or life-threatening deterioration. Critical care was time spent personally by me on the following activities: development of treatment plan  with patient and/or surrogate as well as nursing, discussions with consultants, evaluation of patient's response to treatment, examination of patient, obtaining history from patient or surrogate, ordering and performing treatments and interventions, ordering and review of laboratory studies, ordering and review of radiographic studies, pulse oximetry and re-evaluation of patient's condition.  Shane Quinton, MD Emergency Medicine    Shane [name], Wonda Old[facility], MD 02/17/20 [phone]

## 2020-02-17 LAB — CORTISOL-AM, BLOOD: Cortisol - AM: 17.9 ug/dL (ref 6.7–22.6)

## 2020-02-17 LAB — COMPREHENSIVE METABOLIC PANEL
ALT: 19 U/L (ref 0–44)
AST: 18 U/L (ref 15–41)
Albumin: 3 g/dL — ABNORMAL LOW (ref 3.5–5.0)
Alkaline Phosphatase: 41 U/L (ref 38–126)
Anion gap: 8 (ref 5–15)
BUN: 15 mg/dL (ref 6–20)
CO2: 24 mmol/L (ref 22–32)
Calcium: 8.1 mg/dL — ABNORMAL LOW (ref 8.9–10.3)
Chloride: 103 mmol/L (ref 98–111)
Creatinine, Ser: 1.12 mg/dL (ref 0.61–1.24)
GFR calc Af Amer: >60 mL/min (ref >60)
GFR calc non Af Amer: >60 mL/min (ref >60)
Glucose, Bld: 116 mg/dL — ABNORMAL HIGH (ref 70–99)
Potassium: 4 mmol/L (ref 3.5–5.1)
Sodium: 135 mmol/L (ref 135–145)
Total Bilirubin: 2.1 mg/dL — ABNORMAL HIGH (ref 0.3–1.2)
Total Protein: 6 g/dL — ABNORMAL LOW (ref 6.5–8.1)

## 2020-02-17 LAB — CBC
HCT: 39.7 % (ref 39.0–52.0)
Hemoglobin: 12.7 g/dL — ABNORMAL LOW (ref 13.0–17.0)
MCH: 29.1 pg (ref 26.0–34.0)
MCHC: 32 g/dL (ref 30.0–36.0)
MCV: 90.8 fL (ref 80.0–100.0)
Platelets: 127 10*3/uL — ABNORMAL LOW (ref 150–400)
RBC: 4.37 MIL/uL (ref 4.22–5.81)
RDW: 14.4 % (ref 11.5–15.5)
WBC: 9.8 10*3/uL (ref 4.0–10.5)
nRBC: 0 % (ref 0.0–0.2)

## 2020-02-17 LAB — PROTIME-INR
INR: 1.4 — ABNORMAL HIGH (ref 0.8–1.2)
Prothrombin Time: 16.1 seconds — ABNORMAL HIGH (ref 11.4–15.2)

## 2020-02-17 LAB — PROCALCITONIN: Procalcitonin: 1.69 ng/mL

## 2020-02-17 LAB — LACTIC ACID, PLASMA: Lactic Acid, Venous: 1.1 mmol/L (ref 0.5–1.9)

## 2020-02-17 MED ORDER — SODIUM CHLORIDE 0.9 % IV SOLN: INTRAVENOUS | Status: AC

## 2020-02-17 MED ORDER — DOXYCYCLINE HYCLATE 100 MG PO TABS
100.0000 mg | ORAL_TABLET | Freq: Two times a day (BID) | ORAL | Status: DC
  Administered 2020-02-17 – 2020-02-18 (×3): 100 mg via ORAL
  Filled 2020-02-17 (×4): qty 1

## 2020-02-17 MED ORDER — ENOXAPARIN SODIUM 80 MG/0.8ML ~~LOC~~ SOLN
0.5000 mg/kg | SUBCUTANEOUS | Status: DC
  Administered 2020-02-17 – 2020-02-18 (×2): 67.5 mg via SUBCUTANEOUS
  Filled 2020-02-17: qty 0.8

## 2020-02-17 MED ORDER — CEFAZOLIN SODIUM-DEXTROSE 2-4 GM/100ML-% IV SOLN
2.0000 g | Freq: Three times a day (TID) | INTRAVENOUS | Status: DC
  Administered 2020-02-17 – 2020-02-18 (×5): 2 g via INTRAVENOUS
  Filled 2020-02-17 (×5): qty 100

## 2020-02-17 NOTE — Progress Notes (Signed)
PROGRESS NOTE    Shane [name]  HYW:[id number] DOB: 1965/09/13 DOA: 02/15/2020 PCP: Shane [name], Shane Halsted, MD      Brief Narrative:  Shane [name] is a 53 y.o. M with obesity who presented with 1 day fever and chills and myalgias.  In the ER, patient temp 102F and HR 119, lactate 2.0 --> 3.5.  EDP ordered RMSF serologies but not doxycycline.  Patient noted to have some erythema on the leg and was started on vancomycin, cefepime and Flagyl and admitted.          Assessment & Plan:  Severe sepsis unclear source P/w fever, tachycardia, likely cellulitis/tickborne illness and lactate 3.5  I think normal UA rules out sepsis from UTI.  No abdominal symptoms. -Narrow antibiotics to cefazolin for cellulitis -Add empiric doxycycline for tickborne disease -Follow blood cultures   Thrombocytopenia Mild         Disposition: Status is: Inpatient  Remains inpatient appropriate because:Hemodynamically unstable and remains tachycardic, febrile in last 24 hours   Dispo: The patient is from: Home              Anticipated d/c is to: Home              Anticipated d/c date is: 1 day              Patient currently is not medically stable to d/c.    Patient admitted with sepsis.  This appears to be from a left lower leg cellulitis, but may also be tickborne.  I will treat the former, treat empirically for the latter.    Likely home tomororw with oral therapy if his HR and RR and BP are normal.          MDM: The below labs and imaging reports were reviewed and summarized above.  Medication management as above.   DVT prophylaxis: Lovenox  Code Status: FULL Family Communication:     Consultants:     Procedures:     Antimicrobials:   Vanc/Cefepime/Flagyl 8/8 >> 8/9  Cefazolin 8/9 >>   Doxycycline 8/9 >>    Culture data:   8/8 blood culture x2 -- ngtd           Subjective: Feeling well.  Some groin pain.  Mild fever yesterday  afternoon.  No confusion, headche, rash, neck pain.  No photophobia.  Redness on hte leg is better.  The left lower leg is red and has tenderness and swelling.  Objective: Vitals:   02/16/20 2124 02/17/20 0737 02/17/20 1552 02/17/20 1947  BP: 130/68 112/65 120/69 (!) 135/54  Pulse: 90 82 78 81  Resp: 16 19 19 20   Temp: 98.5 F (36.9 C) 99.8 F (37.7 C) 99.2 F (37.3 C) 99.1 F (37.3 C)  TempSrc: Oral     SpO2: 97% 96% 100% 99%  Weight:      Height:        Intake/Output Summary (Last 24 hours) at 02/17/2020 2049 Last data filed at 02/17/2020 1700 Gross per 24 hour  Intake 1325.31 ml  Output 900 ml  Net 425.31 ml   Filed Weights   02/16/20 0540  Weight: 133.9 kg    Examination: General appearance: obese adult male, alert and in no acute distress.   HEENT: Anicteric, conjunctiva pink, lids and lashes normal. No nasal deformity, discharge, epistaxis.  Lips moist.   Skin: Warm and dry.  no jaundice.  No suspicious rashes or lesions. There is patchy redness on the left lower leg,  with numerous excoriations surrounding.   Cardiac: Tachycardic, nl S1-S2, no murmurs appreciated.  Capillary refill is brisk.  JVP normal  No LE edema.  Radial  pulses 2+ and symmetric. Respiratory: Normal respiratory rate and rhythm.  CTAB without rales or wheezes. Abdomen: Abdomen soft.  no TTP. No ascites, distension, hepatosplenomegaly.   GU: No epididymal pain or swelling.  . Neuro: Awake and alert.  EOMI, moves all extremities. Speech fluent.    Psych: Sensorium intact and responding to questions, attention normal. Affect normal.  Judgment and insight appear normal.    Data Reviewed: I have personally reviewed following labs and imaging studies:  CBC: Recent Labs  Lab 02/15/20 2003 02/16/20 1754 02/17/20 0124  WBC 15.0* 12.3* 9.8  HGB 15.0 12.6* 12.7*  HCT 47.9 40.3 39.7  MCV 90.4 90.6 90.8  PLT 176 126* 081*   Basic Metabolic Panel: Recent Labs  Lab 02/15/20 2003 02/16/20 1346  02/17/20 0124  NA 139  --  135  K 4.0  --  4.0  CL 103  --  103  CO2 26  --  24  GLUCOSE 106*  --  116*  BUN 10  --  15  CREATININE 1.14 1.20 1.12  CALCIUM 9.3  --  8.1*  MG  --  1.7  --    GFR: Estimated Creatinine Clearance: 105.1 mL/min (by C-G formula based on SCr of 1.12 mg/dL). Liver Function Tests: Recent Labs  Lab 02/16/20 1346 02/17/20 0124  AST 23 18  ALT 21 19  ALKPHOS 43 41  BILITOT 2.1* 2.1*  PROT 6.7 6.0*  ALBUMIN 3.4* 3.0*   Recent Labs  Lab 02/16/20 1346  LIPASE 22   No results for input(s): AMMONIA in the last 168 hours. Coagulation Profile: Recent Labs  Lab 02/17/20 0124  INR 1.4*   Cardiac Enzymes: No results for input(s): CKTOTAL, CKMB, CKMBINDEX, TROPONINI in the last 168 hours. BNP (last 3 results) No results for input(s): PROBNP in the last 8760 hours. HbA1C: No results for input(s): HGBA1C in the last 72 hours. CBG: No results for input(s): GLUCAP in the last 168 hours. Lipid Profile: No results for input(s): CHOL, HDL, LDLCALC, TRIG, CHOLHDL, LDLDIRECT in the last 72 hours. Thyroid Function Tests: Recent Labs    02/16/20 1346  TSH 0.527   Anemia Panel: No results for input(s): VITAMINB12, FOLATE, FERRITIN, TIBC, IRON, RETICCTPCT in the last 72 hours. Urine analysis:    Component Value Date/Time   COLORURINE YELLOW 02/15/2020 2304   APPEARANCEUR CLEAR 02/15/2020 2304   LABSPEC 1.019 02/15/2020 2304   PHURINE 7.0 02/15/2020 2304   GLUCOSEU NEGATIVE 02/15/2020 2304   GLUCOSEU NEGATIVE 09/04/2018 1016   HGBUR SMALL (A) 02/15/2020 2304   HGBUR negative 04/26/2010 0852   BILIRUBINUR NEGATIVE 02/15/2020 2304   BILIRUBINUR neg 07/10/2015 0849   KETONESUR NEGATIVE 02/15/2020 2304   PROTEINUR NEGATIVE 02/15/2020 2304   UROBILINOGEN 1.0 09/04/2018 1016   NITRITE NEGATIVE 02/15/2020 2304   LEUKOCYTESUR NEGATIVE 02/15/2020 2304   Sepsis Labs: @LABRCNTIP (procalcitonin:4,lacticacidven:4)  ) Recent Results (from the past 240  hour(s))  Culture, blood (routine x 2)     Status: None (Preliminary result)   Collection Time: 02/16/20 10:11 AM   Specimen: BLOOD  Result Value Ref Range Status   Specimen Description BLOOD RIGHT ANTECUBITAL  Final   Special Requests   Final    BOTTLES DRAWN AEROBIC AND ANAEROBIC Blood Culture adequate volume   Culture   Final    NO GROWTH < 24 HOURS  Performed at [facility], [place] [address] [place], [place] [address]    Report Status PENDING  Incomplete  SARS Coronavirus 2 by RT PCR (hospital order, performed in [facility] hospital lab) Nasopharyngeal Nasopharyngeal Swab     Status: None   Collection Time: 02/16/20 10:29 AM   Specimen: Nasopharyngeal Swab  Result Value Ref Range Status   SARS Coronavirus 2 NEGATIVE NEGATIVE Final    Comment: (NOTE) SARS-CoV-2 target nucleic acids are NOT DETECTED.  The SARS-CoV-2 RNA is generally detectable in upper and lower respiratory specimens during the acute phase of infection. The lowest concentration of SARS-CoV-2 viral copies this assay can detect is 250 copies / mL. A negative result does not preclude SARS-CoV-2 infection and should not be used as the sole basis for treatment or other patient management decisions.  A negative result may occur with improper specimen collection / handling, submission of specimen other than nasopharyngeal swab, presence of viral mutation(s) within the areas targeted by this assay, and inadequate number of viral copies (<250 copies / mL). A negative result must be combined with clinical observations, patient history, and epidemiological information.  Fact Sheet for Patients:   StrictlyIdeas.no  Fact Sheet for Healthcare Providers: BankingDealers.co.za  This test is not yet approved or  cleared by the [place] FDA and has been authorized for detection and/or diagnosis of SARS-CoV-2 by FDA under an Emergency Use Authorization (EUA).  This EUA  will remain in effect (meaning this test can be used) for the duration of the COVID-19 declaration under Section 564(b)(1) of the Act, 21 U.S.C. section 360bbb-3(b)(1), unless the authorization is terminated or revoked sooner.  Performed at [facility], [place] [address] [place], [place] [address]   Culture, blood (routine x 2)     Status: None (Preliminary result)   Collection Time: 02/16/20  6:01 PM   Specimen: BLOOD  Result Value Ref Range Status   Specimen Description BLOOD RIGHT ANTECUBITAL  Final   Special Requests   Final    BOTTLES DRAWN AEROBIC AND ANAEROBIC Blood Culture adequate volume   Culture   Final    NO GROWTH < 24 HOURS Performed at [facility], [place] [address], [place],  [address]    Report Status PENDING  Incomplete         Radiology Studies: DG Chest Port 1 View  Result Date: 02/16/2020 CLINICAL DATA:  Shortness of breath EXAM: PORTABLE CHEST 1 VIEW COMPARISON:  None. FINDINGS: Lungs are clear.  No pleural effusion or pneumothorax. Cardiomegaly. Cervical spine fixation hardware. IMPRESSION: No evidence of acute cardiopulmonary disease. Electronically Signed   By: Julian Hy M.D.   On: 02/16/2020 07:22        Scheduled Meds: . doxycycline  100 mg Oral Q12H  . enoxaparin (LOVENOX) injection  0.5 mg/kg Subcutaneous Q24H   Continuous Infusions: .  ceFAZolin (ANCEF) IV 2 g (02/17/20 1746)     LOS: 1 day    Time spent: 25 minutes    Edwin Dada, MD [facility] 02/17/2020, 8:49 PM     Please page though [place] or Epic secure chat:  For [employer], Adult nurse

## 2020-02-18 ENCOUNTER — Encounter: Payer: Self-pay | Admitting: Internal Medicine

## 2020-02-18 LAB — BASIC METABOLIC PANEL
Anion gap: 9 (ref 5–15)
BUN: 12 mg/dL (ref 6–20)
CO2: 24 mmol/L (ref 22–32)
Calcium: 8.3 mg/dL — ABNORMAL LOW (ref 8.9–10.3)
Chloride: 105 mmol/L (ref 98–111)
Creatinine, Ser: 0.93 mg/dL (ref 0.61–1.24)
GFR calc Af Amer: >60 mL/min (ref >60)
GFR calc non Af Amer: >60 mL/min (ref >60)
Glucose, Bld: 146 mg/dL — ABNORMAL HIGH (ref 70–99)
Potassium: 3.5 mmol/L (ref 3.5–5.1)
Sodium: 138 mmol/L (ref 135–145)

## 2020-02-18 MED ORDER — DOXYCYCLINE HYCLATE 100 MG PO CAPS
100.0000 mg | ORAL_CAPSULE | Freq: Two times a day (BID) | ORAL | 0 refills | Status: AC
Start: 2020-02-18 — End: 2020-02-26

## 2020-02-18 NOTE — Discharge Summary (Signed)
[name]:[id number] DOB: [birth year] DOA: 02/15/2020  PCP: Isaac Bliss, Rayford Halsted, MD  Admit date: 02/15/2020  Discharge date: 02/18/2020  Admitted From: Home   disposition: Home   Recommendations for Outpatient Follow-up:   Follow up with PCP in 1-2 weeks  Home Health: N/A Equipment/Devices: N/A Consultations: None Discharge Condition: Improved CODE STATUS: Full Diet Recommendation: Heart Healthy   Diet Order            Diet general           Diet Heart Room service appropriate? Yes; Fluid consistency: Thin  Diet effective now                  Chief Complaint  Patient presents with  . Near Syncope     Brief history of present illness from the day of admission and additional interim summary     Shane [name] is a 54 y.o. male with medical history significant of diet-controlled hypertension, GERD and obesity presented to ED on the evening of 02/15/2020 with several complaints.  According to patient, he was fishing with his 24 year old son when he suddenly felt very hot and very weak and started having pain in bilateral flanks which then progressed to body ache all over the body.  He decided to come home and while he was driving, he was feeling so dizzy to the point that he thought he would pass out.  Somehow, he did well and arrived at home.  Upon arrival to home, he had some shortness of breath and weakness got worse so he asked his wife to take him to the ED.  Denies any other complaints such as fever, chills, sweating, diarrhea, nausea, vomiting, any problem with urination, cough, any recent travel or any sick contact.  Patient is not vaccinated against COVID-19.  Patient hikes a lot in the woods and has some erythema in the left lower extremity.  [facility] spotted fever test has been  ordered by ED physician.                                                                  Hospital Course    Patient was noted to be febrile, tachycardic with leukocytosis of 15.  He was also noted to have cellulitis of his left lower extremity.  He met criteria for SIRS.  Patient was treated with fluid resuscitation and started on spectrum antibiotics including vancomycin, cefepime and Flagyl.  Lactate was noted to be 3.5.  Patient continued to improve with antibiotics and fluids.  The day after admission, doxycycline was added for possible tickborne disease.  Antibiotics were narrowed to cefazolin for cellulitis.  Cellulitis continue to improve with improvement in his blood pressure and leukocytosis.  Patient also continued to feel clinically improved.  Patient was  discharged home with improved cellulitis to complete a 10-day course of doxycycline.    Discharge diagnosis     Active Problems:   Obesity   Essential hypertension   Sepsis ([place])   Cellulitis of left lower extremity    Discharge instructions    Discharge Instructions    Call MD for:  redness, tenderness, or signs of infection (pain, swelling, redness, odor or green/yellow discharge around incision site)   Complete by: As directed    Call MD for:  temperature >100.4   Complete by: As directed    Diet general   Complete by: As directed    Discharge instructions   Complete by: As directed    1.  Take doxycycline 100 mg twice a day for 8 more days. 2.  You should see progressive improvement in the rash in your leg. 3.  If your rash looks worse, or if you develop a new fever, please call your PCP immediately. 4.  Make sure you follow-up with your PCP within the week to make sure you are still doing fine.   Increase activity slowly   Complete by: As directed       Discharge Medications   Allergies as of 02/18/2020      Reactions   Penicillins Cross Reactors Rash      Medication List    TAKE these  medications   doxycycline 100 MG capsule Commonly known as: VIBRAMYCIN Take 1 capsule (100 mg total) by mouth 2 (two) times daily for 8 days.   Vitamin D (Ergocalciferol) 1.25 MG (50000 UNIT) Caps capsule Commonly known as: DRISDOL Take 1 capsule (50,000 Units total) by mouth every 7 (seven) days for 12 doses. What changed: when to take this         Major procedures and Radiology Reports - PLEASE review detailed and final reports thoroughly  -       DG Chest Port 1 View  Result Date: 02/16/2020 CLINICAL DATA:  Shortness of breath EXAM: PORTABLE CHEST 1 VIEW COMPARISON:  None. FINDINGS: Lungs are clear.  No pleural effusion or pneumothorax. Cardiomegaly. Cervical spine fixation hardware. IMPRESSION: No evidence of acute cardiopulmonary disease. Electronically Signed   By: Julian Hy M.D.   On: 02/16/2020 07:22    Micro Results   Recent Results (from the past 240 hour(s))  Culture, blood (routine x 2)     Status: None (Preliminary result)   Collection Time: 02/16/20 10:11 AM   Specimen: BLOOD  Result Value Ref Range Status   Specimen Description BLOOD RIGHT ANTECUBITAL  Final   Special Requests   Final    BOTTLES DRAWN AEROBIC AND ANAEROBIC Blood Culture adequate volume   Culture   Final    NO GROWTH 2 DAYS Performed at [facility], [address] [place], [place] [address]    Report Status PENDING  Incomplete  SARS Coronavirus 2 by RT PCR (hospital order, performed in [facility] hospital lab) Nasopharyngeal Nasopharyngeal Swab     Status: None   Collection Time: 02/16/20 10:29 AM   Specimen: Nasopharyngeal Swab  Result Value Ref Range Status   SARS Coronavirus 2 NEGATIVE NEGATIVE Final    Comment: (NOTE) SARS-CoV-2 target nucleic acids are NOT DETECTED.  The SARS-CoV-2 RNA is generally detectable in upper and lower respiratory specimens during the acute phase of infection. The lowest concentration of SARS-CoV-2 viral copies this assay can detect is  250 copies / mL. A negative result does not preclude SARS-CoV-2 infection and should  not be used as the sole basis for treatment or other patient management decisions.  A negative result may occur with improper specimen collection / handling, submission of specimen other than nasopharyngeal swab, presence of viral mutation(s) within the areas targeted by this assay, and inadequate number of viral copies (<250 copies / mL). A negative result must be combined with clinical observations, patient history, and epidemiological information.  Fact Sheet for Patients:   StrictlyIdeas.no  Fact Sheet for Healthcare Providers: BankingDealers.co.za  This test is not yet approved or  cleared by the [place] FDA and has been authorized for detection and/or diagnosis of SARS-CoV-2 by FDA under an Emergency Use Authorization (EUA).  This EUA will remain in effect (meaning this test can be used) for the duration of the COVID-19 declaration under Section 564(b)(1) of the Act, 21 U.S.C. section 360bbb-3(b)(1), unless the authorization is terminated or revoked sooner.  Performed at [facility], [place] [address] [place], [place] [address]   Culture, blood (routine x 2)     Status: None (Preliminary result)   Collection Time: 02/16/20  6:01 PM   Specimen: BLOOD  Result Value Ref Range Status   Specimen Description BLOOD RIGHT ANTECUBITAL  Final   Special Requests   Final    BOTTLES DRAWN AEROBIC AND ANAEROBIC Blood Culture adequate volume   Culture   Final    NO GROWTH 2 DAYS Performed at [facility], [place] [address] [place], [place] [address]    Report Status PENDING  Incomplete    Today   Subjective    Shane [name] feels much improved since admission.  Feels ready to go home.  Denies chest pain, shortness of breath or abdominal pain.  Feels they can take care of themselves with the resources they have at home.  Objective    Blood pressure 122/81, pulse 69, temperature 98.1 F (36.7 C), resp. rate 16, height 5' 10"  (1.778 m), weight 133.9 kg, SpO2 97 %.   Intake/Output Summary (Last 24 hours) at 02/18/2020 1644 Last data filed at 02/18/2020 0643 Gross per 24 hour  Intake 980.32 ml  Output 1050 ml  Net -69.68 ml    Exam General: Patient appears well and in good spirits sitting up in bed in no acute distress.  Eyes: sclera anicteric, conjuctiva mild injection bilaterally CVS: S1-S2, regular  Respiratory:  decreased air entry bilaterally secondary to decreased inspiratory effort, rales at bases  GI: NABS, soft, NT  LE:  Left lower extremity with marked improvement of erythema although he still has areas of punctate erythema.  No open ulcers.  Trace edema on left. Neuro: A/O x 3, Moving all extremities equally with normal strength, CN 3-12 intact, grossly nonfocal.  Psych: patient is logical and coherent, judgement and insight appear normal, mood and affect appropriate to situation.    Data Review   CBC w Diff:  Lab Results  Component Value Date   WBC 9.8 02/17/2020   HGB 12.7 (L) 02/17/2020   HCT 39.7 02/17/2020   PLT 127 (L) 02/17/2020   LYMPHOPCT 29.7 09/04/2018   MONOPCT 8.8 01/31/2020   EOSPCT 1.0 01/31/2020   BASOPCT 0.8 01/31/2020    CMP:  Lab Results  Component Value Date   NA 138 02/18/2020   K 3.5 02/18/2020   CL 105 02/18/2020   CO2 24 02/18/2020   BUN 12 02/18/2020   CREATININE 0.93 02/18/2020   CREATININE 0.89 01/31/2020   PROT 6.0 (L) 02/17/2020   ALBUMIN 3.0 (  L) 02/17/2020   BILITOT 2.1 (H) 02/17/2020   ALKPHOS 41 02/17/2020   AST 18 02/17/2020   ALT 19 02/17/2020  .   Total Time in preparing paper work, data evaluation and todays exam - 35 minutes  Vashti Hey M.D on 02/18/2020 at 4:44 PM  [facility]   Office  [phone]

## 2020-02-18 NOTE — Progress Notes (Signed)
New order to discharge patient home.  PIV discontinued.  AVS printed and reviewed with patient and spouse.  All questions answered.  Patient asking for a work note.  MD made aware.  Wife will transport patient home when ready.

## 2020-02-21 LAB — CULTURE, BLOOD (ROUTINE X 2)
Culture: NO GROWTH
Culture: NO GROWTH
Special Requests: ADEQUATE
Special Requests: ADEQUATE

## 2020-02-25 ENCOUNTER — Telehealth: Payer: Self-pay | Admitting: Internal Medicine

## 2020-02-25 NOTE — Telephone Encounter (Signed)
Pt is calling in stating that he was given more ABX by the ER and he is not sure why he is getting more.  He has called the ER and not sure what the test results were from the [facility] Fever were.  Pt stated that he was given 2 days worth in IV when in the hospital and was sent home with 4 day worth to take.  Pt stated that he was called by the pharmacy that he had more ABX to pick up and he is not sure what is it for and do not want to take it until someone can tell him what it is for.  Pt is coming in for an appointment on 02/26/2020 at 9:30 for a HFU.  Pt stated that the hospital told him that his PCP can see and tell him what it is for.  Pt would like to have a call before taking it.

## 2020-02-26 ENCOUNTER — Encounter: Payer: Self-pay | Admitting: Internal Medicine

## 2020-02-26 ENCOUNTER — Other Ambulatory Visit: Payer: Self-pay

## 2020-02-26 ENCOUNTER — Ambulatory Visit: Payer: BC Managed Care – PPO | Admitting: Internal Medicine

## 2020-02-26 VITALS — BP 110/78 | HR 74 | Temp 98.2°F | Wt 284.8 lb

## 2020-02-26 DIAGNOSIS — A419 Sepsis, unspecified organism: Secondary | ICD-10-CM | POA: Diagnosis not present

## 2020-02-26 DIAGNOSIS — Z09 Encounter for follow-up examination after completed treatment for conditions other than malignant neoplasm: Secondary | ICD-10-CM | POA: Diagnosis not present

## 2020-02-26 DIAGNOSIS — L03116 Cellulitis of left lower limb: Secondary | ICD-10-CM | POA: Diagnosis not present

## 2020-02-26 NOTE — Progress Notes (Signed)
Established Patient Office Visit     This visit occurred during the SARS-CoV-2 public health emergency.  Safety protocols were in place, including screening questions prior to the visit, additional usage of staff PPE, and extensive cleaning of exam room while observing appropriate contact time as indicated for disinfecting solutions.    CC/Reason for Visit: Hospital follow-up  HPI: Shane [name] is a 54 y.o. male who is coming in today for the above mentioned reasons.  He was hospitalized from 02/15/2020-02/18/2020 after feeling dizziness and weakness.  He was found to be septic due to left lower extremity cellulitis.  He was given sepsis directed fluid boluses as well as IV antibiotics.  He was discharged on 10 days of doxycycline which he has yet to complete.  Redness of his leg has improved, he still has significant bilateral lower extremity swelling.  He has no longer had any fever or pain.  He would like a work note signed as he has not been able to go to work this past week.   Past Medical/Surgical History: Past Medical History:  Diagnosis Date   Chronic knee pain    GERD (gastroesophageal reflux disease)    Left shoulder pain    Obesity     Past Surgical History:  Procedure Laterality Date   carpal pummel repair Bilateral    CERVICAL DISC SURGERY  2003   per Dr. Sherwood Gambler    COLONOSCOPY  05/10/2018   per Dr. Silverio Decamp, internal hemorrhoids and sessile serrated polyps, repeat in 3 yrs    KNEE SURGERY Right     Social History:  reports that he has never smoked. He has never used smokeless tobacco. He reports that he does not drink alcohol and does not use drugs.  Allergies: Allergies  Allergen Reactions   Penicillins Cross Reactors Rash    Family History:  Family History  Problem Relation Age of Onset   Heart disease Mother        mitrial valve repair, CABG    Prostate cancer Father    Hypertension Father    Colon cancer Father        in his  early 44's   Esophageal cancer Neg Hx    Rectal cancer Neg Hx    Stomach cancer Neg Hx      Current Outpatient Medications:    doxycycline (VIBRAMYCIN) 100 MG capsule, Take 1 capsule (100 mg total) by mouth 2 (two) times daily for 8 days., Disp: 16 capsule, Rfl: 0   Vitamin D, Ergocalciferol, (DRISDOL) 1.25 MG (50000 UNIT) CAPS capsule, Take 1 capsule (50,000 Units total) by mouth every 7 (seven) days for 12 doses. (Patient taking differently: Take 50,000 Units by mouth every Sunday. ), Disp: 12 capsule, Rfl: 0  Review of Systems:  Constitutional: Denies fever, chills, diaphoresis, appetite change and fatigue.  HEENT: Denies photophobia, eye pain, redness, hearing loss, ear pain, congestion, sore throat, rhinorrhea, sneezing, mouth sores, trouble swallowing, neck pain, neck stiffness and tinnitus.   Respiratory: Denies SOB, DOE, cough, chest tightness,  and wheezing.   Cardiovascular: Denies chest pain, palpitations. Gastrointestinal: Denies nausea, vomiting, abdominal pain, diarrhea, constipation, blood in stool and abdominal distention.  Genitourinary: Denies dysuria, urgency, frequency, hematuria, flank pain and difficulty urinating.  Endocrine: Denies: hot or cold intolerance, sweats, changes in hair or nails, polyuria, polydipsia. Musculoskeletal: Denies myalgias, back pain, joint swelling, arthralgias and gait problem.  Skin: Denies pallor, rash and wound.  Neurological: Denies dizziness, seizures, syncope, weakness, light-headedness, numbness and  headaches.  Hematological: Denies adenopathy. Easy bruising, personal or family bleeding history  Psychiatric/Behavioral: Denies suicidal ideation, mood changes, confusion, nervousness, sleep disturbance and agitation    Physical Exam: Vitals:   02/26/20 0907  BP: 110/78  Pulse: 74  Temp: 98.2 F (36.8 C)  TempSrc: Oral  SpO2: 95%  Weight: 284 lb 12.8 oz (129.2 kg)    Body mass index is 40.86 kg/m.   Constitutional:  NAD, calm, comfortable, obese Eyes: PERRL, lids and conjunctivae normal ENMT: Mucous membranes are moist.  Respiratory: clear to auscultation bilaterally, no wheezing, no crackles. Normal respiratory effort. No accessory muscle use.  Cardiovascular: Regular rate and rhythm, no murmurs / rubs / gallops.  2+ lower extremity edema, no redness. Neurologic: Grossly intact and nonfocal. Psychiatric: Normal judgment and insight. Alert and oriented x 3. Normal mood.    Impression and Plan:  Hospital discharge follow-up Sepsis without acute organ dysfunction, due to unspecified organism [facility]) Cellulitis of left lower extremity  -Hospital charts have been reviewed in detail. -He still has 2 days of doxycycline to complete, erythema and pain has resolved, however he has significant bilateral lower extremity edema which I suspect is related to copious amounts of IV fluid that he received in the hospital. -Have recommended leg elevation while sitting down, low-salt diet and increased mobilization.  Time spent-35 minutes    Patient Instructions  -Nice seeing you today!!  -Schedule follow up in 6 months.     Lelon Frohlich, MD [facility]

## 2020-02-26 NOTE — Patient Instructions (Signed)
-  Nice seeing you today!!  -Schedule follow up in 6 months. 

## 2020-03-05 LAB — ROCKY MTN SPOTTED FVR ABS PNL(IGG+IGM)
RMSF IgG: POSITIVE — AB
RMSF IgM: 0.38 index (ref 0.00–0.89)

## 2020-03-05 LAB — RMSF, IGG, IFA: RMSF, IGG, IFA: <1:64 {titer}

## 2020-04-03 ENCOUNTER — Ambulatory Visit (INDEPENDENT_AMBULATORY_CARE_PROVIDER_SITE_OTHER): Payer: BC Managed Care – PPO

## 2020-04-03 ENCOUNTER — Other Ambulatory Visit: Payer: Self-pay

## 2020-04-03 DIAGNOSIS — Z23 Encounter for immunization: Secondary | ICD-10-CM | POA: Diagnosis not present

## 2020-04-03 NOTE — Progress Notes (Signed)
Per orders of Dr. Jerilee Hoh, injection of 2nd Shingrix vaccine given by Franco Collet. Patient tolerated injection well.

## 2020-05-08 ENCOUNTER — Other Ambulatory Visit: Payer: Self-pay

## 2020-05-15 ENCOUNTER — Other Ambulatory Visit: Payer: Self-pay

## 2020-05-15 ENCOUNTER — Other Ambulatory Visit: Payer: BC Managed Care – PPO

## 2020-05-15 DIAGNOSIS — E559 Vitamin D deficiency, unspecified: Secondary | ICD-10-CM

## 2020-05-16 LAB — VITAMIN D 25 HYDROXY (VIT D DEFICIENCY, FRACTURES): Vit D, 25-Hydroxy: 58 ng/mL (ref 30–100)

## 2020-05-25 ENCOUNTER — Ambulatory Visit: Payer: BC Managed Care – PPO | Admitting: Family Medicine

## 2020-05-25 ENCOUNTER — Other Ambulatory Visit: Payer: Self-pay

## 2020-05-25 ENCOUNTER — Encounter: Payer: Self-pay | Admitting: Family Medicine

## 2020-05-25 VITALS — BP 140/70 | HR 82 | Ht 70.0 in | Wt 295.0 lb

## 2020-05-25 DIAGNOSIS — L03115 Cellulitis of right lower limb: Secondary | ICD-10-CM | POA: Diagnosis not present

## 2020-05-25 MED ORDER — CEPHALEXIN 500 MG PO CAPS
500.0000 mg | ORAL_CAPSULE | Freq: Four times a day (QID) | ORAL | 0 refills | Status: DC
Start: 2020-05-25 — End: 2020-09-16

## 2020-05-25 NOTE — Patient Instructions (Signed)

## 2020-05-25 NOTE — Progress Notes (Signed)
Established Patient Office Visit  Subjective:  Patient ID: Shane [name], male    DOB: 13-Jul-1965  Age: 54 y.o. MRN: [id number]  CC:  Chief Complaint  Patient presents with  . Leg Problem    HPI DORRANCE SELLICK presents for swelling and pain left lower extremity.  He was admitted back in August for what sounds like left lower extremity cellulitis.  He noted last Thursday some increased swelling of the right leg and then started having some soreness similar to prior cellulitis over the weekend.  No fevers or chills.  He does have a couple of mild excoriations on the right lower extremity.  He has reported allergy to "penicillins cross reactors ".  However, he did receive cephalosporins during his hospitalization without any difficulty.  No known history of MRSA.  No nausea or vomiting.  Denies any history of diabetes.  Past Medical History:  Diagnosis Date  . Chronic knee pain   . GERD (gastroesophageal reflux disease)   . Left shoulder pain   . Obesity     Past Surgical History:  Procedure Laterality Date  . carpal pummel repair Bilateral   . CERVICAL DISC SURGERY  2003   per Dr. Sherwood Gambler   . COLONOSCOPY  05/10/2018   per Dr. Silverio Decamp, internal hemorrhoids and sessile serrated polyps, repeat in 3 yrs   . KNEE SURGERY Right     Family History  Problem Relation Age of Onset  . Heart disease Mother        mitrial valve repair, CABG   . Prostate cancer Father   . Hypertension Father   . Colon cancer Father        in his early 35's  . Esophageal cancer Neg Hx   . Rectal cancer Neg Hx   . Stomach cancer Neg Hx     Social History   Socioeconomic History  . Marital status: Married    Spouse name: Not on file  . Number of children: 3  . Years of education: Not on file  . Highest education level: Not on file  Occupational History  . Occupation: [employer]   Tobacco Use  . Smoking status: Never Smoker  . Smokeless tobacco: Never Used  Vaping Use  . Vaping  Use: Never used  Substance and Sexual Activity  . Alcohol use: No    Alcohol/week: 0.0 standard drinks  . Drug use: No  . Sexual activity: Not on file  Other Topics Concern  . Not on file  Social History Narrative  . Not on file   Social Determinants of Health   Financial Resource Strain:   . Difficulty of Paying Living Expenses: Not on file  Food Insecurity:   . Worried About Charity fundraiser in the Last Year: Not on file  . Ran Out of Food in the Last Year: Not on file  Transportation Needs:   . Lack of Transportation (Medical): Not on file  . Lack of Transportation (Non-Medical): Not on file  Physical Activity:   . Days of Exercise per Week: Not on file  . Minutes of Exercise per Session: Not on file  Stress:   . Feeling of Stress : Not on file  Social Connections:   . Frequency of Communication with Friends and Family: Not on file  . Frequency of Social Gatherings with Friends and Family: Not on file  . Attends Religious Services: Not on file  . Active Member of Clubs or Organizations: Not on file  .  Attends Archivist Meetings: Not on file  . Marital Status: Not on file  Intimate Partner Violence:   . Fear of Current or Ex-Partner: Not on file  . Emotionally Abused: Not on file  . Physically Abused: Not on file  . Sexually Abused: Not on file    No outpatient medications prior to visit.   No facility-administered medications prior to visit.    Allergies  Allergen Reactions  . Penicillins Cross Reactors Rash    ROS Review of Systems  Constitutional: Negative for chills and fever.  Respiratory: Negative for shortness of breath.   Cardiovascular: Negative for chest pain.      Objective:    Physical Exam Cardiovascular:     Rate and Rhythm: Normal rate and regular rhythm.  Pulmonary:     Effort: Pulmonary effort is normal.     Breath sounds: Normal breath sounds.  Musculoskeletal:     Comments: He has large legs bilaterally but right is  slightly larger than left.  He has some mild warmth and mild erythema along the medial and posterior aspect of the right leg.  He has a couple of superficial excoriations anteriorly but no other open wounds noted.  Minimal pitting edema lower legs bilaterally  Neurological:     Mental Status: He is alert.     BP 140/70   Pulse 82   Ht 5\' 10"  (1.778 m)   Wt 295 lb (133.8 kg)   SpO2 98%   BMI 42.33 kg/m  Wt Readings from Last 3 Encounters:  05/25/20 295 lb (133.8 kg)  02/26/20 284 lb 12.8 oz (129.2 kg)  02/16/20 295 lb 3.1 oz (133.9 kg)     Health Maintenance Due  Topic Date Due  . Hepatitis C Screening  Never done  . COVID-19 Vaccine (1) Never done    There are no preventive care reminders to display for this patient.  Lab Results  Component Value Date   TSH 0.527 02/16/2020   Lab Results  Component Value Date   WBC 9.8 02/17/2020   HGB 12.7 (L) 02/17/2020   HCT 39.7 02/17/2020   MCV 90.8 02/17/2020   PLT 127 (L) 02/17/2020   Lab Results  Component Value Date   NA 138 02/18/2020   K 3.5 02/18/2020   CO2 24 02/18/2020   GLUCOSE 146 (H) 02/18/2020   BUN 12 02/18/2020   CREATININE 0.93 02/18/2020   BILITOT 2.1 (H) 02/17/2020   ALKPHOS 41 02/17/2020   AST 18 02/17/2020   ALT 19 02/17/2020   PROT 6.0 (L) 02/17/2020   ALBUMIN 3.0 (L) 02/17/2020   CALCIUM 8.3 (L) 02/18/2020   ANIONGAP 9 02/18/2020   GFR 98.66 09/04/2018   Lab Results  Component Value Date   CHOL 182 01/31/2020   Lab Results  Component Value Date   HDL 40 01/31/2020   Lab Results  Component Value Date   LDLCALC 120 (H) 01/31/2020   Lab Results  Component Value Date   TRIG 109 01/31/2020   Lab Results  Component Value Date   CHOLHDL 4.6 01/31/2020   Lab Results  Component Value Date   HGBA1C 5.3 01/31/2020      Assessment & Plan:   Cellulitis involving right leg.  Past history of cellulitis requiring hospitalization several months ago.  Patient nontoxic in  appearance  -Start cephalexin 500 mg 4 times daily for 10 days -Recommend frequent leg elevation -His job is fairly physical and requires a lot of climbing and being on  his feet we wrote for him to be out through 05/29/20 -Follow-up promptly for any systemic fever, vomiting, weakness, or other concerns  Meds ordered this encounter  Medications  . cephALEXin (KEFLEX) 500 MG capsule    Sig: Take 1 capsule (500 mg total) by mouth 4 (four) times daily.    Dispense:  40 capsule    Refill:  0    Follow-up: No follow-ups on file.    Carolann Littler, MD

## 2020-05-29 ENCOUNTER — Telehealth: Payer: Self-pay | Admitting: Internal Medicine

## 2020-05-29 NOTE — Telephone Encounter (Signed)
I spoke with pt. He will call back Monday to schedule appt if he isn't any better.

## 2020-05-29 NOTE — Telephone Encounter (Signed)
If not further improved by next week, needs to see Dr Jerilee Hoh in follow up.  I think he will hopefully be OK to go back in Monday.  Keep elevated frequently over the weekend.

## 2020-05-29 NOTE — Telephone Encounter (Signed)
Pt call and stated his leg is still swollen and some red but want to know if he should go back to work Monday or what do he need to do.pt want a call back.

## 2020-06-02 ENCOUNTER — Other Ambulatory Visit: Payer: Self-pay

## 2020-06-02 ENCOUNTER — Encounter: Payer: Self-pay | Admitting: Family Medicine

## 2020-06-02 ENCOUNTER — Ambulatory Visit: Payer: BC Managed Care – PPO | Admitting: Family Medicine

## 2020-06-02 VITALS — BP 140/80 | HR 71 | Temp 98.1°F | Wt 295.4 lb

## 2020-06-02 DIAGNOSIS — R600 Localized edema: Secondary | ICD-10-CM

## 2020-06-02 DIAGNOSIS — L03115 Cellulitis of right lower limb: Secondary | ICD-10-CM

## 2020-06-02 NOTE — Progress Notes (Signed)
Established Patient Office Visit  Subjective:  Patient ID: Shane [name], male    DOB: 03-24-1966  Age: 54 y.o. MRN: [id number]  CC:  Chief Complaint  Patient presents with  . Edema    right leg and ankle    HPI Shane [name] presents for follow-up right leg and foot swelling and pain.  He states he has had large legs and ankles for several years but around July of last years when he noticed some asymmetric edema.  At that point he went to the ER and was treated for cellulitis.  He was seen here recently and had some increased erythema in the right leg and warmth and presumed recurrent cellulitis.  We started him on Keflex.  He has shown some signs of improvement.  No warmth or erythema no fever.  However, he continues to have some asymmetric leg edema and right calf pain.  He states this has been since July.  No prior history of DVT.  No history of prior Doppler.  No dyspnea or chest pain.  Currently takes no regular medications.  Past Medical History:  Diagnosis Date  . Chronic knee pain   . GERD (gastroesophageal reflux disease)   . Left shoulder pain   . Obesity     Past Surgical History:  Procedure Laterality Date  . carpal pummel repair Bilateral   . CERVICAL DISC SURGERY  2003   per Dr. Sherwood Gambler   . COLONOSCOPY  05/10/2018   per Dr. Silverio Decamp, internal hemorrhoids and sessile serrated polyps, repeat in 3 yrs   . KNEE SURGERY Right     Family History  Problem Relation Age of Onset  . Heart disease Mother        mitrial valve repair, CABG   . Prostate cancer Father   . Hypertension Father   . Colon cancer Father        in his early 24's  . Esophageal cancer Neg Hx   . Rectal cancer Neg Hx   . Stomach cancer Neg Hx     Social History   Socioeconomic History  . Marital status: Married    Spouse name: Not on file  . Number of children: 3  . Years of education: Not on file  . Highest education level: Not on file  Occupational History  . Occupation:  [employer]   Tobacco Use  . Smoking status: Never Smoker  . Smokeless tobacco: Never Used  Vaping Use  . Vaping Use: Never used  Substance and Sexual Activity  . Alcohol use: No    Alcohol/week: 0.0 standard drinks  . Drug use: No  . Sexual activity: Not on file  Other Topics Concern  . Not on file  Social History Narrative  . Not on file   Social Determinants of Health   Financial Resource Strain:   . Difficulty of Paying Living Expenses: Not on file  Food Insecurity:   . Worried About Charity fundraiser in the Last Year: Not on file  . Ran Out of Food in the Last Year: Not on file  Transportation Needs:   . Lack of Transportation (Medical): Not on file  . Lack of Transportation (Non-Medical): Not on file  Physical Activity:   . Days of Exercise per Week: Not on file  . Minutes of Exercise per Session: Not on file  Stress:   . Feeling of Stress : Not on file  Social Connections:   . Frequency of Communication with Friends and  Family: Not on file  . Frequency of Social Gatherings with Friends and Family: Not on file  . Attends Religious Services: Not on file  . Active Member of Clubs or Organizations: Not on file  . Attends Archivist Meetings: Not on file  . Marital Status: Not on file  Intimate Partner Violence:   . Fear of Current or Ex-Partner: Not on file  . Emotionally Abused: Not on file  . Physically Abused: Not on file  . Sexually Abused: Not on file    Outpatient Medications Prior to Visit  Medication Sig Dispense Refill  . cephALEXin (KEFLEX) 500 MG capsule Take 1 capsule (500 mg total) by mouth 4 (four) times daily. 40 capsule 0   No facility-administered medications prior to visit.    Allergies  Allergen Reactions  . Penicillins Cross Reactors Rash    ROS Review of Systems  Constitutional: Negative for chills and fever.  Respiratory: Negative for cough and shortness of breath.   Cardiovascular: Positive for leg swelling.  Negative for chest pain and palpitations.  Neurological: Negative for weakness.      Objective:    Physical Exam Vitals reviewed.  Constitutional:      Appearance: Normal appearance.  Cardiovascular:     Rate and Rhythm: Normal rate and regular rhythm.  Pulmonary:     Effort: Pulmonary effort is normal.     Breath sounds: Normal breath sounds.  Musculoskeletal:     Comments: Both legs are very large and fairly tense to palpation.  There is no pitting edema.  He does have some nonpitting edema involving the lower legs, feet and ankles bilaterally but definitely worse on the right side.  He has some mild right calf tenderness  Skin:    Comments: Previously noted cellulitis changes of erythema and warmth have resolved.  No ulcerations on the leg  Neurological:     Mental Status: He is alert.     BP 140/80 (BP Location: Left Arm, Patient Position: Sitting, Cuff Size: Large)   Pulse 71   Temp 98.1 F (36.7 C) (Oral)   Wt 295 lb 6.4 oz (134 kg)   SpO2 95%   BMI 42.39 kg/m  Wt Readings from Last 3 Encounters:  06/02/20 295 lb 6.4 oz (134 kg)  05/25/20 295 lb (133.8 kg)  02/26/20 284 lb 12.8 oz (129.2 kg)     Health Maintenance Due  Topic Date Due  . Hepatitis C Screening  Never done    There are no preventive care reminders to display for this patient.  Lab Results  Component Value Date   TSH 0.527 02/16/2020   Lab Results  Component Value Date   WBC 9.8 02/17/2020   HGB 12.7 (L) 02/17/2020   HCT 39.7 02/17/2020   MCV 90.8 02/17/2020   PLT 127 (L) 02/17/2020   Lab Results  Component Value Date   NA 138 02/18/2020   K 3.5 02/18/2020   CO2 24 02/18/2020   GLUCOSE 146 (H) 02/18/2020   BUN 12 02/18/2020   CREATININE 0.93 02/18/2020   BILITOT 2.1 (H) 02/17/2020   ALKPHOS 41 02/17/2020   AST 18 02/17/2020   ALT 19 02/17/2020   PROT 6.0 (L) 02/17/2020   ALBUMIN 3.0 (L) 02/17/2020   CALCIUM 8.3 (L) 02/18/2020   ANIONGAP 9 02/18/2020   GFR 98.66 09/04/2018     Lab Results  Component Value Date   CHOL 182 01/31/2020   Lab Results  Component Value Date   HDL 40  01/31/2020   Lab Results  Component Value Date   LDLCALC 120 (H) 01/31/2020   Lab Results  Component Value Date   TRIG 109 01/31/2020   Lab Results  Component Value Date   CHOLHDL 4.6 01/31/2020   Lab Results  Component Value Date   HGBA1C 5.3 01/31/2020      Assessment & Plan:   #1 recent cellulitis changes right leg resolving on Keflex  #2 several month history of asymmetric edema right leg greater than left.  Suspect largely asymmetric venous stasis.  -Set up Dopplers to rule out DVT -Recommend frequent leg elevation -Discussed importance of compression.  He will consider starting with some nonprescription knee-high compression socks -Recommend follow-up with primary to discuss other possible treatment options including possibly low dose diuretics for persistent edema if Doppler is negative  No orders of the defined types were placed in this encounter.   Follow-up: No follow-ups on file.    Carolann Littler, MD

## 2020-06-02 NOTE — Patient Instructions (Signed)
We are setting up venous doppler to rule out deep vein thrombosis.    Consider knee high compression socks

## 2020-06-03 ENCOUNTER — Ambulatory Visit (HOSPITAL_COMMUNITY)
Admission: RE | Admit: 2020-06-03 | Discharge: 2020-06-03 | Disposition: A | Discharge status: Home / Self Care | Payer: BC Managed Care – PPO | Source: Ambulatory Visit | Attending: Cardiovascular Disease | Admitting: Cardiovascular Disease

## 2020-06-03 DIAGNOSIS — R600 Localized edema: Secondary | ICD-10-CM

## 2020-06-09 NOTE — Progress Notes (Signed)
Venous doppler shows no DVT.

## 2020-08-07 ENCOUNTER — Other Ambulatory Visit: Payer: Self-pay | Admitting: Internal Medicine

## 2020-08-07 ENCOUNTER — Ambulatory Visit: Payer: BC Managed Care – PPO | Admitting: Internal Medicine

## 2020-08-07 ENCOUNTER — Other Ambulatory Visit: Payer: Self-pay

## 2020-08-07 ENCOUNTER — Other Ambulatory Visit (INDEPENDENT_AMBULATORY_CARE_PROVIDER_SITE_OTHER): Payer: BC Managed Care – PPO

## 2020-08-07 DIAGNOSIS — E785 Hyperlipidemia, unspecified: Secondary | ICD-10-CM

## 2020-08-07 LAB — LIPID PANEL
Cholesterol: 154 mg/dL (ref 0–200)
HDL: 33.2 mg/dL — ABNORMAL LOW (ref >39.00)
LDL Cholesterol: 102 mg/dL — ABNORMAL HIGH (ref 0–99)
NonHDL: 120.33
Total CHOL/HDL Ratio: 5
Triglycerides: 92 mg/dL (ref 0.0–149.0)
VLDL: 18.4 mg/dL (ref 0.0–40.0)

## 2020-09-16 ENCOUNTER — Encounter: Payer: Self-pay | Admitting: Internal Medicine

## 2020-09-16 ENCOUNTER — Ambulatory Visit: Payer: BC Managed Care – PPO | Admitting: Internal Medicine

## 2020-09-16 ENCOUNTER — Other Ambulatory Visit: Payer: Self-pay

## 2020-09-16 VITALS — BP 130/80 | HR 100 | Temp 100.6°F | Wt 296.8 lb

## 2020-09-16 DIAGNOSIS — N50811 Right testicular pain: Secondary | ICD-10-CM | POA: Diagnosis not present

## 2020-09-16 DIAGNOSIS — R319 Hematuria, unspecified: Secondary | ICD-10-CM | POA: Diagnosis not present

## 2020-09-16 LAB — POCT URINALYSIS DIPSTICK
Bilirubin, UA: NEGATIVE
Glucose, UA: NEGATIVE
Ketones, UA: NEGATIVE
Leukocytes, UA: NEGATIVE
Nitrite, UA: NEGATIVE
Protein, UA: NEGATIVE
Spec Grav, UA: 1.02 (ref 1.010–1.025)
Urobilinogen, UA: 0.2 E.U./dL
pH, UA: 8 (ref 5.0–8.0)

## 2020-09-16 LAB — URINALYSIS
Bilirubin Urine: NEGATIVE
Ketones, ur: NEGATIVE
Leukocytes,Ua: NEGATIVE
Nitrite: NEGATIVE
Specific Gravity, Urine: 1.02 (ref 1.000–1.030)
Total Protein, Urine: NEGATIVE
Urine Glucose: NEGATIVE
Urobilinogen, UA: 1 (ref 0.0–1.0)
pH: 8 (ref 5.0–8.0)

## 2020-09-16 MED ORDER — SULFAMETHOXAZOLE-TRIMETHOPRIM 800-160 MG PO TABS: 1.0000 | ORAL_TABLET | Freq: Two times a day (BID) | ORAL | 0 refills | Status: AC

## 2020-09-16 NOTE — Progress Notes (Signed)
Acute office Visit     This visit occurred during the SARS-CoV-2 public health emergency.  Safety protocols were in place, including screening questions prior to the visit, additional usage of staff PPE, and extensive cleaning of exam room while observing appropriate contact time as indicated for disinfecting solutions.    CC/Reason for Visit: Right testicular pain  HPI: Shane [name] is a 55 y.o. male who is coming in today for the above mentioned reasons.  He was in his usual state of health this morning.  Abruptly at around 1030 to 11:00 he started experiencing significant right testicular pain.  He has felt hot.  For the past week he has had increased urinary frequency and dark-colored urine.  He has a temperature of 100.6 today.  He has no URI symptoms, no body aches.  Past Medical/Surgical History: Past Medical History:  Diagnosis Date  . Chronic knee pain   . GERD (gastroesophageal reflux disease)   . Left shoulder pain   . Obesity     Past Surgical History:  Procedure Laterality Date  . carpal pummel repair Bilateral   . CERVICAL DISC SURGERY  2003   per Dr. Sherwood Gambler   . COLONOSCOPY  05/10/2018   per Dr. Silverio Decamp, internal hemorrhoids and sessile serrated polyps, repeat in 3 yrs   . KNEE SURGERY Right     Social History:  reports that he has never smoked. He has never used smokeless tobacco. He reports that he does not drink alcohol and does not use drugs.  Allergies: Allergies  Allergen Reactions  . Penicillins Cross Reactors Rash    Family History:  Family History  Problem Relation Age of Onset  . Heart disease Mother        mitrial valve repair, CABG   . Prostate cancer Father   . Hypertension Father   . Colon cancer Father        in his early 19's  . Esophageal cancer Neg Hx   . Rectal cancer Neg Hx   . Stomach cancer Neg Hx      Current Outpatient Medications:  .  sulfamethoxazole-trimethoprim (BACTRIM DS) 800-160 MG tablet, Take 1  tablet by mouth 2 (two) times daily for 7 days., Disp: 14 tablet, Rfl: 0  Review of Systems:  Constitutional: Denies fever, chills, diaphoresis, appetite change and fatigue.  HEENT: Denies photophobia, eye pain, redness, hearing loss, ear pain, congestion, sore throat, rhinorrhea, sneezing, mouth sores, trouble swallowing, neck pain, neck stiffness and tinnitus.   Respiratory: Denies SOB, DOE, cough, chest tightness,  and wheezing.   Cardiovascular: Denies chest pain, palpitations and leg swelling.  Gastrointestinal: Denies nausea, vomiting, abdominal pain, diarrhea, constipation, blood in stool and abdominal distention.  Genitourinary: Denies dysuria, difficulty urinating.  Endocrine: Denies: hot or cold intolerance, sweats, changes in hair or nails, polyuria, polydipsia. Musculoskeletal: Denies myalgias, back pain, joint swelling, arthralgias and gait problem.  Skin: Denies pallor, rash and wound.  Neurological: Denies dizziness, seizures, syncope, weakness, light-headedness, numbness and headaches.  Hematological: Denies adenopathy. Easy bruising, personal or family bleeding history  Psychiatric/Behavioral: Denies suicidal ideation, mood changes, confusion, nervousness, sleep disturbance and agitation    Physical Exam: Vitals:   09/16/20 1413  BP: 130/80  Pulse: 100  Temp: (!) 100.6 F (38.1 C)  TempSrc: Oral  SpO2: 93%  Weight: 296 lb 12.8 oz (134.6 kg)    Body mass index is 42.59 kg/m.   Constitutional: NAD, calm, comfortable, obese Eyes: PERRL, lids and conjunctivae normal ENMT:  Mucous membranes are moist.  Abdomen: no tenderness, no masses palpated. No hepatosplenomegaly. Bowel sounds positive.  Right testicle is lifted as compared to the left, no obvious erythema or edema. Neurologic: Grossly intact and nonfocal. Psychiatric: Normal judgment and insight. Alert and oriented x 3. Normal mood.    Impression and Plan:  Testicular pain, right Hematuria, unspecified  type   -Biggest concern at the moment with acute right testicular pain and lifting of the right testicle would be testicular torsion. STAT Ultrasound of the scrotum with Doppler has been ordered. -Also I believe kidney stones/ureteral lithiasis is a possibility especially with hematuria. -Urine dipstick in office today is very positive for blood. -As he has an ongoing temperature I will prescribe Bactrim double strength for him to take for 7 days, I will also send him for a renal ultrasound to rule out obstruction or hydronephrosis given his significant right testicular and right suprapubic pain.    Patient Instructions  -Nice seeing you today!!  -[place] to be done today.  -Start Bactrim DS 1 tablet twice daily for 7 days.     Lelon Frohlich, MD [facility]

## 2020-09-16 NOTE — Patient Instructions (Signed)
-  Nice seeing you today!!  -[place] to be done today.  -Start Bactrim DS 1 tablet twice daily for 7 days.

## 2020-09-17 ENCOUNTER — Ambulatory Visit (HOSPITAL_COMMUNITY)
Admission: RE | Admit: 2020-09-17 | Discharge: 2020-09-17 | Disposition: A | Discharge status: Home / Self Care | Payer: BC Managed Care – PPO | Source: Ambulatory Visit | Attending: Internal Medicine | Admitting: Internal Medicine

## 2020-09-17 DIAGNOSIS — I861 Scrotal varices: Secondary | ICD-10-CM | POA: Diagnosis not present

## 2020-09-17 DIAGNOSIS — N50811 Right testicular pain: Secondary | ICD-10-CM

## 2020-09-17 DIAGNOSIS — N281 Cyst of kidney, acquired: Secondary | ICD-10-CM | POA: Diagnosis not present

## 2020-09-17 DIAGNOSIS — R319 Hematuria, unspecified: Secondary | ICD-10-CM | POA: Insufficient documentation

## 2020-09-17 DIAGNOSIS — N503 Cyst of epididymis: Secondary | ICD-10-CM | POA: Diagnosis not present

## 2020-09-17 DIAGNOSIS — N4342 Spermatocele of epididymis, multiple: Secondary | ICD-10-CM | POA: Diagnosis not present

## 2020-09-17 DIAGNOSIS — N433 Hydrocele, unspecified: Secondary | ICD-10-CM | POA: Diagnosis not present

## 2020-09-18 LAB — URINE CULTURE
MICRO NUMBER:: 11627863
Result:: NO GROWTH
SPECIMEN QUALITY:: ADEQUATE

## 2021-02-04 ENCOUNTER — Other Ambulatory Visit: Payer: Self-pay

## 2021-02-04 ENCOUNTER — Other Ambulatory Visit: Payer: Self-pay | Admitting: Internal Medicine

## 2021-02-04 ENCOUNTER — Other Ambulatory Visit (INDEPENDENT_AMBULATORY_CARE_PROVIDER_SITE_OTHER): Payer: BC Managed Care – PPO

## 2021-02-04 DIAGNOSIS — E785 Hyperlipidemia, unspecified: Secondary | ICD-10-CM

## 2021-02-04 DIAGNOSIS — E782 Mixed hyperlipidemia: Secondary | ICD-10-CM

## 2021-02-04 LAB — LIPID PANEL
Cholesterol: 165 mg/dL (ref 0–200)
HDL: 31.9 mg/dL — ABNORMAL LOW (ref >39.00)
LDL Cholesterol: 111 mg/dL — ABNORMAL HIGH (ref 0–99)
NonHDL: 133.21
Total CHOL/HDL Ratio: 5
Triglycerides: 112 mg/dL (ref 0.0–149.0)
VLDL: 22.4 mg/dL (ref 0.0–40.0)

## 2021-02-04 MED ORDER — ATORVASTATIN CALCIUM 20 MG PO TABS: 20.0000 mg | ORAL_TABLET | Freq: Every day | ORAL | 1 refills | Status: DC

## 2021-02-05 ENCOUNTER — Other Ambulatory Visit: Payer: Self-pay | Admitting: Internal Medicine

## 2021-02-05 DIAGNOSIS — E782 Mixed hyperlipidemia: Secondary | ICD-10-CM

## 2021-03-01 ENCOUNTER — Other Ambulatory Visit: Payer: Self-pay

## 2021-03-01 ENCOUNTER — Encounter: Payer: Self-pay | Admitting: Family Medicine

## 2021-03-01 ENCOUNTER — Telehealth: Payer: Self-pay | Admitting: Internal Medicine

## 2021-03-01 ENCOUNTER — Ambulatory Visit: Payer: BC Managed Care – PPO | Admitting: Family Medicine

## 2021-03-01 VITALS — BP 156/80 | HR 77 | Temp 98.3°F | Wt 302.8 lb

## 2021-03-01 DIAGNOSIS — M542 Cervicalgia: Secondary | ICD-10-CM | POA: Diagnosis not present

## 2021-03-01 DIAGNOSIS — Z9889 Other specified postprocedural states: Secondary | ICD-10-CM | POA: Diagnosis not present

## 2021-03-01 MED ORDER — HYDROCODONE-ACETAMINOPHEN 5-325 MG PO TABS: 1.0000 | ORAL_TABLET | Freq: Four times a day (QID) | ORAL | 0 refills | Status: DC | PRN

## 2021-03-01 MED ORDER — PREDNISONE 10 MG PO TABS: ORAL_TABLET | ORAL | 0 refills | Status: DC

## 2021-03-01 NOTE — Telephone Encounter (Signed)
Please advise.  Assuming directions are take 6 tablets on day one, six tablets on day two, four tablets on day three and so on. Is this correct?

## 2021-03-01 NOTE — Progress Notes (Signed)
Established Patient Office Visit  Subjective:  Patient ID: Shane [name], male    DOB: 21-Aug-1965  Age: 55 y.o. MRN: [id number]  CC:  Chief Complaint  Patient presents with   Neck Pain    Started Thursday, neck was stiff, got worse through the day, very painful to move,     HPI Shane [name] presents for sharp left-sided neck pain which started last Thursday.  He woke up with this.  Denies any recent injury.  Has had prior anterior fusion C5-6.  Pain was especially severe over the weekend.  He tried Tylenol without relief.  He got some mild relief with high-dose ibuprofen.  He noticed some bilateral hand numbness intermittently.  No weakness.  Pain is slightly improved today.  He has sharp pain if he turns his head to the right or left side.  Last MRI on record was from 2016.  This showed anterior fusion C5-6.  He had some central canal stenosis C4-5 and C6-7 with bilateral foraminal narrowing C4-5 and right foraminal narrowing C3-4.  Past Medical History:  Diagnosis Date   Chronic knee pain    GERD (gastroesophageal reflux disease)    Left shoulder pain    Obesity     Past Surgical History:  Procedure Laterality Date   carpal pummel repair Bilateral    CERVICAL [place] SURGERY  2003   per Dr. Sherwood Gambler    COLONOSCOPY  05/10/2018   per Dr. Silverio Decamp, internal hemorrhoids and sessile serrated polyps, repeat in 3 yrs    KNEE SURGERY Right     Family History  Problem Relation Age of Onset   Heart disease Mother        mitrial valve repair, CABG    Prostate cancer Father    Hypertension Father    Colon cancer Father        in his early 46's   Esophageal cancer Neg Hx    Rectal cancer Neg Hx    Stomach cancer Neg Hx     Social History   Socioeconomic History   Marital status: Married    Spouse name: Not on file   Number of children: 3   Years of education: Not on file   Highest education level: Not on file  Occupational History   Occupation: [employer]    Tobacco Use   Smoking status: Never   Smokeless tobacco: Never  Vaping Use   Vaping Use: Never used  Substance and Sexual Activity   Alcohol use: No    Alcohol/week: 0.0 standard drinks   Drug use: No   Sexual activity: Not on file  Other Topics Concern   Not on file  Social History Narrative   Not on file   Social Determinants of Health   Financial Resource Strain: Not on file  Food Insecurity: Not on file  Transportation Needs: Not on file  Physical Activity: Not on file  Stress: Not on file  Social Connections: Not on file  Intimate Partner Violence: Not on file    Outpatient Medications Prior to Visit  Medication Sig Dispense Refill   atorvastatin (LIPITOR) 20 MG tablet Take 1 tablet (20 mg total) by mouth daily. 90 tablet 1   No facility-administered medications prior to visit.    Allergies  Allergen Reactions   Penicillins Cross Reactors Rash    ROS Review of Systems  Constitutional:  Negative for chills and fever.  Respiratory:  Negative for shortness of breath.   Cardiovascular:  Negative for chest  pain.  Musculoskeletal:  Positive for neck pain.  Neurological:  Positive for numbness. Negative for weakness.     Objective:    Physical Exam Vitals reviewed.  Constitutional:      Appearance: Normal appearance.  Neck:     Comments: Has pain with rotation or lateral bending especially to the right side. Cardiovascular:     Rate and Rhythm: Normal rate and regular rhythm.  Pulmonary:     Effort: Pulmonary effort is normal.     Breath sounds: Normal breath sounds.  Neurological:     Mental Status: He is alert.     Comments: Trace deep tendon reflexes upper extremities bilaterally.  No focal weakness.  Normal sensory function to touch.    BP (!) 156/80 (BP Location: Left Arm, Patient Position: Sitting, Cuff Size: Normal)   Pulse 77   Temp 98.3 F (36.8 C) (Oral)   Wt (!) 302 lb 12.8 oz (137.3 kg)   SpO2 98%   BMI 43.45 kg/m  Wt Readings from  Last 3 Encounters:  03/01/21 (!) 302 lb 12.8 oz (137.3 kg)  09/16/20 296 lb 12.8 oz (134.6 kg)  06/02/20 295 lb 6.4 oz (134 kg)     Health Maintenance Due  Topic Date Due   COVID-19 Vaccine (1) Never done   Hepatitis C Screening  Never done   INFLUENZA VACCINE  02/08/2021    There are no preventive care reminders to display for this patient.  Lab Results  Component Value Date   TSH 0.527 02/16/2020   Lab Results  Component Value Date   WBC 9.8 02/17/2020   HGB 12.7 (L) 02/17/2020   HCT 39.7 02/17/2020   MCV 90.8 02/17/2020   PLT 127 (L) 02/17/2020   Lab Results  Component Value Date   NA 138 02/18/2020   K 3.5 02/18/2020   CO2 24 02/18/2020   GLUCOSE 146 (H) 02/18/2020   BUN 12 02/18/2020   CREATININE 0.93 02/18/2020   BILITOT 2.1 (H) 02/17/2020   ALKPHOS 41 02/17/2020   AST 18 02/17/2020   ALT 19 02/17/2020   PROT 6.0 (L) 02/17/2020   ALBUMIN 3.0 (L) 02/17/2020   CALCIUM 8.3 (L) 02/18/2020   ANIONGAP 9 02/18/2020   GFR 98.66 09/04/2018   Lab Results  Component Value Date   CHOL 165 02/04/2021   Lab Results  Component Value Date   HDL 31.90 (L) 02/04/2021   Lab Results  Component Value Date   LDLCALC 111 (H) 02/04/2021   Lab Results  Component Value Date   TRIG 112.0 02/04/2021   Lab Results  Component Value Date   CHOLHDL 5 02/04/2021   Lab Results  Component Value Date   HGBA1C 5.3 01/31/2020      Assessment & Plan:   Acute left-sided neck pain.  Suspect cervical nerve root impingement.  Has had prior surgery as above.  No weakness at this time.  Pain is improved slightly today but still has considerable pain with minimal movement.  -Recommend prednisone taper over 10 days.  Reviewed potential side effects -Wrote for limited Vicodin 5/325 mg 1-2 every 4-6 hours as needed for severe pain. -We recommend if pain not improving over the next week or 2 to be in touch.  Low threshold to get follow-up cervical MRI if not improving given his  surgical history.  Meds ordered this encounter  Medications   predniSONE (DELTASONE) 10 MG tablet    Sig: Taper as follows: 6-6-4-4-3-3-2-2-1-1    Dispense:  32 tablet  Refill:  0   HYDROcodone-acetaminophen (NORCO/VICODIN) 5-325 MG tablet    Sig: Take 1 tablet by mouth every 6 (six) hours as needed for moderate pain.    Dispense:  15 tablet    Refill:  0    Follow-up: No follow-ups on file.    Carolann Littler, MD

## 2021-03-01 NOTE — Telephone Encounter (Signed)
Spoke with [place]. Prescription information has been clarified. Nothing further needed.

## 2021-03-01 NOTE — Telephone Encounter (Signed)
Sabrina from the pharmacy needs clarification on the direction of the  predniSONE (DELTASONE) 10 MG tablet Taper as follows: 6-6-4-4-3-3-2-2-1-1, Normal Dispense: 32 tablet Refills: 0 ordered Pharmacy: [facility] [address], [place] [address] (Ph: [phone]) Order Details Ordered on: 03/01/21 Authorizing provider: Eulas Post, MD

## 2021-03-05 ENCOUNTER — Telehealth: Payer: Self-pay | Admitting: Internal Medicine

## 2021-03-05 NOTE — Telephone Encounter (Signed)
Spoke with the patient. He is aware of Dr. Erick Blinks message. Nothing further needed.

## 2021-03-05 NOTE — Telephone Encounter (Signed)
Pt call with a report for dr.Burchette want him to know that the pain in his neck is not gone but not as bad and his hands are still numb and want to know what is next.pt stated he will go back to work if he don't here from dr.Burchette

## 2021-03-05 NOTE — Telephone Encounter (Signed)
Left message for patient to call back  

## 2021-05-18 ENCOUNTER — Encounter: Payer: Self-pay | Admitting: Gastroenterology

## 2021-07-10 IMAGING — DX DG CHEST 1V PORT
1 series · 1 of 1 positions shown · non-contrast
Comparison: None.

CLINICAL DATA: Shortness of breath

EXAM:
PORTABLE CHEST 1 VIEW

[chest]
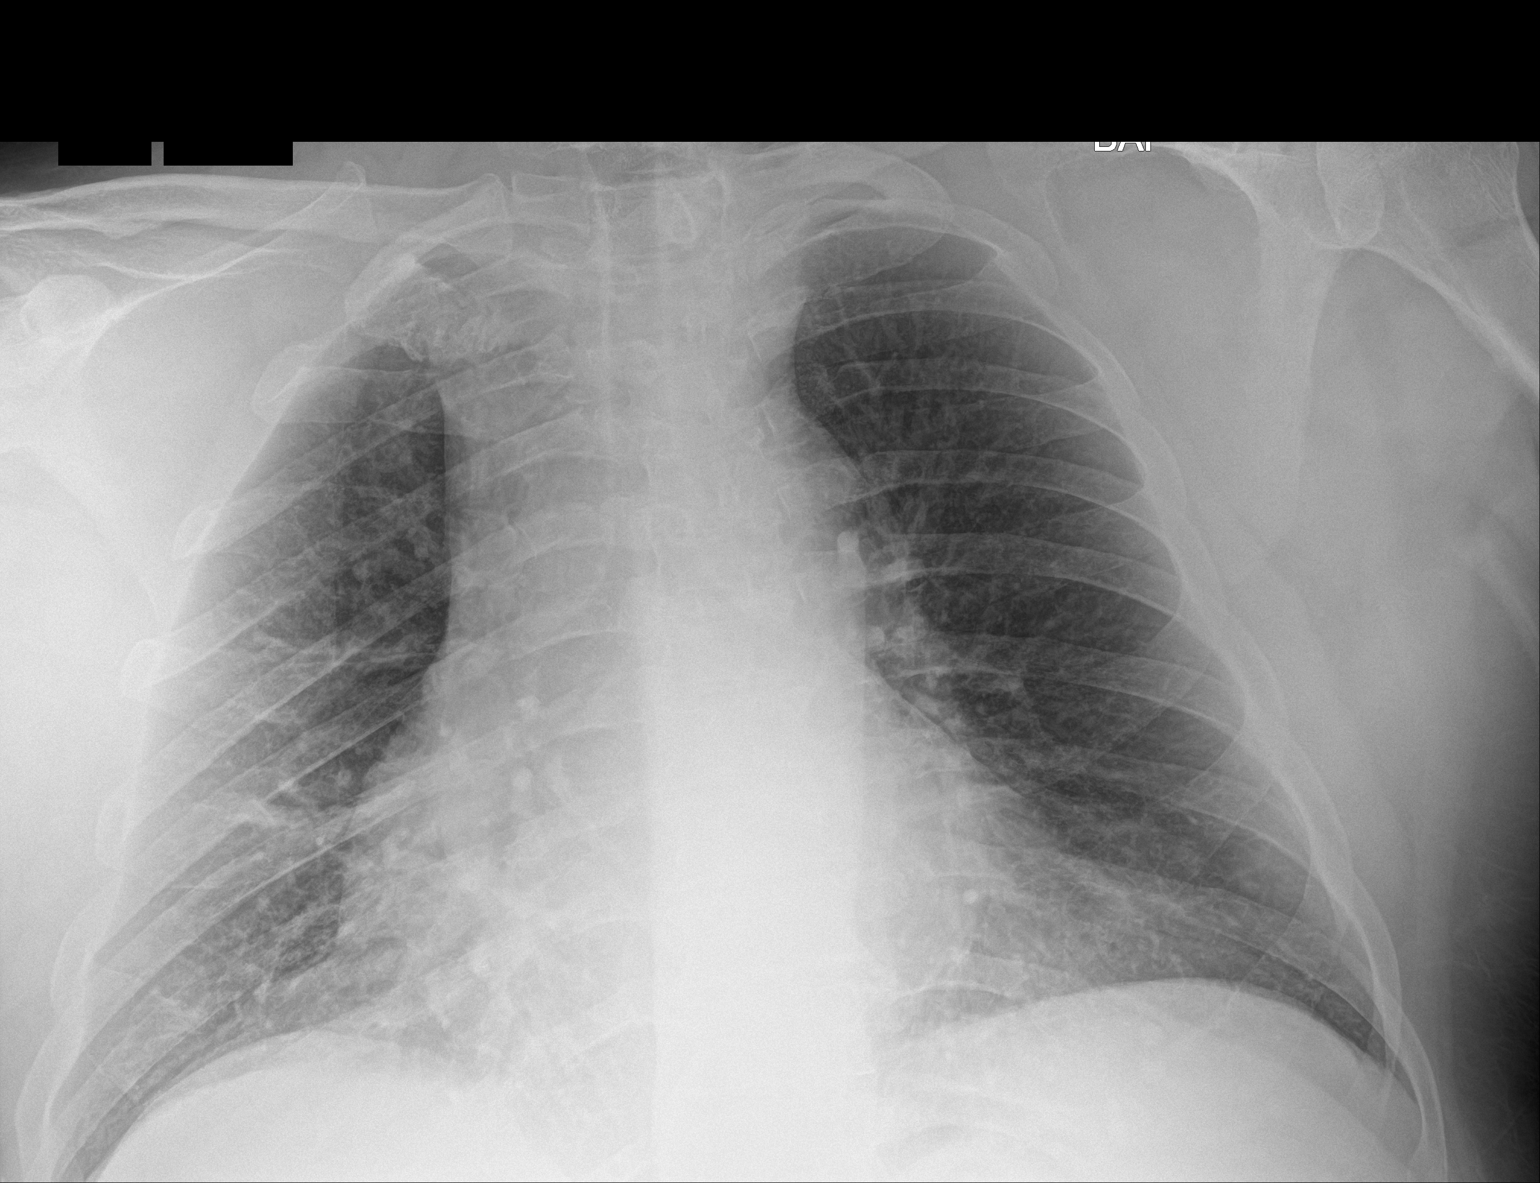

[1 of 1 positions shown; findings below may reference images not displayed]

FINDINGS: Lungs are clear.  No pleural effusion or pneumothorax.

Cardiomegaly.

Cervical spine fixation hardware.
IMPRESSION: No evidence of acute cardiopulmonary disease.

## 2022-02-09 IMAGING — US US RENAL
1 series · 14 of 25 positions shown · non-contrast
Comparison: None.

CLINICAL DATA: Hematuria

EXAM:
RENAL / URINARY TRACT ULTRASOUND COMPLETE

[Series 1: us renal · 14 of 95 slices shown]
[im 1/95]
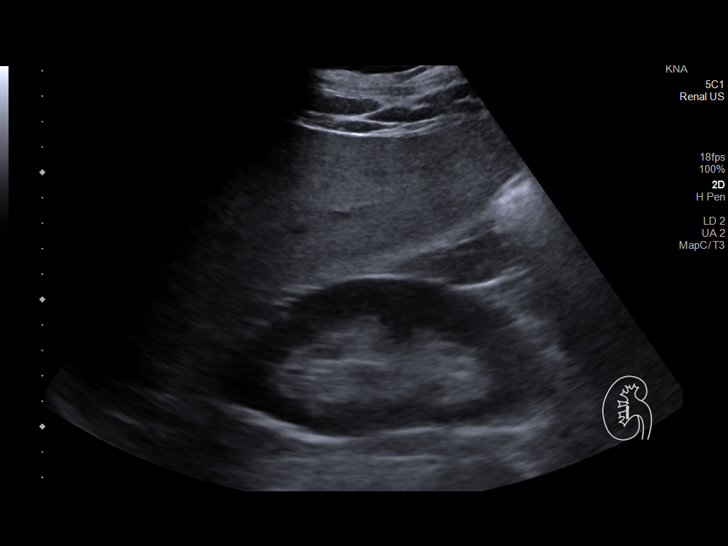
[im 8/95]
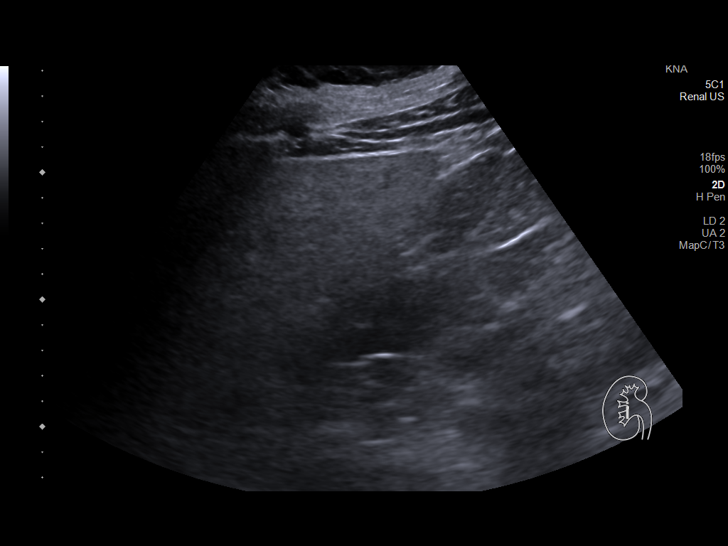
[im 16/95]
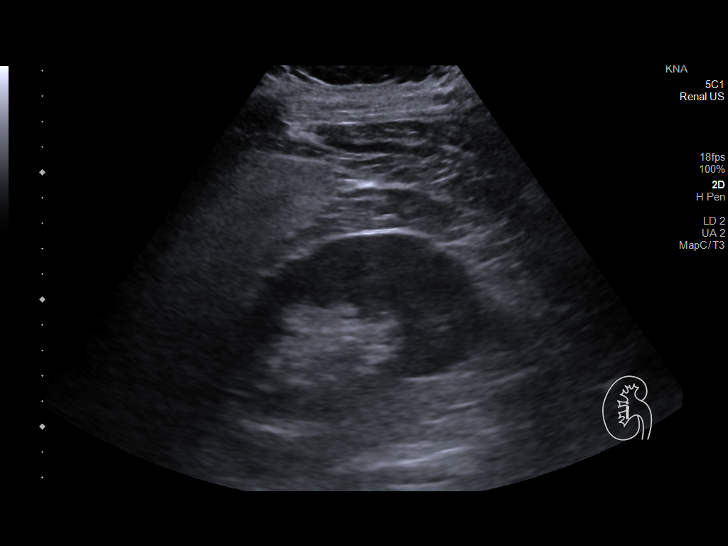
[im 24/95]
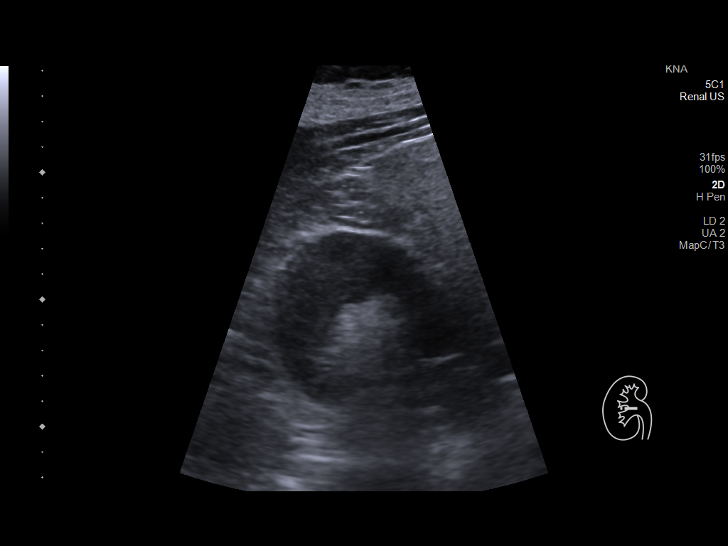
[im 32/95]
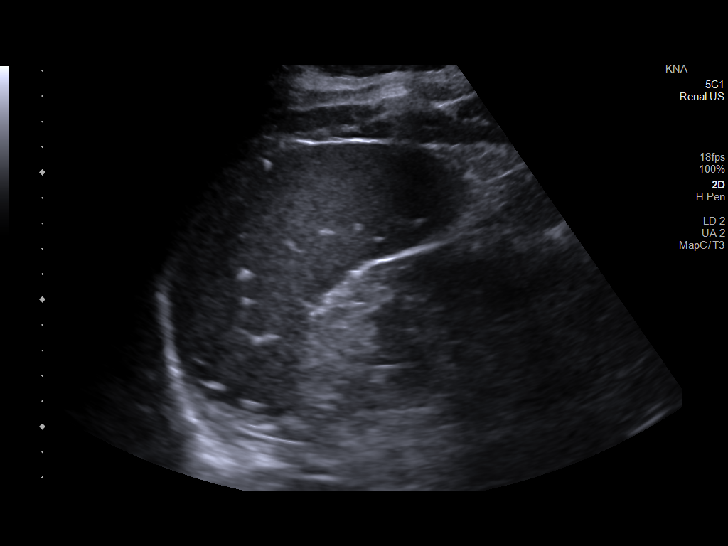
[im 36/95]
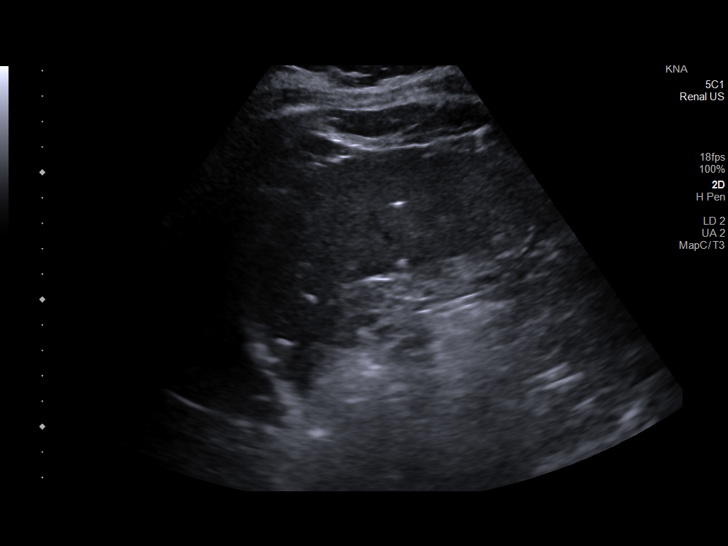
[im 44/95]
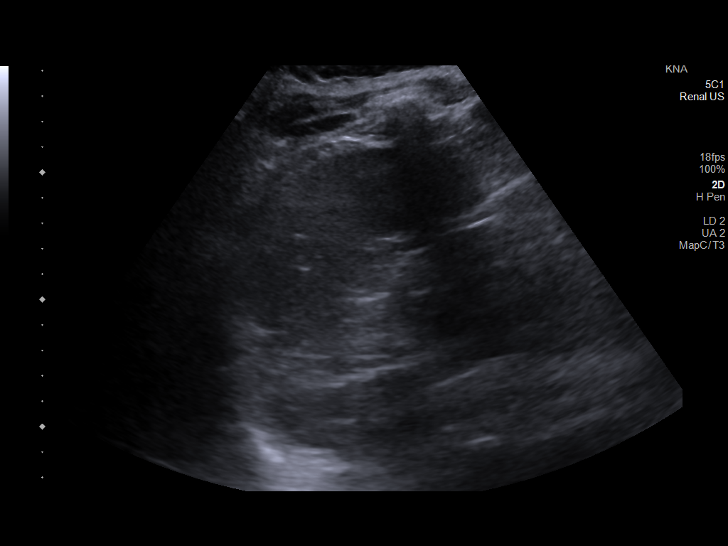
[im 51/95]
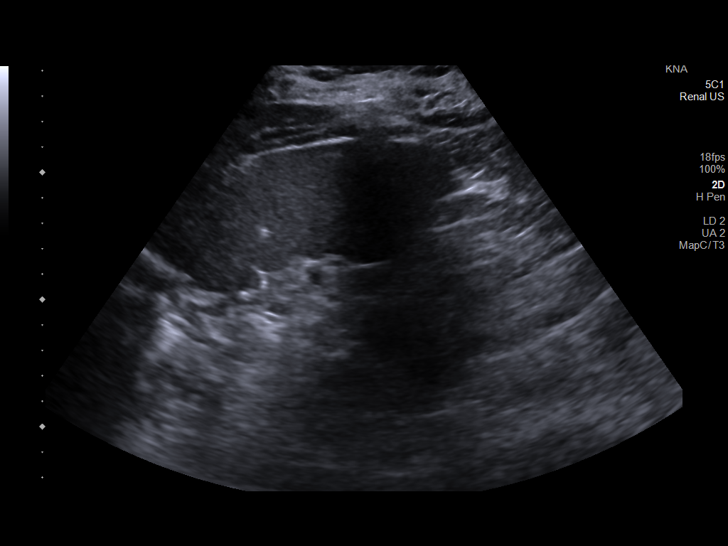
[im 59/95]
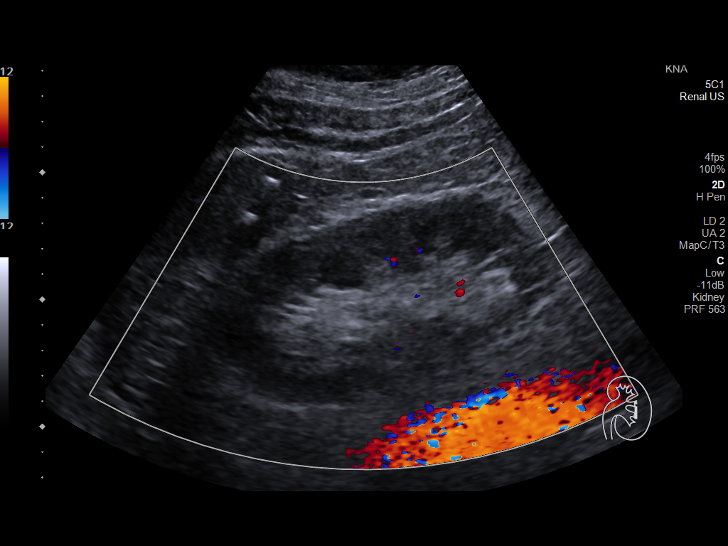
[im 63/95]
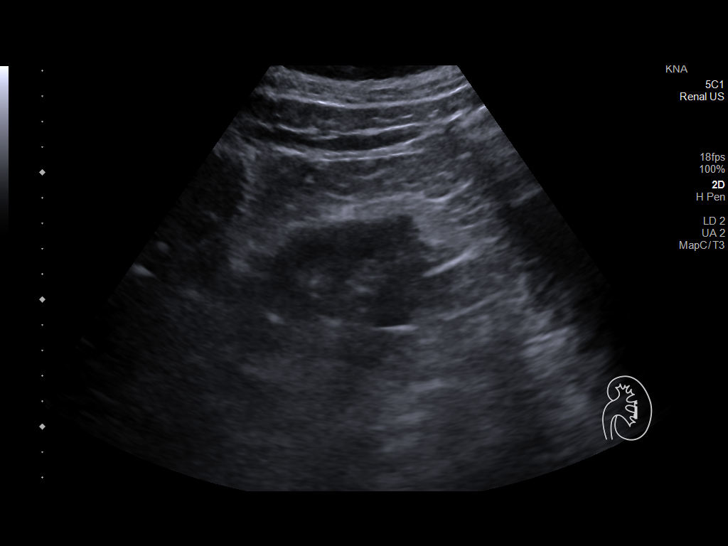
[im 71/95]
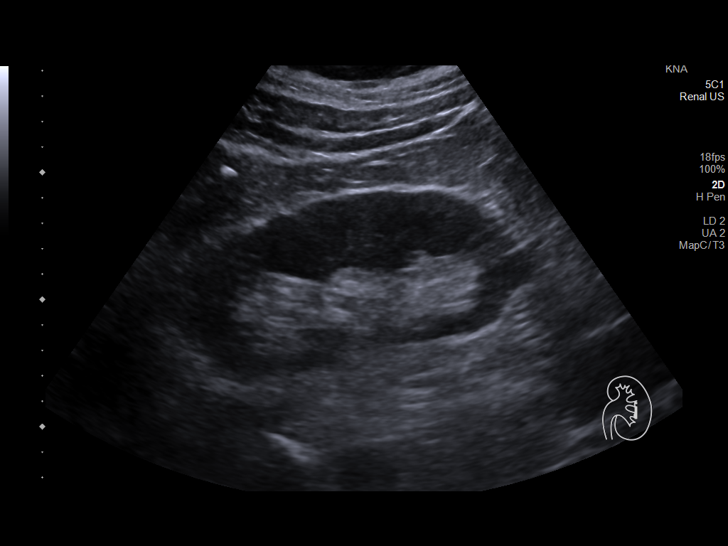
[im 79/95]
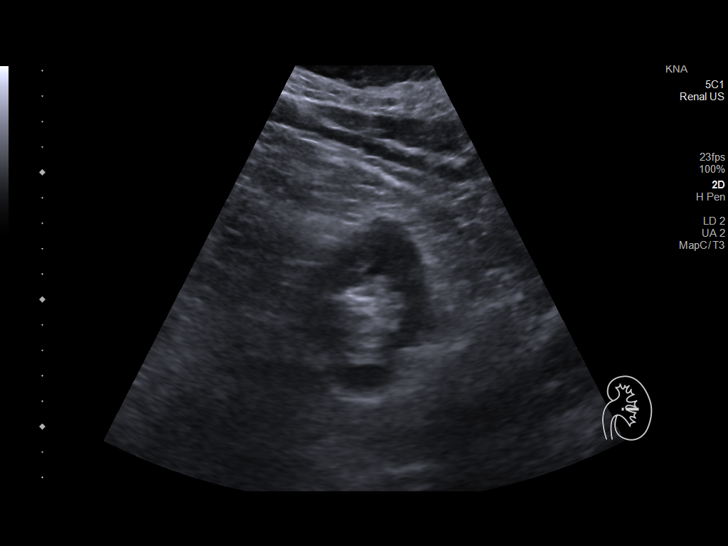
[im 87/95]
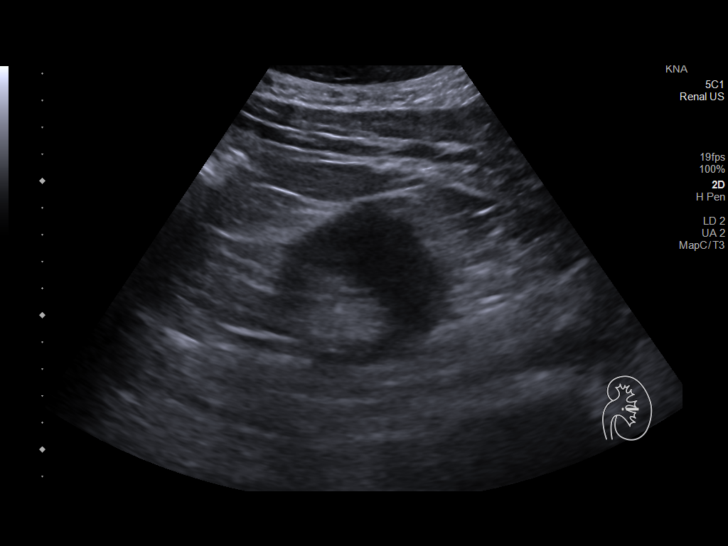
[im 95/95]
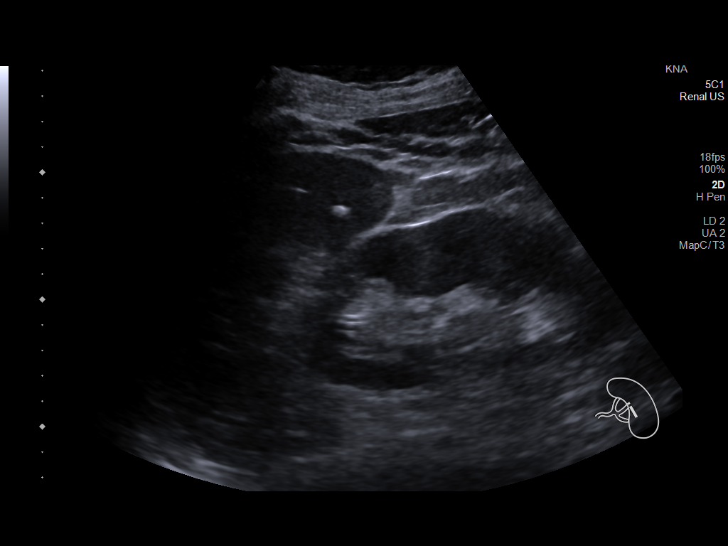

[14 of 25 positions shown; findings below may reference images not displayed]

FINDINGS: Right Kidney:

Renal measurements: 12.3 x 6.7 x 6.2 cm. = volume: 267 mL. There is
a cyst measuring approximately 2.3 cm.

Left Kidney:

Renal measurements: 13.7 x 6.8 x 5.7 cm = volume: 276 mL. A 1.5 cm
cyst is noted in the upper pole.

Bladder:

Both ureteral jets were visualized.

Other:

Incidentally noted are splenic calcifications which can be seen in
patients with a prior granulomatous infection.
IMPRESSION: No acute abnormality.

## 2022-12-06 ENCOUNTER — Ambulatory Visit: Payer: BC Managed Care – PPO | Admitting: Adult Health

## 2022-12-06 ENCOUNTER — Encounter: Payer: Self-pay | Admitting: Adult Health

## 2022-12-06 VITALS — BP 142/60 | HR 80 | Temp 98.1°F | Ht 70.0 in | Wt 286.2 lb

## 2022-12-06 DIAGNOSIS — S30860A Insect bite (nonvenomous) of lower back and pelvis, initial encounter: Secondary | ICD-10-CM

## 2022-12-06 DIAGNOSIS — R197 Diarrhea, unspecified: Secondary | ICD-10-CM | POA: Diagnosis not present

## 2022-12-06 DIAGNOSIS — W57XXXA Bitten or stung by nonvenomous insect and other nonvenomous arthropods, initial encounter: Secondary | ICD-10-CM

## 2022-12-06 LAB — COMPREHENSIVE METABOLIC PANEL
ALT: 28 U/L (ref 0–53)
AST: 24 U/L (ref 0–37)
Albumin: 4 g/dL (ref 3.5–5.2)
Alkaline Phosphatase: 58 U/L (ref 39–117)
BUN: 13 mg/dL (ref 6–23)
CO2: 29 mEq/L (ref 19–32)
Calcium: 9.1 mg/dL (ref 8.4–10.5)
Chloride: 104 mEq/L (ref 96–112)
Creatinine, Ser: 1.02 mg/dL (ref 0.40–1.50)
GFR: 82.3 mL/min (ref >60.00)
Glucose, Bld: 106 mg/dL — ABNORMAL HIGH (ref 70–99)
Potassium: 3.6 mEq/L (ref 3.5–5.1)
Sodium: 141 mEq/L (ref 135–145)
Total Bilirubin: 1.1 mg/dL (ref 0.2–1.2)
Total Protein: 6.9 g/dL (ref 6.0–8.3)

## 2022-12-06 MED ORDER — DOXYCYCLINE HYCLATE 100 MG PO CAPS: 100.0000 mg | ORAL_CAPSULE | Freq: Two times a day (BID) | ORAL | 0 refills | Status: DC

## 2022-12-06 NOTE — Patient Instructions (Addendum)
I am going to check some labs for tick borne illness.   Start doxycycline and over the counter pepto bismol

## 2022-12-06 NOTE — Progress Notes (Signed)
Subjective:    Patient ID: Shane [name], male    DOB: [birth year], 57 y.o.   MRN: [id number]  Diarrhea    57 year old male who  has a past medical history of Chronic knee pain, GERD (gastroesophageal reflux disease), Left shoulder pain, and Obesity.  He presents to the office today for an acute issue. For the last 4 days he has been having diarrhea that happens after he eats.. He denies fevers, chills, nausea, vomiting, fevers, chills,abdominal pain or cramping, blood in stool. He did have mild acid refluc like symptoms when this first started but nothing any longer.   About a week ago he pulled a ( lone star) tick off him that had been on for multiple days and then another tick ( deer tick)  3 days ago from the perineum was " nice a swollen".   He does not feel ill.   He has not been using anything OTC.   Review of Systems  Gastrointestinal:  Positive for diarrhea.   See HPI   Past Medical History:  Diagnosis Date   Chronic knee pain    GERD (gastroesophageal reflux disease)    Left shoulder pain    Obesity     Social History   Socioeconomic History   Marital status: Married    Spouse name: Not on file   Number of children: 3   Years of education: Not on file   Highest education level: Not on file  Occupational History   Occupation: [employer]   Tobacco Use   Smoking status: Never   Smokeless tobacco: Never  Vaping Use   Vaping Use: Never used  Substance and Sexual Activity   Alcohol use: No    Alcohol/week: 0.0 standard drinks of alcohol   Drug use: No   Sexual activity: Not on file  Other Topics Concern   Not on file  Social History Narrative   Not on file   Social Determinants of Health   Financial Resource Strain: Not on file  Food Insecurity: Not on file  Transportation Needs: Not on file  Physical Activity: Not on file  Stress: Not on file  Social Connections: Not on file  Intimate Partner Violence: Not on file    Past Surgical  History:  Procedure Laterality Date   carpal pummel repair Bilateral    CERVICAL DISC SURGERY  2003   per Dr. Newell Coral    COLONOSCOPY  05/10/2018   per Dr. Lavon Paganini, internal hemorrhoids and sessile serrated polyps, repeat in 3 yrs    KNEE SURGERY Right     Family History  Problem Relation Age of Onset   Heart disease Mother        mitrial valve repair, CABG    Prostate cancer Father    Hypertension Father    Colon cancer Father        in his early 33's   Esophageal cancer Neg Hx    Rectal cancer Neg Hx    Stomach cancer Neg Hx     Allergies  Allergen Reactions   Penicillins Cross Reactors Rash    Current Outpatient Medications on File Prior to Visit  Medication Sig Dispense Refill   atorvastatin (LIPITOR) 20 MG tablet Take 1 tablet (20 mg total) by mouth daily. 90 tablet 1   HYDROcodone-acetaminophen (NORCO/VICODIN) 5-325 MG tablet Take 1 tablet by mouth every 6 (six) hours as needed for moderate pain. 15 tablet 0   predniSONE (DELTASONE) 10 MG tablet Taper as follows:  6-6-4-4-3-3-2-2-1-1 32 tablet 0   No current facility-administered medications on file prior to visit.    BP (!) 142/60 (BP Location: Left Arm, Patient Position: Sitting, Cuff Size: Large)   Pulse 80   Temp 98.1 F (36.7 C) (Oral)   Ht 5\' 10"  (1.778 m)   Wt 286 lb 3.2 oz (129.8 kg)   SpO2 98%   BMI 41.07 kg/m       Objective:   Physical Exam Vitals and nursing note reviewed.  Constitutional:      Appearance: Normal appearance. He is obese. He is not ill-appearing, toxic-appearing or diaphoretic.  Cardiovascular:     Rate and Rhythm: Normal rate and regular rhythm.     Pulses: Normal pulses.     Heart sounds: Normal heart sounds.  Pulmonary:     Effort: Pulmonary effort is normal.     Breath sounds: Normal breath sounds.  Abdominal:     General: Abdomen is flat.     Palpations: Abdomen is soft.  Musculoskeletal:        General: Normal range of motion.  Skin:    General: Skin is warm  and dry.     Capillary Refill: Capillary refill takes less than 2 seconds.     Findings: No erythema or rash.  Neurological:     General: No focal deficit present.     Mental Status: He is alert and oriented to person, place, and time.  Psychiatric:        Mood and Affect: Mood normal.        Behavior: Behavior normal.        Thought Content: Thought content normal.        Judgment: Judgment normal.           Assessment & Plan:  1. Acute diarrhea - Possibly start of tick borne illness vs GERD vs, gastroenteritis.  - Will have him start OTC pepto.  - doxycycline (VIBRAMYCIN) 100 MG capsule; Take 1 capsule (100 mg total) by mouth 2 (two) times daily.  Dispense: 14 capsule; Refill: 0 - Comprehensive metabolic panel; Future  2. Tick bite of pelvic region, initial encounter - Will check for RMSF, Lyme and Alpha Gal although alpha gal symptoms do not generally appear for 4-6 weeks.  - Rocky mtn spotted fvr abs pnl(IgG+IgM); Future - Alpha-Gal Panel; Future - B. burgdorfi Antibody; Future  Shirline Frees, NP

## 2022-12-07 ENCOUNTER — Ambulatory Visit: Payer: BC Managed Care – PPO | Admitting: Internal Medicine

## 2022-12-08 ENCOUNTER — Other Ambulatory Visit: Payer: Self-pay

## 2022-12-08 ENCOUNTER — Emergency Department (HOSPITAL_COMMUNITY)
Admission: EM | Admit: 2022-12-08 | Discharge: 2022-12-09 | Disposition: A | Discharge status: Home / Self Care | Payer: BC Managed Care – PPO | Attending: Emergency Medicine | Admitting: Emergency Medicine

## 2022-12-08 ENCOUNTER — Emergency Department (HOSPITAL_COMMUNITY): Payer: BC Managed Care – PPO

## 2022-12-08 ENCOUNTER — Encounter (HOSPITAL_COMMUNITY): Payer: Self-pay | Admitting: Emergency Medicine

## 2022-12-08 DIAGNOSIS — J069 Acute upper respiratory infection, unspecified: Secondary | ICD-10-CM | POA: Diagnosis not present

## 2022-12-08 DIAGNOSIS — R079 Chest pain, unspecified: Secondary | ICD-10-CM | POA: Diagnosis not present

## 2022-12-08 DIAGNOSIS — R509 Fever, unspecified: Secondary | ICD-10-CM | POA: Diagnosis not present

## 2022-12-08 DIAGNOSIS — Z1152 Encounter for screening for COVID-19: Secondary | ICD-10-CM | POA: Insufficient documentation

## 2022-12-08 DIAGNOSIS — M79604 Pain in right leg: Secondary | ICD-10-CM | POA: Insufficient documentation

## 2022-12-08 DIAGNOSIS — B9789 Other viral agents as the cause of diseases classified elsewhere: Secondary | ICD-10-CM | POA: Diagnosis not present

## 2022-12-08 DIAGNOSIS — R0789 Other chest pain: Secondary | ICD-10-CM | POA: Insufficient documentation

## 2022-12-08 LAB — CBC WITH DIFFERENTIAL/PLATELET
Abs Immature Granulocytes: 0.05 10*3/uL (ref 0.00–0.07)
Basophils Absolute: 0.1 10*3/uL (ref 0.0–0.1)
Basophils Relative: 0 %
Eosinophils Absolute: 0 10*3/uL (ref 0.0–0.5)
Eosinophils Relative: 0 %
HCT: 47.7 % (ref 39.0–52.0)
Hemoglobin: 15.4 g/dL (ref 13.0–17.0)
Immature Granulocytes: 0 %
Lymphocytes Relative: 7 %
Lymphs Abs: 0.9 10*3/uL (ref 0.7–4.0)
MCH: 29 pg (ref 26.0–34.0)
MCHC: 32.3 g/dL (ref 30.0–36.0)
MCV: 89.8 fL (ref 80.0–100.0)
Monocytes Absolute: 1.1 10*3/uL — ABNORMAL HIGH (ref 0.1–1.0)
Monocytes Relative: 8 %
Neutro Abs: 11.4 10*3/uL — ABNORMAL HIGH (ref 1.7–7.7)
Neutrophils Relative %: 85 %
Platelets: 181 10*3/uL (ref 150–400)
RBC: 5.31 MIL/uL (ref 4.22–5.81)
RDW: 14 % (ref 11.5–15.5)
WBC: 13.6 10*3/uL — ABNORMAL HIGH (ref 4.0–10.5)
nRBC: 0 % (ref 0.0–0.2)

## 2022-12-08 LAB — COMPREHENSIVE METABOLIC PANEL
ALT: 35 U/L (ref 0–44)
AST: 36 U/L (ref 15–41)
Albumin: 4 g/dL (ref 3.5–5.0)
Alkaline Phosphatase: 56 U/L (ref 38–126)
Anion gap: 8 (ref 5–15)
BUN: 11 mg/dL (ref 6–20)
CO2: 27 mmol/L (ref 22–32)
Calcium: 9.3 mg/dL (ref 8.9–10.3)
Chloride: 104 mmol/L (ref 98–111)
Creatinine, Ser: 0.96 mg/dL (ref 0.61–1.24)
GFR, Estimated: >60 mL/min (ref >60)
Glucose, Bld: 103 mg/dL — ABNORMAL HIGH (ref 70–99)
Potassium: 4.2 mmol/L (ref 3.5–5.1)
Sodium: 139 mmol/L (ref 135–145)
Total Bilirubin: 1.6 mg/dL — ABNORMAL HIGH (ref 0.3–1.2)
Total Protein: 7.2 g/dL (ref 6.5–8.1)

## 2022-12-08 LAB — LACTIC ACID, PLASMA: Lactic Acid, Venous: 1.4 mmol/L (ref 0.5–1.9)

## 2022-12-08 LAB — BRAIN NATRIURETIC PEPTIDE: B Natriuretic Peptide: 31.2 pg/mL (ref 0.0–100.0)

## 2022-12-08 LAB — TROPONIN I (HIGH SENSITIVITY): Troponin I (High Sensitivity): 9 ng/L (ref <18)

## 2022-12-08 MED ORDER — ACETAMINOPHEN 325 MG PO TABS
650.0000 mg | ORAL_TABLET | Freq: Once | ORAL | Status: AC | PRN
  Administered 2022-12-08: 650 mg via ORAL
  Filled 2022-12-08: qty 2

## 2022-12-08 NOTE — ED Triage Notes (Addendum)
Pt arrives via POV with s/o. [place] had sudden onset of centralized chest tightness with squeezing pain. Pain worsening with exertion. Associated with fevers and chills. Endorses worsening bilateral leg swelling over the past week. Per note pt is currently on doxycycline for recent exposure to tick bite.

## 2022-12-09 LAB — URINALYSIS, ROUTINE W REFLEX MICROSCOPIC
Bacteria, UA: NONE SEEN
Bilirubin Urine: NEGATIVE
Glucose, UA: NEGATIVE mg/dL
Ketones, ur: NEGATIVE mg/dL
Leukocytes,Ua: NEGATIVE
Nitrite: NEGATIVE
Protein, ur: NEGATIVE mg/dL
Specific Gravity, Urine: 1.017 (ref 1.005–1.030)
pH: 7 (ref 5.0–8.0)

## 2022-12-09 LAB — RESP PANEL BY RT-PCR (RSV, FLU A&B, COVID)  RVPGX2
Influenza A by PCR: NEGATIVE
Influenza B by PCR: NEGATIVE
Resp Syncytial Virus by PCR: NEGATIVE
SARS Coronavirus 2 by RT PCR: NEGATIVE

## 2022-12-09 LAB — TROPONIN I (HIGH SENSITIVITY): Troponin I (High Sensitivity): 9 ng/L (ref <18)

## 2022-12-09 MED ORDER — LIDOCAINE 4 % EX PTCH: 1.0000 | MEDICATED_PATCH | CUTANEOUS | 0 refills | Status: AC

## 2022-12-09 MED ORDER — KETOROLAC TROMETHAMINE 30 MG/ML IJ SOLN
30.0000 mg | Freq: Once | INTRAMUSCULAR | Status: AC
  Administered 2022-12-09: 30 mg via INTRAMUSCULAR
  Filled 2022-12-09: qty 1

## 2022-12-09 MED ORDER — ACETAMINOPHEN 325 MG PO TABS
650.0000 mg | ORAL_TABLET | Freq: Once | ORAL | Status: AC | PRN
  Administered 2022-12-09: 650 mg via ORAL
  Filled 2022-12-09: qty 2

## 2022-12-09 NOTE — ED Provider Notes (Signed)
[place] EMERGENCY DEPARTMENT AT [facility] Provider Note   CSN: [id number] Arrival date & time: 12/08/22  2159     History  Chief Complaint  Patient presents with   Chest Pain   Fever    Shane [name] is a 57 y.o. male with a history of sepsis, hyperlipidemia, and GERD presents to the ED today for chest pressure and fever. Three hours prior to the arrival, patient developed chest tightness, fever, chills, and burning pain that radiated from his right hip to right knee. Patient reports that he intermittently has chest pressure that is localized to the center of his chest. He states that this can happen at rest or with exertion.  He has experienced the symptoms for the past several months, but he was concerned today since it was associated with fever and other symptoms.  Patient's wife reports that he frequently finds ticks on himself and went to his primary care doctor 3 days ago and was started on Doxycycline for Lyme disease prophylaxis.   Subsequently, patient reports intermittent shooting pain down his lateral right thigh.  The pain is sharp and burning and starts at his hip and stops at his knee.  Pain is worse with range of motion and better at rest.  He does not take anything for the pain.    Home Medications Prior to Admission medications   Medication Sig Start Date End Date Taking? Authorizing Provider  atorvastatin (LIPITOR) 20 MG tablet Take 1 tablet (20 mg total) by mouth daily. 02/04/21   Philip Aspen, Limmie Patricia, MD  doxycycline (VIBRAMYCIN) 100 MG capsule Take 1 capsule (100 mg total) by mouth 2 (two) times daily. 12/06/22   Nafziger, Kandee Keen, NP  HYDROcodone-acetaminophen (NORCO/VICODIN) 5-325 MG tablet Take 1 tablet by mouth every 6 (six) hours as needed for moderate pain. 03/01/21   Burchette, Elberta Fortis, MD  predniSONE (DELTASONE) 10 MG tablet Taper as follows: 6-6-4-4-3-3-2-2-1-1 03/01/21   Burchette, Elberta Fortis, MD      Allergies    Penicillins cross  reactors    Review of Systems   Review of Systems  Constitutional:  Positive for fever.  Cardiovascular:  Positive for chest pain.  Musculoskeletal:        Burning pain that radiates down the right hip and stops at the knee    Physical Exam Updated Vital Signs BP (!) 106/57   Pulse 94   Temp 99.9 F (37.7 C) (Oral)   Resp (!) 23   Ht 5\' 10"  (1.778 m)   Wt 129.7 kg   SpO2 98%   BMI 41.04 kg/m  Physical Exam Vitals and nursing note reviewed.  Constitutional:      Appearance: Normal appearance.  HENT:     Head: Normocephalic and atraumatic.     Mouth/Throat:     Mouth: Mucous membranes are moist.  Eyes:     Conjunctiva/sclera: Conjunctivae normal.     Pupils: Pupils are equal, round, and reactive to light.  Cardiovascular:     Rate and Rhythm: Normal rate and regular rhythm.     Pulses: Normal pulses.     Heart sounds: Normal heart sounds.  Pulmonary:     Effort: Pulmonary effort is normal.     Breath sounds: Normal breath sounds.  Chest:     Chest wall: Tenderness present.     Comments: Localized tenderness to center of chest Abdominal:     Palpations: Abdomen is soft.     Tenderness: There is no abdominal tenderness.  Musculoskeletal:     Right lower leg: No edema.     Left lower leg: No edema.  Skin:    General: Skin is warm and dry.     Capillary Refill: Capillary refill takes less than 2 seconds.     Findings: No rash.  Neurological:     General: No focal deficit present.     Mental Status: He is alert.     Motor: No weakness.  Psychiatric:        Mood and Affect: Mood normal.        Behavior: Behavior normal.     ED Results / Procedures / Treatments   Labs (all labs ordered are listed, but only abnormal results are displayed) Labs Reviewed  COMPREHENSIVE METABOLIC PANEL - Abnormal; Notable for the following components:      Result Value   Glucose, Bld 103 (*)    Total Bilirubin 1.6 (*)    All other components within normal limits  CBC WITH  DIFFERENTIAL/PLATELET - Abnormal; Notable for the following components:   WBC 13.6 (*)    Neutro Abs 11.4 (*)    Monocytes Absolute 1.1 (*)    All other components within normal limits  URINALYSIS, ROUTINE W REFLEX MICROSCOPIC - Abnormal; Notable for the following components:   Hgb urine dipstick SMALL (*)    All other components within normal limits  RESP PANEL BY RT-PCR (RSV, FLU A&B, COVID)  RVPGX2  LACTIC ACID, PLASMA  BRAIN NATRIURETIC PEPTIDE  TROPONIN I (HIGH SENSITIVITY)  TROPONIN I (HIGH SENSITIVITY)    EKG None  Radiology DG Chest 2 View  Result Date: 12/08/2022 CLINICAL DATA:  [id number] with chest pain. EXAM: CHEST - 2 VIEW COMPARISON:  Portable chest 02/16/2020 FINDINGS: The heart size and mediastinal contours are within normal limits. Both lungs are clear apart from mild chronic changes again noted. The visualized skeletal structures are intact, with thoracic spondylosis. C6-7 ACDF plating is again shown. IMPRESSION: No active cardiopulmonary disease.  Mild chronic change. Electronically Signed   By: Almira Bar M.D.   On: 12/08/2022 22:48    Procedures Procedures: not indicated.   Medications Ordered in ED Medications  acetaminophen (TYLENOL) tablet 650 mg (650 mg Oral Given 12/08/22 2227)    ED Course/ Medical Decision Making/ A&P                             Medical Decision Making Amount and/or Complexity of Data Reviewed Labs: ordered. Radiology: ordered.  Risk OTC drugs.   This patient presents to the ED for concern of fever, chest and leg pain, this involves an extensive number of treatment options, and is a complaint that carries with it a high risk of complications and morbidity.   Differential diagnosis includes: ACS, PE, pleuritis, sepsis, radiculopathy, shingles, muscle strain, tendinitis, cellulitis, dehydration, electrolyte abnormality   Co morbidities that complicate the patient evaluation  GERD Obesity Hyperlipidemia   Additional  history obtained:  Additional history obtained from his significant other   Cardiac Monitoring / EKG:  The patient was maintained on a cardiac monitor.  I personally viewed and interpreted the cardiac monitored which showed: sinus tachycardia with a heart rate of 100 bpm.   Lab Tests:  I ordered and personally interpreted labs.  The pertinent results include:  Unremarkable respiratory panel Negative troponin Unremarkable BNP CMP within normal limits -no electrolyte abnormalities, kidney injury, liver or gallbladder conditions   Problem List / ED Course /  Critical interventions / Medication management  Fever, palpitations, right leg pain I ordered medications including: Tylenol x2 for fever  Ketorolac for pain Reevaluation of the patient after these medicines showed that the patient improved] Instructed to check his skin to ensure there is no rash forming.  Informed patient that pain may precipitate shingles infection to follow-up with primary care if that occurs Discharge patient home with lidocaine patches for right lateral thigh pain. I have reviewed the patients home medicines and have made adjustments as needed   Social Determinants of Health:  Social connections Occupation Housing   Test / Admission - Considered:  Patient is hemodynamically stable Palpitation and fever most likely due to URI.  Instructed to alternate between Tylenol and ibuprofen for fever. Instructed to complete full course of doxycycline. Apply 1 lidocaine patch a day for leg pain Follow-up with primary care in 1 week further evaluation of leg pain if it persists Tricked return precautions given         Final Clinical Impression(s) / ED Diagnoses Final diagnoses:  None    Rx / DC Orders ED Discharge Orders     None         Maxwell Marion, PA-C 12/09/22 0736    Palumbo, April, MD 12/09/22 1308

## 2022-12-09 NOTE — Discharge Instructions (Addendum)
Place one Lidocaine patch per day for leg pain. Monitor the appearance of the skin on your right thigh. If a rash appears follow up with your PCP. Pain can precipitate shingles flare.   Follow up with PCP for further evaluation of shooting leg pain to rule out radiculopathy.   Please complete full course of Doxycycline. You can alternate between Ibuprofen and Tylenol for fever relief.

## 2022-12-09 NOTE — ED Notes (Addendum)
error 

## 2022-12-13 ENCOUNTER — Telehealth: Payer: Self-pay | Admitting: Internal Medicine

## 2022-12-13 ENCOUNTER — Ambulatory Visit: Payer: BC Managed Care – PPO | Admitting: Family Medicine

## 2022-12-13 ENCOUNTER — Encounter: Payer: Self-pay | Admitting: Family Medicine

## 2022-12-13 VITALS — BP 120/76 | HR 76 | Temp 98.4°F | Wt 287.0 lb

## 2022-12-13 DIAGNOSIS — L03115 Cellulitis of right lower limb: Secondary | ICD-10-CM | POA: Diagnosis not present

## 2022-12-13 DIAGNOSIS — M7989 Other specified soft tissue disorders: Secondary | ICD-10-CM

## 2022-12-13 DIAGNOSIS — R197 Diarrhea, unspecified: Secondary | ICD-10-CM | POA: Diagnosis not present

## 2022-12-13 DIAGNOSIS — D7389 Other diseases of spleen: Secondary | ICD-10-CM

## 2022-12-13 DIAGNOSIS — R0602 Shortness of breath: Secondary | ICD-10-CM

## 2022-12-13 MED ORDER — DOXYCYCLINE HYCLATE 100 MG PO CAPS
100.0000 mg | ORAL_CAPSULE | Freq: Two times a day (BID) | ORAL | 0 refills | Status: DC
Start: 2022-12-13 — End: 2023-01-25

## 2022-12-13 NOTE — Telephone Encounter (Signed)
Pt is returning Shane [name] call 

## 2022-12-13 NOTE — Progress Notes (Signed)
   Subjective:    Patient ID: Shane [name], male    DOB: 03-08-1966, 57 y.o.   MRN: [id number]  HPI Here to follow up an ED visit on 12-08-22 for fever to 103 degrees, pain and swelling in the right leg, and tightness in his chest. Prior to that he was seen here on 12-06-22 for 2 recent tick bites. He felt fine that day, but he was started on a 7 day course of Doxycycline. He tested negative for Lyme disease and RMSF. Then the swelling in the right leg began and he developed redness and warmth and pain in the right leg from the pelvis down to the foot. At the ED his temp was 99.9 degrees. His WBC count was elevated to 13.6. Otherwise the labs looked okay. His CXR was clear. It was felt that he had cellulitis in the leg (he has had this before), and they told him to finish the Doxycycline and to follow up with [place]. Since then the redness and warmth in the leg has diminished, and the fever has come down a little. He still has the swelling and the pain in the right leg however, as well as the chest tightness. No hx of blood clots.    Review of Systems  Constitutional:  Positive for fever.  HENT: Negative.    Eyes: Negative.   Respiratory:  Positive for chest tightness. Negative for cough, shortness of breath and wheezing.   Cardiovascular:  Positive for leg swelling. Negative for chest pain and palpitations.  Gastrointestinal: Negative.        Objective:   Physical Exam Constitutional:      General: He is not in acute distress. Cardiovascular:     Rate and Rhythm: Normal rate and regular rhythm.     Pulses: Normal pulses.     Heart sounds: Normal heart sounds.  Pulmonary:     Effort: Pulmonary effort is normal.     Breath sounds: Normal breath sounds.  Musculoskeletal:     Comments: The entire right leg has 4+ edema from the pelvis to the foot. No warmth or erythema. No tenderness. No cords were felt   Neurological:     Mental Status: He is alert.           Assessment & Plan:   This all seems to have started with a bout of cellulitis in the right lower leg, and this has been partially treated by the Doxycycline. We will guve him an additional 10 days of Doxycycline. However I am concerned that he also has a DVT in the right leg, and possibly a PE as well. We will order a stat venous doppler of the right leg and a stat CT angiogram of the chest. Hopefully these can be done in the next 24 hours. We will follow up according to these results. He knows to return to the ED if he becomes SOB or if he has chest pain. We spent a total of ( 35  ) minutes reviewing records and discussing these issues.  Gershon Crane, MD

## 2022-12-14 NOTE — Telephone Encounter (Signed)
Please see result note 

## 2022-12-15 ENCOUNTER — Telehealth: Payer: Self-pay | Admitting: *Deleted

## 2022-12-15 ENCOUNTER — Ambulatory Visit
Admission: RE | Admit: 2022-12-15 | Discharge: 2022-12-15 | Disposition: A | Discharge status: Home / Self Care | Payer: BC Managed Care – PPO | Source: Ambulatory Visit | Attending: Family Medicine | Admitting: Family Medicine

## 2022-12-15 ENCOUNTER — Ambulatory Visit (HOSPITAL_COMMUNITY)
Admission: RE | Admit: 2022-12-15 | Discharge: 2022-12-15 | Disposition: A | Discharge status: Home / Self Care | Payer: BC Managed Care – PPO | Source: Ambulatory Visit | Attending: Family Medicine | Admitting: Family Medicine

## 2022-12-15 DIAGNOSIS — M7989 Other specified soft tissue disorders: Secondary | ICD-10-CM | POA: Diagnosis not present

## 2022-12-15 DIAGNOSIS — R0602 Shortness of breath: Secondary | ICD-10-CM | POA: Diagnosis not present

## 2022-12-15 DIAGNOSIS — D7389 Other diseases of spleen: Secondary | ICD-10-CM | POA: Diagnosis not present

## 2022-12-15 DIAGNOSIS — R600 Localized edema: Secondary | ICD-10-CM | POA: Diagnosis not present

## 2022-12-15 MED ORDER — IOPAMIDOL (ISOVUE-370) INJECTION 76%
75.0000 mL | Freq: Once | INTRAVENOUS | Status: AC | PRN
  Administered 2022-12-15: 75 mL via INTRAVENOUS

## 2022-12-15 NOTE — Telephone Encounter (Signed)
I understand, he will get the chest CT later today

## 2022-12-15 NOTE — Telephone Encounter (Signed)
CRITICAL VALUE STICKER  CRITICAL VALUE: negative DVT  RECEIVER (on-site recipient of call):Arlett Goold  DATE & TIME NOTIFIED: 12/15/22 9:57 am  MESSENGER (representative from lab):  MD NOTIFIED: Clent Ridges, MD  TIME OF NOTIFICATION:9:57 am  RESPONSE:  waiting on response

## 2022-12-15 NOTE — Telephone Encounter (Signed)
Pt is asking for a call back to discuss his results from his Chest CT today, when they come in.

## 2022-12-16 ENCOUNTER — Other Ambulatory Visit (INDEPENDENT_AMBULATORY_CARE_PROVIDER_SITE_OTHER): Payer: BC Managed Care – PPO

## 2022-12-16 DIAGNOSIS — K7200 Acute and subacute hepatic failure without coma: Secondary | ICD-10-CM | POA: Diagnosis not present

## 2022-12-16 DIAGNOSIS — D7389 Other diseases of spleen: Secondary | ICD-10-CM

## 2022-12-16 NOTE — Telephone Encounter (Signed)
Noted  

## 2022-12-16 NOTE — Telephone Encounter (Signed)
I discussed this with him (see my Result Note)

## 2022-12-16 NOTE — Addendum Note (Signed)
Addended by: Gershon Crane A on: 12/16/2022 12:43 PM   Modules accepted: Orders

## 2022-12-18 LAB — ALPHA-GAL PANEL
Allergen, Mutton, f88: 0.16 kU/L — ABNORMAL HIGH
Allergen, Pork, f26: 0.1 kU/L — ABNORMAL HIGH
Beef: 0.43 kU/L — ABNORMAL HIGH
CLASS: 1
GALACTOSE-ALPHA-1,3-GALACTOSE IGE*: 2.44 kU/L — ABNORMAL HIGH (ref <0.10)

## 2022-12-18 LAB — ROCKY MTN SPOTTED FVR ABS PNL(IGG+IGM)
RMSF IgG: NOT DETECTED
RMSF IgM: NOT DETECTED

## 2022-12-18 LAB — B. BURGDORFI ANTIBODIES: B burgdorferi Ab IgG+IgM: <0.9 index

## 2022-12-18 LAB — INTERPRETATION:

## 2022-12-18 LAB — QUANTIFERON-TB GOLD PLUS: Mitogen-NIL: >10 IU/mL

## 2022-12-19 LAB — QUANTIFERON-TB GOLD PLUS
QuantiFERON-TB Gold Plus: NEGATIVE
TB2-NIL: 0.02 IU/mL

## 2022-12-19 LAB — ANGIOTENSIN CONVERTING ENZYME: Angiotensin-Converting Enzyme: 22 U/L (ref 9–67)

## 2022-12-20 LAB — QUANTIFERON-TB GOLD PLUS
NIL: 0.03 IU/mL
TB1-NIL: 0.02 IU/mL

## 2022-12-20 LAB — LYSOZYME, SERUM: Lysozyme, Serum: 9.1 ug/mL (ref 5.0–11.0)

## 2022-12-27 ENCOUNTER — Ambulatory Visit
Admission: RE | Admit: 2022-12-27 | Discharge: 2022-12-27 | Disposition: A | Discharge status: Home / Self Care | Payer: BC Managed Care – PPO | Source: Ambulatory Visit | Attending: Family Medicine | Admitting: Family Medicine

## 2022-12-27 DIAGNOSIS — D7389 Other diseases of spleen: Secondary | ICD-10-CM | POA: Diagnosis not present

## 2022-12-27 DIAGNOSIS — K760 Fatty (change of) liver, not elsewhere classified: Secondary | ICD-10-CM | POA: Diagnosis not present

## 2022-12-27 MED ORDER — IOPAMIDOL (ISOVUE-300) INJECTION 61%
100.0000 mL | Freq: Once | INTRAVENOUS | Status: AC | PRN
  Administered 2022-12-27: 100 mL via INTRAVENOUS

## 2022-12-28 ENCOUNTER — Telehealth: Payer: Self-pay | Admitting: Internal Medicine

## 2022-12-28 NOTE — Telephone Encounter (Signed)
Pt wife is calling and pt had imaging at [facility] yesterday and pt took today off due to contrast caused him diarrhea. Pt would like work note for today and tomorrow . Please advise

## 2022-12-29 NOTE — Telephone Encounter (Signed)
Please give him work letters as above

## 2022-12-29 NOTE — Telephone Encounter (Signed)
Left a message for pt advised that the letter is available on  MyChart

## 2023-01-06 ENCOUNTER — Telehealth: Payer: Self-pay | Admitting: Internal Medicine

## 2023-01-06 NOTE — Telephone Encounter (Signed)
Pt wife is calling and pt can not access mychart and would like ct abd pelvis results

## 2023-01-09 NOTE — Telephone Encounter (Signed)
Left a message for the patient to return my call as I do not see a DPR on file to speak with his wife.

## 2023-01-10 NOTE — Telephone Encounter (Signed)
Pt is returning joann call °

## 2023-01-10 NOTE — Telephone Encounter (Signed)
Patient returned call

## 2023-01-11 NOTE — Telephone Encounter (Signed)
Patient notified of update  and verbalized understanding. 

## 2023-01-25 ENCOUNTER — Encounter: Payer: Self-pay | Admitting: Internal Medicine

## 2023-01-25 ENCOUNTER — Ambulatory Visit: Payer: BC Managed Care – PPO | Admitting: Internal Medicine

## 2023-01-25 VITALS — BP 125/84 | HR 65 | Temp 98.3°F | Wt 282.9 lb

## 2023-01-25 DIAGNOSIS — M5441 Lumbago with sciatica, right side: Secondary | ICD-10-CM

## 2023-01-25 MED ORDER — MELOXICAM 7.5 MG PO TABS
7.5000 mg | ORAL_TABLET | Freq: Every day | ORAL | 0 refills | Status: DC
Start: 2023-01-25 — End: 2023-02-07

## 2023-01-25 MED ORDER — CYCLOBENZAPRINE HCL 5 MG PO TABS
5.0000 mg | ORAL_TABLET | Freq: Every evening | ORAL | 0 refills | Status: AC | PRN
Start: 2023-01-25 — End: ?

## 2023-01-25 NOTE — Progress Notes (Signed)
Established Patient Office Visit     CC/Reason for Visit: Right-sided low back pain with right leg numbness  HPI: Shane [name] is a 57 y.o. male who is coming in today for the above mentioned reasons.  He has been experiencing the symptoms for about a week.  He notices that his buttock back inside of his thigh have been number at times.  Pain is in his right lower back.  No injury that he can recall.   Past Medical/Surgical History: Past Medical History:  Diagnosis Date   Chronic knee pain    GERD (gastroesophageal reflux disease)    Left shoulder pain    Obesity     Past Surgical History:  Procedure Laterality Date   carpal pummel repair Bilateral    CERVICAL DISC SURGERY  2003   per Dr. Newell Coral    COLONOSCOPY  05/10/2018   per Dr. Lavon Paganini, internal hemorrhoids and sessile serrated polyps, repeat in 3 yrs    KNEE SURGERY Right     Social History:  reports that he has never smoked. He has never used smokeless tobacco. He reports that he does not drink alcohol and does not use drugs.  Allergies: Allergies  Allergen Reactions   Penicillins Cross Reactors Rash    Family History:  Family History  Problem Relation Age of Onset   Heart disease Mother        mitrial valve repair, CABG    Prostate cancer Father    Hypertension Father    Colon cancer Father        in his early 46's   Esophageal cancer Neg Hx    Rectal cancer Neg Hx    Stomach cancer Neg Hx      Current Outpatient Medications:    atorvastatin (LIPITOR) 20 MG tablet, Take 1 tablet (20 mg total) by mouth daily., Disp: 90 tablet, Rfl: 1   cyclobenzaprine (FLEXERIL) 5 MG tablet, Take 1 tablet (5 mg total) by mouth at bedtime as needed for muscle spasms., Disp: 30 tablet, Rfl: 0   meloxicam (MOBIC) 7.5 MG tablet, Take 1 tablet (7.5 mg total) by mouth daily., Disp: 30 tablet, Rfl: 0  Review of Systems:  Negative unless indicated in HPI.   Physical Exam: Vitals:   01/25/23 0915 01/25/23  0921  BP: (!) 140/96 125/84  Pulse: 65   Temp: 98.3 F (36.8 C)   TempSrc: Oral   SpO2: 97%   Weight: 282 lb 14.4 oz (128.3 kg)     Body mass index is 40.59 kg/m.   Physical Exam Vitals reviewed.  Constitutional:      Appearance: Normal appearance.  HENT:     Head: Normocephalic and atraumatic.  Eyes:     Conjunctiva/sclera: Conjunctivae normal.     Pupils: Pupils are equal, round, and reactive to light.  Skin:    General: Skin is warm and dry.  Neurological:     Mental Status: He is alert and oriented to person, place, and time.  Psychiatric:        Mood and Affect: Mood normal.        Behavior: Behavior normal.        Thought Content: Thought content normal.        Judgment: Judgment normal.      Impression and Plan:  Acute right-sided low back pain with right-sided sciatica -     Meloxicam; Take 1 tablet (7.5 mg total) by mouth daily.  Dispense: 30 tablet; Refill: 0 -  Cyclobenzaprine HCl; Take 1 tablet (5 mg total) by mouth at bedtime as needed for muscle spasms.  Dispense: 30 tablet; Refill: 0   -Advised icing, as needed NSAIDs, back stretches, local massage therapy. -If no improvement in 3 to 4 weeks, can consider referral to physical therapy. -He will try some bedtime muscle relaxers as needed and daily meloxicam for 10 days.   Time spent:22 minutes reviewing chart, interviewing and examining patient and formulating plan of care.     Chaya Jan, MD [facility]

## 2023-02-06 ENCOUNTER — Observation Stay (HOSPITAL_COMMUNITY): Payer: BC Managed Care – PPO

## 2023-02-06 ENCOUNTER — Encounter (HOSPITAL_COMMUNITY): Payer: Self-pay

## 2023-02-06 ENCOUNTER — Emergency Department (HOSPITAL_COMMUNITY): Payer: BC Managed Care – PPO

## 2023-02-06 ENCOUNTER — Telehealth: Payer: Self-pay | Admitting: Internal Medicine

## 2023-02-06 ENCOUNTER — Observation Stay (HOSPITAL_COMMUNITY)
Admission: EM | Admit: 2023-02-06 | Discharge: 2023-02-07 | Disposition: A | Discharge status: Home / Self Care | Payer: BC Managed Care – PPO | Attending: Cardiovascular Disease | Admitting: Cardiovascular Disease

## 2023-02-06 ENCOUNTER — Other Ambulatory Visit: Payer: Self-pay

## 2023-02-06 DIAGNOSIS — I200 Unstable angina: Secondary | ICD-10-CM

## 2023-02-06 DIAGNOSIS — I10 Essential (primary) hypertension: Secondary | ICD-10-CM | POA: Insufficient documentation

## 2023-02-06 DIAGNOSIS — R000 Tachycardia, unspecified: Secondary | ICD-10-CM | POA: Insufficient documentation

## 2023-02-06 DIAGNOSIS — E669 Obesity, unspecified: Secondary | ICD-10-CM | POA: Diagnosis not present

## 2023-02-06 DIAGNOSIS — R0789 Other chest pain: Secondary | ICD-10-CM | POA: Diagnosis not present

## 2023-02-06 DIAGNOSIS — Z79899 Other long term (current) drug therapy: Secondary | ICD-10-CM | POA: Insufficient documentation

## 2023-02-06 DIAGNOSIS — R079 Chest pain, unspecified: Principal | ICD-10-CM | POA: Insufficient documentation

## 2023-02-06 DIAGNOSIS — R0602 Shortness of breath: Secondary | ICD-10-CM | POA: Diagnosis not present

## 2023-02-06 DIAGNOSIS — Z96651 Presence of right artificial knee joint: Secondary | ICD-10-CM | POA: Insufficient documentation

## 2023-02-06 DIAGNOSIS — E782 Mixed hyperlipidemia: Secondary | ICD-10-CM

## 2023-02-06 DIAGNOSIS — I2510 Atherosclerotic heart disease of native coronary artery without angina pectoris: Secondary | ICD-10-CM | POA: Diagnosis not present

## 2023-02-06 LAB — COMPREHENSIVE METABOLIC PANEL
ALT: 35 U/L (ref 0–44)
AST: 31 U/L (ref 15–41)
Albumin: 3.4 g/dL — ABNORMAL LOW (ref 3.5–5.0)
Alkaline Phosphatase: 45 U/L (ref 38–126)
Anion gap: 9 (ref 5–15)
BUN: 10 mg/dL (ref 6–20)
CO2: 23 mmol/L (ref 22–32)
Calcium: 8.6 mg/dL — ABNORMAL LOW (ref 8.9–10.3)
Chloride: 104 mmol/L (ref 98–111)
Creatinine, Ser: 0.79 mg/dL (ref 0.61–1.24)
GFR, Estimated: >60 mL/min (ref >60)
Glucose, Bld: 137 mg/dL — ABNORMAL HIGH (ref 70–99)
Potassium: 3.5 mmol/L (ref 3.5–5.1)
Sodium: 136 mmol/L (ref 135–145)
Total Bilirubin: 0.9 mg/dL (ref 0.3–1.2)
Total Protein: 6.4 g/dL — ABNORMAL LOW (ref 6.5–8.1)

## 2023-02-06 LAB — HIV ANTIBODY (ROUTINE TESTING W REFLEX): HIV Screen 4th Generation wRfx: NONREACTIVE

## 2023-02-06 LAB — CBC
HCT: 44.5 % (ref 39.0–52.0)
HCT: 42.5 % (ref 39.0–52.0)
Hemoglobin: 14.2 g/dL (ref 13.0–17.0)
Hemoglobin: 13.6 g/dL (ref 13.0–17.0)
MCH: 29.1 pg (ref 26.0–34.0)
MCH: 28.3 pg (ref 26.0–34.0)
MCHC: 31.9 g/dL (ref 30.0–36.0)
MCHC: 32 g/dL (ref 30.0–36.0)
MCV: 91.2 fL (ref 80.0–100.0)
MCV: 88.4 fL (ref 80.0–100.0)
Platelets: 167 10*3/uL (ref 150–400)
Platelets: 198 10*3/uL (ref 150–400)
RBC: 4.88 MIL/uL (ref 4.22–5.81)
RBC: 4.81 MIL/uL (ref 4.22–5.81)
RDW: 13.9 % (ref 11.5–15.5)
RDW: 14 % (ref 11.5–15.5)
WBC: 6.6 10*3/uL (ref 4.0–10.5)
WBC: 6 10*3/uL (ref 4.0–10.5)
nRBC: 0 % (ref 0.0–0.2)
nRBC: 0 % (ref 0.0–0.2)

## 2023-02-06 LAB — BASIC METABOLIC PANEL
Anion gap: 9 (ref 5–15)
BUN: 9 mg/dL (ref 6–20)
CO2: 25 mmol/L (ref 22–32)
Calcium: 8.8 mg/dL — ABNORMAL LOW (ref 8.9–10.3)
Chloride: 104 mmol/L (ref 98–111)
Creatinine, Ser: 0.92 mg/dL (ref 0.61–1.24)
GFR, Estimated: >60 mL/min (ref >60)
Glucose, Bld: 93 mg/dL (ref 70–99)
Potassium: 4 mmol/L (ref 3.5–5.1)
Sodium: 138 mmol/L (ref 135–145)

## 2023-02-06 LAB — TSH: TSH: 2.102 u[IU]/mL (ref 0.350–4.500)

## 2023-02-06 LAB — TROPONIN I (HIGH SENSITIVITY)
Troponin I (High Sensitivity): 11 ng/L (ref <18)
Troponin I (High Sensitivity): 11 ng/L (ref <18)

## 2023-02-06 LAB — MAGNESIUM: Magnesium: 1.9 mg/dL (ref 1.7–2.4)

## 2023-02-06 MED ORDER — HEPARIN SODIUM (PORCINE) 5000 UNIT/ML IJ SOLN
5000.0000 [IU] | Freq: Three times a day (TID) | INTRAMUSCULAR | Status: DC
  Administered 2023-02-06 – 2023-02-07 (×2): 5000 [IU] via SUBCUTANEOUS
  Filled 2023-02-06 (×2): qty 1

## 2023-02-06 MED ORDER — ASPIRIN 81 MG PO CHEW
324.0000 mg | CHEWABLE_TABLET | Freq: Once | ORAL | Status: AC
  Administered 2023-02-06: 324 mg via ORAL
  Filled 2023-02-06: qty 4

## 2023-02-06 MED ORDER — ATORVASTATIN CALCIUM 10 MG PO TABS
20.0000 mg | ORAL_TABLET | Freq: Every day | ORAL | Status: DC
  Administered 2023-02-07: 20 mg via ORAL
  Filled 2023-02-06: qty 2

## 2023-02-06 MED ORDER — ASPIRIN 81 MG PO TBEC
81.0000 mg | DELAYED_RELEASE_TABLET | Freq: Every day | ORAL | Status: DC
  Administered 2023-02-07: 81 mg via ORAL
  Filled 2023-02-06: qty 1

## 2023-02-06 MED ORDER — METOPROLOL TARTRATE 25 MG PO TABS
25.0000 mg | ORAL_TABLET | ORAL | Status: DC
  Filled 2023-02-06: qty 1

## 2023-02-06 MED ORDER — NITROGLYCERIN 0.4 MG SL SUBL: 0.4000 mg | SUBLINGUAL_TABLET | SUBLINGUAL | Status: DC | PRN

## 2023-02-06 MED ORDER — ONDANSETRON HCL 4 MG/2ML IJ SOLN: 4.0000 mg | Freq: Four times a day (QID) | INTRAMUSCULAR | Status: DC | PRN

## 2023-02-06 MED ORDER — ACETAMINOPHEN 325 MG PO TABS: 650.0000 mg | ORAL_TABLET | ORAL | Status: DC | PRN

## 2023-02-06 NOTE — Telephone Encounter (Signed)
Patient has arrived at the ED.

## 2023-02-06 NOTE — Telephone Encounter (Signed)
Patient was advised to go the ED per Dr Ardyth Harps. Patient verbally agreed. Awaiting patient's arrival

## 2023-02-06 NOTE — ED Notes (Signed)
ED TO INPATIENT HANDOFF REPORT  ED Nurse Name and Phone #: Kathryne Hitch [phone]  S Name/Age/Gender Shane [name] 57 y.o. male Room/Bed: RESUSC/RESUSC  Code Status   Code Status: Full Code  Home/SNF/Other Home Patient oriented to: self, place, time, and situation Is this baseline? Yes   Triage Complete: Triage complete  Chief Complaint Unstable angina (HCC) [I20.0]  Triage Note Reports was at work and developed chest tightness and mild shortness of breath.  Reports the tightness has let up.  Reports no radiation of pain or nausea. Reports he felt over the weekend his heart was racing.    Allergies Allergies  Allergen Reactions   Penicillins Cross Reactors Rash    Level of Care/Admitting Diagnosis ED Disposition     ED Disposition  Admit   Condition  --   Comment  Hospital Area: [facility] [[id number]]  Level of Care: Telemetry Cardiac [103]  May place patient in observation at [facility] or [facility] if equivalent level of care is available:: No  Covid Evaluation: Asymptomatic - no recent exposure (last 10 days) testing not required  Diagnosis: Unstable angina [facility]) [[id number]]  Admitting Physician: Wendall Stade [[id number]]  Attending Physician: Wendall Stade [[id number]]          B Medical/Surgery History Past Medical History:  Diagnosis Date   Chronic knee pain    GERD (gastroesophageal reflux disease)    Left shoulder pain    Obesity    Past Surgical History:  Procedure Laterality Date   carpal pummel repair Bilateral    CERVICAL DISC SURGERY  2003   per Dr. Newell Coral    COLONOSCOPY  05/10/2018   per Dr. Lavon Paganini, internal hemorrhoids and sessile serrated polyps, repeat in 3 yrs    KNEE SURGERY Right      A IV Location/Drains/Wounds Patient Lines/Drains/Airways Status     Active Line/Drains/Airways     Name Placement date Placement time Site Days   Peripheral IV 02/06/23 20 G Anterior;Distal;Right;Upper Arm 02/06/23  1323  Arm   less than 1   Peripheral IV 02/06/23 18 G 1.88" Left Antecubital 02/06/23  1858  Antecubital  less than 1            Intake/Output Last 24 hours No intake or output data in the 24 hours ending 02/06/23 1924  Labs/Imaging Results for orders placed or performed during the hospital encounter of 02/06/23 (from the past 48 hour(s))  Basic metabolic panel     Status: Abnormal   Collection Time: 02/06/23 11:45 AM  Result Value Ref Range   Sodium 138 135 - 145 mmol/L   Potassium 4.0 3.5 - 5.1 mmol/L   Chloride 104 98 - 111 mmol/L   CO2 25 22 - 32 mmol/L   Glucose, Bld 93 70 - 99 mg/dL    Comment: Glucose reference range applies only to samples taken after fasting for at least 8 hours.   BUN 9 6 - 20 mg/dL   Creatinine, Ser 6.96 0.61 - 1.24 mg/dL   Calcium 8.8 (L) 8.9 - 10.3 mg/dL   GFR, Estimated >78 >93 mL/min    Comment: (NOTE) Calculated using the CKD-EPI Creatinine Equation (2021)    Anion gap 9 5 - 15    Comment: Performed at [facility], [address] [place], [place] [address]  CBC     Status: None   Collection Time: 02/06/23 11:45 AM  Result Value Ref Range   WBC 6.6 4.0 - 10.5 K/uL  RBC 4.88 4.22 - 5.81 MIL/uL   Hemoglobin 14.2 13.0 - 17.0 g/dL   HCT 09.8 11.9 - 14.7 %   MCV 91.2 80.0 - 100.0 fL   MCH 29.1 26.0 - 34.0 pg   MCHC 31.9 30.0 - 36.0 g/dL   RDW 82.9 56.2 - 13.0 %   Platelets 167 150 - 400 K/uL   nRBC 0.0 0.0 - 0.2 %    Comment: Performed at [facility], [address], [place], [place] [address]  Troponin I (High Sensitivity)     Status: None   Collection Time: 02/06/23 11:45 AM  Result Value Ref Range   Troponin I (High Sensitivity) 11 <18 ng/L    Comment: (NOTE) Elevated high sensitivity troponin I (hsTnI) values and significant  changes across serial measurements may suggest ACS but many other  chronic and acute conditions are known to elevate hsTnI results.  Refer to the "Links" section for chest pain algorithms and additional   guidance. Performed at [facility], [address], [place], [place] [address]   Troponin I (High Sensitivity)     Status: None   Collection Time: 02/06/23  1:25 PM  Result Value Ref Range   Troponin I (High Sensitivity) 11 <18 ng/L    Comment: (NOTE) Elevated high sensitivity troponin I (hsTnI) values and significant  changes across serial measurements may suggest ACS but many other  chronic and acute conditions are known to elevate hsTnI results.  Refer to the "Links" section for chest pain algorithms and additional  guidance. Performed at [facility], [address], [place], [place] [address]    DG Chest 2 View  Result Date: 02/06/2023 CLINICAL DATA:  Chest pain and shortness of breath. EXAM: CHEST - 2 VIEW COMPARISON:  Chest radiographs 12/08/2022 and chest CTA 12/15/2022 FINDINGS: The cardiomediastinal silhouette is unchanged with normal heart size. Chronic appearing coarsening of the interstitial markings is unchanged from the prior radiographs. No overt pulmonary edema, airspace consolidation, pleural effusion, or pneumothorax is identified. No acute osseous abnormality is seen. IMPRESSION: No active cardiopulmonary disease. Electronically Signed   By: Sebastian Ache M.D.   On: 02/06/2023 12:56    Pending Labs [employer] (From admission, onward)     Start     Ordered   Signed and Held  HIV Antibody (routine testing w rflx)  (HIV Antibody (Routine testing w reflex) panel)  Once,   R        Signed and Held   Signed and Held  Lipoprotein A (LPA)  Tomorrow morning,   R        Signed and Held   Signed and Held  CBC  (heparin)  Once,   R       Comments: Baseline for heparin therapy IF NOT ALREADY DRAWN.  Notify MD if PLT < 100 K.    Signed and Held   Signed and Held  Creatinine, serum  (heparin)  Once,   R       Comments: Baseline for heparin therapy IF NOT ALREADY DRAWN.    Signed and Held   Signed and Held  TSH  Once,   R        Signed and Held   Signed  and Held  Comprehensive metabolic panel  Once,   R        Signed and Held   Signed and Held  Magnesium  Once,   R        Signed  and Held   Signed and Held  Hemoglobin A1c  Once,   R        Signed and Held   Signed and Held  Basic metabolic panel  Tomorrow morning,   R        Signed and Held   Signed and Held  Lipid panel  Tomorrow morning,   R        Signed and Held   Signed and Held  CBC  Tomorrow morning,   R        Signed and Held            Vitals/Pain Today's Vitals   02/06/23 1645 02/06/23 1658 02/06/23 1700 02/06/23 1715  BP: 138/75  127/74 117/77  Pulse: 63  (!) 55 61  Resp: 17  18 14   Temp:  97.8 F (36.6 C)    TempSrc:  Oral    SpO2: 99%  98% 97%  Weight:      Height:      PainSc:        Isolation Precautions No active isolations  Medications Medications  metoprolol tartrate (LOPRESSOR) tablet 25 mg (has no administration in time range)  aspirin chewable tablet 324 mg (324 mg Oral Given 02/06/23 1337)    Mobility walks     Focused Assessments Cardiac Assessment Handoff:  Cardiac Rhythm: Normal sinus rhythm No results found for: "CKTOTAL", "CKMB", "CKMBINDEX", "TROPONINI" Lab Results  Component Value Date   DDIMER <0.27 09/09/2013   Does the Patient currently have chest pain? No    R Recommendations: See Admitting Provider Note  Report given to:   Additional Notes:

## 2023-02-06 NOTE — ED Provider Notes (Signed)
[place] EMERGENCY DEPARTMENT AT [facility] Provider Note   CSN: [id number] Arrival date & time: 02/06/23  1113     History {Add pertinent medical, surgical, social history, OB history to HPI:1} Chief Complaint  Patient presents with  . Chest Pain    Shane [name] is a 57 y.o. male.  HPI 57 year old male presents today complaining of substernal chest pain.  He reports that he has had some intermittent chest discomfort over the past 2 months.  Today while he was working he began having substernal chest pain that was heavy in nature.  He was diaphoretic but feels this may have been secondary to ambient temperature.  He states that he did stop working and sit around.  It essentially resolved after 30 minutes.  Pain was 5-6 out of 10 and is currently 0 out of 10.  He denies any lightheadedness, nausea, vomiting, abdominal pain.     Home Medications Prior to Admission medications   Medication Sig Start Date End Date Taking? Authorizing Provider  atorvastatin (LIPITOR) 20 MG tablet Take 1 tablet (20 mg total) by mouth daily. 02/04/21   Philip Aspen, Limmie Patricia, MD  cyclobenzaprine (FLEXERIL) 5 MG tablet Take 1 tablet (5 mg total) by mouth at bedtime as needed for muscle spasms. 01/25/23   Philip Aspen, Limmie Patricia, MD  meloxicam (MOBIC) 7.5 MG tablet Take 1 tablet (7.5 mg total) by mouth daily. 01/25/23   Philip Aspen, Limmie Patricia, MD      Allergies    Penicillins cross reactors    Review of Systems   Review of Systems  Physical Exam Updated Vital Signs BP 128/74   Pulse (!) 57   Temp 98.4 F (36.9 C) (Oral)   Resp 16   Ht 1.778 m (5\' 10" )   Wt 127.9 kg   SpO2 100%   BMI 40.46 kg/m  Physical Exam Vitals reviewed.  Constitutional:      Appearance: He is normal weight.  HENT:     Head: Normocephalic.  Cardiovascular:     Rate and Rhythm: Normal rate and regular rhythm.     Heart sounds: Normal heart sounds.  Pulmonary:     Effort: Pulmonary effort  is normal.     Breath sounds: Normal breath sounds.  Abdominal:     General: Bowel sounds are normal.     Palpations: Abdomen is soft.  Musculoskeletal:        General: Normal range of motion.     Cervical back: Normal range of motion.  Skin:    General: Skin is warm.     Capillary Refill: Capillary refill takes less than 2 seconds.  Neurological:     General: No focal deficit present.     Mental Status: He is alert.    ED Results / Procedures / Treatments   Labs (all labs ordered are listed, but only abnormal results are displayed) Labs Reviewed  BASIC METABOLIC PANEL - Abnormal; Notable for the following components:      Result Value   Calcium 8.8 (*)    All other components within normal limits  CBC  TROPONIN I (HIGH SENSITIVITY)  TROPONIN I (HIGH SENSITIVITY)    EKG EKG Interpretation Date/Time:  Monday February 06 2023 11:09:12 EDT Ventricular Rate:  66 PR Interval:  190 QRS Duration:  102 QT Interval:  404 QTC Calculation: 423 R Axis:   8  Text Interpretation: Normal sinus rhythm Minimal voltage criteria for LVH, may be normal variant ( R in  aVL ) T wave abnormality, consider inferior ischemia Abnormal ECG When compared with ECG of 08-Dec-2022 22:10, PREVIOUS ECG IS PRESENT new q waves inerior leads Confirmed by Margarita Grizzle [phone]) on 02/06/2023 1:12:49 PM  Radiology DG Chest 2 View  Result Date: 02/06/2023 CLINICAL DATA:  Chest pain and shortness of breath. EXAM: CHEST - 2 VIEW COMPARISON:  Chest radiographs 12/08/2022 and chest CTA 12/15/2022 FINDINGS: The cardiomediastinal silhouette is unchanged with normal heart size. Chronic appearing coarsening of the interstitial markings is unchanged from the prior radiographs. No overt pulmonary edema, airspace consolidation, pleural effusion, or pneumothorax is identified. No acute osseous abnormality is seen. IMPRESSION: No active cardiopulmonary disease. Electronically Signed   By: Sebastian Ache M.D.   On: 02/06/2023 12:56     Procedures Procedures  {Document cardiac monitor, telemetry assessment procedure when appropriate:1}  Medications Ordered in ED Medications - No data to display  ED Course/ Medical Decision Making/ A&P Clinical Course as of 02/06/23 1324  Mon Feb 06, 2023  1322 Initial troponin reviewed interpreted significant for 11 [DR]  1323 Basic metabolic panel reviewed interpreted within normal limits CBC reviewed interpreted within normal limits [DR]  1323 Chest x-Hassell Patras reviewed interpreted no acute disease noted radiologist interpretation concurs [DR]    Clinical Course User Index [DR] Margarita Grizzle, MD   {   Click here for ABCD2, HEART and other calculatorsREFRESH Note before signing :1}          HEART Score: 5                Medical Decision Making Amount and/or Complexity of Data Reviewed Labs: ordered. Radiology: ordered.  Risk OTC drugs.  Discussed with cardiology and they will see in consultation Heart score 5 Patient evaluated in ed for chest pain DDX- ACS PE Infection Dissection Upper abdominal etiology   {Document critical care time when appropriate:1} {Document review of labs and clinical decision tools ie heart score, Chads2Vasc2 etc:1}  {Document your independent review of radiology images, and any outside records:1} {Document your discussion with family members, caretakers, and with consultants:1} {Document social determinants of health affecting pt's care:1} {Document your decision making why or why not admission, treatments were needed:1} Final Clinical Impression(s) / ED Diagnoses Final diagnoses:  None    Rx / DC Orders ED Discharge Orders     None

## 2023-02-06 NOTE — ED Notes (Signed)
ED TO INPATIENT HANDOFF REPORT  ED Nurse Name and Phone #: Kathryne Hitch [phone]  S Name/Age/Gender Shane [name] 57 y.o. male Room/Bed: RESUSC/RESUSC  Code Status   Code Status: Full Code  Home/SNF/Other Home Patient oriented to: self, place, time, and situation Is this baseline? Yes   Triage Complete: Triage complete  Chief Complaint Unstable angina (HCC) [I20.0]  Triage Note Reports was at work and developed chest tightness and mild shortness of breath.  Reports the tightness has let up.  Reports no radiation of pain or nausea. Reports he felt over the weekend his heart was racing.    Allergies Allergies  Allergen Reactions   Penicillins Cross Reactors Rash    Level of Care/Admitting Diagnosis ED Disposition     ED Disposition  Admit   Condition  --   Comment  H[facility] [[id number]]  Level of Care: Telemetry Cardiac [103]  May place patient in observation at [facility] or [facility] if equivalent level of care is available:: No  Covid Evaluation: Asymptomatic - no recent exposure (last 10 days) testing not required  Diagnosis: Unstable angina [facility]) [[id number]]  Admitting Physician: Wendall Stade [[id number]]  Attending Physician: Wendall Stade [[id number]]          B Medical/Surgery History Past Medical History:  Diagnosis Date   Chronic knee pain    GERD (gastroesophageal reflux disease)    Left shoulder pain    Obesity    Past Surgical History:  Procedure Laterality Date   carpal pummel repair Bilateral    CERVICAL DISC SURGERY  2003   per Dr. Newell Coral    COLONOSCOPY  05/10/2018   per Dr. Lavon Paganini, internal hemorrhoids and sessile serrated polyps, repeat in 3 yrs    KNEE SURGERY Right      A IV Location/Drains/Wounds Patient Lines/Drains/Airways Status     Active Line/Drains/Airways     Name Placement date Placement time Site Days   Peripheral IV 02/06/23 20 G Anterior;Distal;Right;Upper Arm 02/06/23  1323  Arm   less than 1   Peripheral IV 02/06/23 18 G 1.88" Left Antecubital 02/06/23  1858  Antecubital  less than 1            Intake/Output Last 24 hours No intake or output data in the 24 hours ending 02/06/23 1920  Labs/Imaging Results for orders placed or performed during the hospital encounter of 02/06/23 (from the past 48 hour(s))  Basic metabolic panel     Status: Abnormal   Collection Time: 02/06/23 11:45 AM  Result Value Ref Range   Sodium 138 135 - 145 mmol/L   Potassium 4.0 3.5 - 5.1 mmol/L   Chloride 104 98 - 111 mmol/L   CO2 25 22 - 32 mmol/L   Glucose, Bld 93 70 - 99 mg/dL    Comment: Glucose reference range applies only to samples taken after fasting for at least 8 hours.   BUN 9 6 - 20 mg/dL   Creatinine, Ser 1.19 0.61 - 1.24 mg/dL   Calcium 8.8 (L) 8.9 - 10.3 mg/dL   GFR, Estimated >14 >78 mL/min    Comment: (NOTE) Calculated using the CKD-EPI Creatinine Equation (2021)    Anion gap 9 5 - 15    Comment: Performed at [facility], [address], [place], [place] [address]  CBC     Status: None   Collection Time: 02/06/23 11:45 AM  Result Value Ref Range   WBC 6.6 4.0 - 10.5 K/uL  RBC 4.88 4.22 - 5.81 MIL/uL   Hemoglobin 14.2 13.0 - 17.0 g/dL   HCT 57.8 46.9 - 62.9 %   MCV 91.2 80.0 - 100.0 fL   MCH 29.1 26.0 - 34.0 pg   MCHC 31.9 30.0 - 36.0 g/dL   RDW 52.8 41.3 - 24.4 %   Platelets 167 150 - 400 K/uL   nRBC 0.0 0.0 - 0.2 %    Comment: Performed at [facility], [address] [place], [place] [address]  Troponin I (High Sensitivity)     Status: None   Collection Time: 02/06/23 11:45 AM  Result Value Ref Range   Troponin I (High Sensitivity) 11 <18 ng/L    Comment: (NOTE) Elevated high sensitivity troponin I (hsTnI) values and significant  changes across serial measurements may suggest ACS but many other  chronic and acute conditions are known to elevate hsTnI results.  Refer to the "Links" section for chest pain algorithms and additional   guidance. Performed at [facility], [address], [place], [place] [address]   Troponin I (High Sensitivity)     Status: None   Collection Time: 02/06/23  1:25 PM  Result Value Ref Range   Troponin I (High Sensitivity) 11 <18 ng/L    Comment: (NOTE) Elevated high sensitivity troponin I (hsTnI) values and significant  changes across serial measurements may suggest ACS but many other  chronic and acute conditions are known to elevate hsTnI results.  Refer to the "Links" section for chest pain algorithms and additional  guidance. Performed at [facility], [address], [place], [place] [address]    DG Chest 2 View  Result Date: 02/06/2023 CLINICAL DATA:  Chest pain and shortness of breath. EXAM: CHEST - 2 VIEW COMPARISON:  Chest radiographs 12/08/2022 and chest CTA 12/15/2022 FINDINGS: The cardiomediastinal silhouette is unchanged with normal heart size. Chronic appearing coarsening of the interstitial markings is unchanged from the prior radiographs. No overt pulmonary edema, airspace consolidation, pleural effusion, or pneumothorax is identified. No acute osseous abnormality is seen. IMPRESSION: No active cardiopulmonary disease. Electronically Signed   By: Sebastian Ache M.D.   On: 02/06/2023 12:56    Pending Labs [employer] (From admission, onward)     Start     Ordered   Signed and Held  HIV Antibody (routine testing w rflx)  (HIV Antibody (Routine testing w reflex) panel)  Once,   R        Signed and Held   Signed and Held  Lipoprotein A (LPA)  Tomorrow morning,   R        Signed and Held   Signed and Held  CBC  (heparin)  Once,   R       Comments: Baseline for heparin therapy IF NOT ALREADY DRAWN.  Notify MD if PLT < 100 K.    Signed and Held   Signed and Held  Creatinine, serum  (heparin)  Once,   R       Comments: Baseline for heparin therapy IF NOT ALREADY DRAWN.    Signed and Held   Signed and Held  TSH  Once,   R        Signed and Held   Signed  and Held  Comprehensive metabolic panel  Once,   R        Signed and Held   Signed and Held  Magnesium  Once,   R        Signed  and Held   Signed and Held  Hemoglobin A1c  Once,   R        Signed and Held   Signed and Held  Basic metabolic panel  Tomorrow morning,   R        Signed and Held   Signed and Held  Lipid panel  Tomorrow morning,   R        Signed and Held   Signed and Held  CBC  Tomorrow morning,   R        Signed and Held            Vitals/Pain Today's Vitals   02/06/23 1645 02/06/23 1658 02/06/23 1700 02/06/23 1715  BP: 138/75  127/74 117/77  Pulse: 63  (!) 55 61  Resp: 17  18 14   Temp:  97.8 F (36.6 C)    TempSrc:  Oral    SpO2: 99%  98% 97%  Weight:      Height:      PainSc:        Isolation Precautions No active isolations  Medications Medications  metoprolol tartrate (LOPRESSOR) tablet 25 mg (has no administration in time range)  aspirin chewable tablet 324 mg (324 mg Oral Given 02/06/23 1337)    Mobility walks     Focused Assessments Cardiac Assessment Handoff:  Cardiac Rhythm: Normal sinus rhythm No results found for: "CKTOTAL", "CKMB", "CKMBINDEX", "TROPONINI" Lab Results  Component Value Date   DDIMER <0.27 09/09/2013   Does the Patient currently have chest pain? No    R Recommendations: See Admitting Provider Note  Report given to:   Additional Notes:

## 2023-02-06 NOTE — H&P (Signed)
Cardiology History and Physical   Patient ID: Shane [name] MRN: [id number]; DOB: 1966-01-08  Admit date: 02/06/2023 Date of Consult: 02/06/2023  PCP:  Philip Aspen, Limmie Patricia, MD   [facility] Providers Cardiologist:  Shane Poisson, MD   Patient Profile:   Shane [name] is a 57 y.o. male with a hx of GERD, obesity, and hyperlipidemia who is being seen 02/06/2023 for the evaluation of chest pain at the request of Dr. Rosalia Hammers.  History of Present Illness:   Shane [name] was seen by cardiology 2015 for weakness, palpitations, and dizziness. Nuclear stress test was nonischemic. Heart monitor did not show any arrhythmias.  He has a family history of heart disease. He has not followed with cardiology since that time.   He was seen in the ER 12/08/2022 for lower extremity swelling, chest pressure, and shortness of breath.  CTA was negative for PE but showed   He presented to [facility] today with intermittent chest pain over th last 2 months. HS troponin x 2 negative. EKG with TWI inferior leads, seen in 2021.  Cardiology was consulted. During my interview he describes intermittent rapid heart rate over the weekend.  Generally these episodes happen every few months but only last less than a minute.  This past weekend, heart racing lasted 10 to 15 minutes.  This occurred at rest.  He does not typically get chest pain.  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  This morning at 830-9 AM he experienced sudden onset of substernal chest pain.  Chest pain did not radiate but felt like a pressure.  He was already diaphoretic from working outside but also became short of breath, no nausea or vomiting.  This prompted ER evaluation.  He is currently resting comfortably without chest discomfort or other symptoms.  He does get yearly physicals.  He denies prior history of diabetes and he is a never smoker, does not drink alcohol, no illicit drug use.  His wife is at  bedside.  He does describe that his mother has a history of heart surgery involving valve repair.  His sister also had open heart surgery in her early 71s, but is unclear if this was related to valve or CAD.   Past Medical History:  Diagnosis Date   Chronic knee pain    GERD (gastroesophageal reflux disease)    Left shoulder pain    Obesity     Past Surgical History:  Procedure Laterality Date   carpal pummel repair Bilateral    CERVICAL DISC SURGERY  2003   per Dr. Newell Coral    COLONOSCOPY  05/10/2018   per Dr. Lavon Paganini, internal hemorrhoids and sessile serrated polyps, repeat in 3 yrs    KNEE SURGERY Right      Home Medications:  Prior to Admission medications   Medication Sig Start Date End Date Taking? Authorizing Provider  atorvastatin (LIPITOR) 20 MG tablet Take 1 tablet (20 mg total) by mouth daily. 02/04/21   Philip Aspen, Limmie Patricia, MD  cyclobenzaprine (FLEXERIL) 5 MG tablet Take 1 tablet (5 mg total) by mouth at bedtime as needed for muscle spasms. 01/25/23   Philip Aspen, Limmie Patricia, MD  meloxicam (MOBIC) 7.5 MG tablet Take 1 tablet (7.5 mg total) by mouth daily. 01/25/23   Henderson Cloud, MD    Inpatient Medications: Scheduled Meds:  Continuous Infusions:  PRN Meds:   Allergies:    Allergies  Allergen Reactions   Penicillins Cross Reactors Rash  Social History:   Social History   Socioeconomic History   Marital status: Married    Spouse name: Not on file   Number of children: 3   Years of education: Not on file   Highest education level: Not on file  Occupational History   Occupation: [employer]   Tobacco Use   Smoking status: Never   Smokeless tobacco: Never  Vaping Use   Vaping status: Never Used  Substance and Sexual Activity   Alcohol use: No    Alcohol/week: 0.0 standard drinks of alcohol   Drug use: No   Sexual activity: Not on file  Other Topics Concern   Not on file  Social History Narrative   Not on file    Social Determinants of Health   Financial Resource Strain: Not on file  Food Insecurity: Not on file  Transportation Needs: Not on file  Physical Activity: Not on file  Stress: Not on file  Social Connections: Unknown (11/22/2021)   Received from [facility]   Social Network    Social Network: Not on file  Intimate Partner Violence: Unknown (10/14/2021)   Received from [facility]   HITS    Physically Hurt: Not on file    Insult or Talk Down To: Not on file    Threaten Physical Harm: Not on file    Scream or Curse: Not on file    Family History:    Family History  Problem Relation Age of Onset   Heart disease Mother        mitrial valve repair, CABG    Prostate cancer Father    Hypertension Father    Colon cancer Father        in his early 20's   Esophageal cancer Neg Hx    Rectal cancer Neg Hx    Stomach cancer Neg Hx      ROS:  Please see the history of present illness.   All other ROS reviewed and negative.     Physical Exam/Data:   Vitals:   02/06/23 1300 02/06/23 1315 02/06/23 1345 02/06/23 1430  BP: 126/78 128/74  (!) 138/91  Pulse: (!) 57 (!) 57 61 (!) 51  Resp: 18 16 17 15   Temp:      TempSrc:      SpO2: 94% 100% 98% 96%  Weight:      Height:       No intake or output data in the 24 hours ending 02/06/23 1637    02/06/2023   11:35 AM 01/25/2023    9:15 AM 12/13/2022    3:48 PM  Last 3 Weights  Weight (lbs) 282 lb 282 lb 14.4 oz 287 lb  Weight (kg) 127.914 kg 128.323 kg 130.182 kg     Body mass index is 40.46 kg/m.  General:  Well nourished, well developed, in no acute distress HEENT: normal Neck: no JVD Vascular: No carotid bruits; Distal pulses 2+ bilaterally Cardiac:  normal S1, S2; RRR; no murmur  Lungs:  clear to auscultation bilaterally, no wheezing, rhonchi or rales  Abd: soft, nontender, no hepatomegaly  Ext: no edema Musculoskeletal:  No deformities, BUE and BLE strength normal and equal Skin: warm and dry  Neuro:  CNs 2-12  intact, no focal abnormalities noted Psych:  Normal affect   EKG:  The EKG was personally reviewed and demonstrates:  sinus rhythm with 55, TWI AVR, III, AVF Telemetry:  Telemetry was personally reviewed and demonstrates:  N/A  Relevant CV Studies:  none  Laboratory Data:  High Sensitivity Troponin:   Recent Labs  Lab 02/06/23 1145 02/06/23 1325  TROPONINIHS 11 11     Chemistry Recent Labs  Lab 02/06/23 1145  NA 138  K 4.0  CL 104  CO2 25  GLUCOSE 93  BUN 9  CREATININE 0.92  CALCIUM 8.8*  GFRNONAA >60  ANIONGAP 9    No results for input(s): "PROT", "ALBUMIN", "AST", "ALT", "ALKPHOS", "BILITOT" in the last 168 hours. Lipids No results for input(s): "CHOL", "TRIG", "HDL", "LABVLDL", "LDLCALC", "CHOLHDL" in the last 168 hours.  Hematology Recent Labs  Lab 02/06/23 1145  WBC 6.6  RBC 4.88  HGB 14.2  HCT 44.5  MCV 91.2  MCH 29.1  MCHC 31.9  RDW 13.9  PLT 167   Thyroid No results for input(s): "TSH", "FREET4" in the last 168 hours.  BNPNo results for input(s): "BNP", "PROBNP" in the last 168 hours.  DDimer No results for input(s): "DDIMER" in the last 168 hours.   Radiology/Studies:  DG Chest 2 View  Result Date: 02/06/2023 CLINICAL DATA:  Chest pain and shortness of breath. EXAM: CHEST - 2 VIEW COMPARISON:  Chest radiographs 12/08/2022 and chest CTA 12/15/2022 FINDINGS: The cardiomediastinal silhouette is unchanged with normal heart size. Chronic appearing coarsening of the interstitial markings is unchanged from the prior radiographs. No overt pulmonary edema, airspace consolidation, pleural effusion, or pneumothorax is identified. No acute osseous abnormality is seen. IMPRESSION: No active cardiopulmonary disease. Electronically Signed   By: Sebastian Ache M.D.   On: 02/06/2023 12:56     Assessment and Plan:   Chest pain - hs troponin x 2 negative - EKG TWI inferior leads, seen in 2021 - CT chest with minimal coronary artery calcification - will plan for  CT coronary tomorrow - obtain echocardiogram - check A1c - no heparin with negative troponin - will hold off on BB, HR is in the 50s   Heart racing - monitor on telemetry - will check electrolytes and thyroid function - echo as above   Hyperlipidemia - diagnosed by PCP, but he did not take the statin prescribed - obtain lipid panel and LP(a)   Plan to admit to cardiology overnight and CT coronary and echo tomorrow.   Risk Assessment/Risk Scores:     TIMI Risk Score for Unstable Angina or Non-ST Elevation MI:   The patient's TIMI risk score is 0, which indicates a 5% risk of all cause mortality, new or recurrent myocardial infarction or need for urgent revascularization in the next 14 days.       For questions or updates, please contact [facility] Please consult [email or web address] for contact info under    Signed, Marcelino Duster, [place]  02/06/2023 4:37 PM

## 2023-02-06 NOTE — Telephone Encounter (Signed)
Pt walked in this morning wanting to talk with Dr Ardyth Harps regarding chest tightness and some pain.

## 2023-02-06 NOTE — ED Triage Notes (Signed)
Reports was at work and developed chest tightness and mild shortness of breath.  Reports the tightness has let up.  Reports no radiation of pain or nausea. Reports he felt over the weekend his heart was racing.

## 2023-02-07 ENCOUNTER — Telehealth (HOSPITAL_COMMUNITY): Payer: Self-pay | Admitting: Emergency Medicine

## 2023-02-07 ENCOUNTER — Observation Stay (HOSPITAL_BASED_OUTPATIENT_CLINIC_OR_DEPARTMENT_OTHER): Payer: BC Managed Care – PPO

## 2023-02-07 DIAGNOSIS — R072 Precordial pain: Secondary | ICD-10-CM | POA: Diagnosis not present

## 2023-02-07 DIAGNOSIS — R079 Chest pain, unspecified: Secondary | ICD-10-CM | POA: Diagnosis not present

## 2023-02-07 MED ORDER — NITROGLYCERIN 0.4 MG SL SUBL
SUBLINGUAL_TABLET | SUBLINGUAL | Status: AC
  Filled 2023-02-07: qty 2

## 2023-02-07 MED ORDER — PERFLUTREN LIPID MICROSPHERE
1.0000 mL | INTRAVENOUS | Status: AC | PRN
  Administered 2023-02-07: 2 mL via INTRAVENOUS

## 2023-02-07 MED ORDER — NITROGLYCERIN 0.4 MG SL SUBL: 0.4000 mg | SUBLINGUAL_TABLET | SUBLINGUAL | 1 refills | Status: AC | PRN

## 2023-02-07 MED ORDER — IOHEXOL 350 MG/ML SOLN
95.0000 mL | Freq: Once | INTRAVENOUS | Status: AC | PRN
  Administered 2023-02-07: 95 mL via INTRAVENOUS

## 2023-02-07 MED ORDER — ATORVASTATIN CALCIUM 20 MG PO TABS: 20.0000 mg | ORAL_TABLET | Freq: Every day | ORAL | 3 refills | Status: DC

## 2023-02-07 MED ORDER — ASPIRIN 81 MG PO TBEC: 81.0000 mg | DELAYED_RELEASE_TABLET | Freq: Every day | ORAL | 3 refills | Status: AC

## 2023-02-07 NOTE — Progress Notes (Signed)
Patient seen yesterday evening in ER [facility] with negative troponin and non specific ST changes  Plan is for cardiac CTA HR is low 25 mg lopressor 2 hours before scan TTE pending patient is obese and will likely need to be scanned 120 KV CT navigator notified as I am off this afternoon and will not be able to read scan Discussed with patient and wife at bedside last night No dye allergy  Charlton Haws MD [facility]

## 2023-02-07 NOTE — Discharge Summary (Signed)
Discharge Summary    Patient ID: Shane [name] MRN: [id number]; DOB: [birth year]  Admit date: 02/06/2023 Discharge date: 02/07/2023  PCP:  Shane [name], Shane Patricia, MD   [facility] Providers Cardiologist:  Shane Haws, MD   {   Discharge Diagnoses    Active Problems:   Chest pain of uncertain etiology   Essential hypertension    Diagnostic Studies/Procedures    Echo from today showed    1. Left ventricular ejection fraction, by estimation, is 60 to 65%. The  left ventricle has normal function. The left ventricle has no regional  wall motion abnormalities. Left ventricular diastolic parameters are  indeterminate.   2. Right ventricular systolic function is normal. The right ventricular  size is normal.   3. The mitral valve is normal in structure. No evidence of mitral valve  regurgitation. No evidence of mitral stenosis.   4. The aortic valve is normal in structure. Aortic valve regurgitation is  not visualized. No aortic stenosis is present.   5. The inferior vena cava is dilated in size with >50% respiratory  variability, suggesting right atrial pressure of 8 mmHg.     CT coronary study today showed   1. Coronary calcium score of 43. This was 28 percentile for age and sex matched control.   2. Total plaque volume (TPV) 150 mm3 which is 38th percentile for age-and sex matched controls (calcified plaque 12 mm3; non-calcified plaque 138 mm3). TPV is mild.   3. Normal coronary origin with LEFT dominance.   4. Mild scattered calcified mid LAD, 0-24%.     History of Present Illness     Per admission H&P on 02/06/23 :  Shane [name] is a 57 y.o. male with a hx of GERD, obesity, and hyperlipidemia who was seen 02/06/2023 for the evaluation of chest pain.   [name] was seen by cardiology 2015 for weakness, palpitations, and dizziness. Nuclear stress test was nonischemic. Heart monitor did not show any arrhythmias.  He has a family history  of heart disease. He has not followed with cardiology since that time.    He was seen in the ER 12/08/2022 for lower extremity swelling, chest pressure, and shortness of breath.  CTA was negative for PE.    He presented to [facility] today with intermittent chest pain over th last 2 months. HS troponin x 2 negative. EKG with TWI inferior leads, seen in 2021.   Cardiology was consulted. During my interview he described intermittent rapid heart rate over the weekend.  Generally these episodes happened every few months but only last less than a minute.  This past weekend, heart racing lasted 10 to 15 minutes.  This occurred at rest.  He does not typically get chest pain.  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  02/06/23 morning at 830-9 AM he experienced sudden onset of substernal chest pain.  Chest pain did not radiate but felt like a pressure.  He was already diaphoretic from working outside but also became short of breath, no nausea or vomiting.  This prompted ER evaluation.  He was resting comfortably without chest discomfort or other symptoms during encounter initially.  He does get yearly physicals.  He denied prior history of diabetes and he is a never smoker, does not drink alcohol, no illicit drug use.  His wife was at bedside.   He does describe that his mother has a history of heart surgery involving valve repair.  His sister also had  open heart surgery in her early 90s, but is unclear if this was related to valve or CAD.   Hospital Course     Consultants: N/A   Chest pain Mild non-obstructive CAD  - presented with chest pain - Hs trop negative x 2 -EKG without acute changes -CT coronary study today revealed coronary calcium score 43, 66 percentile, mild total plaque volume, normal coronary roigin with left dominance, mild scattered calcified mid LAD 0-24%;  -Echocardiogram today revealed lvef 60-65%, no RWMA, normal RV, no significant valvular disease  -Lipid panel  revealed LDL 125, HDL 33; TSH WNL; W0J pending/may follow in next visit  - start Lipitor 20mg  daily, ASA 81mg  daily, and PRN Nitroglycerin for chest pain, ; new scripts sent to [facility] pharmacy today  -Discharge today given negative workup and ruled out ACS, chest pain is likely musculoskeletal in origin versus GI  -Follow-up arranged on 02/27/23   HLD - see lipid panel above - start lipitor 20mg  daily  - consider check lipid panel and LFTs in 6-8 weeks   Obesity - discussed weight loss and diet changes       Did the patient have an acute coronary syndrome (MI, NSTEMI, STEMI, etc) this admission?:  No                               Did the patient have a percutaneous coronary intervention (stent / angioplasty)?:  No.          _____________  Discharge Vitals Blood pressure 125/72, pulse (!) 58, temperature 98.2 F (36.8 C), temperature source Oral, resp. rate 18, height 5\' 10"  (1.778 m), weight 127.2 kg, SpO2 98%.  Filed Weights   02/06/23 1135 02/06/23 2351 02/07/23 0500  Weight: 127.9 kg 127.2 kg 127.2 kg   Vitals:  Vitals:   02/07/23 0726 02/07/23 1125  BP: 110/60 125/72  Pulse: (!) 55 (!) 58  Resp: 18 18  Temp: (!) 97.5 F (36.4 C) 98.2 F (36.8 C)  SpO2: 98% 98%   General Appearance: In no apparent distress, laying in bed, well nourished  HEENT: Normocephalic, atraumatic.  Cardiovascular: Regular rate and rhythm, normal S1-S2 Respiratory: Resting breathing unlabored, lungs sounds clear to auscultation bilaterally, no use of accessory muscles. On room air.   Gastrointestinal: Bowel sounds positive, abdomen soft, non-tender, non-distended.  Extremities: No edema of BLE  Musculoskeletal: Normal muscle bulk and tone Skin: Intact, warm, dry. No rashes or petechiae noted in exposed areas. Neurologic: Alert, oriented to person, place and time. No cognitive deficit Psychiatric: Normal affect. Mood is appropriate.    Labs & Radiologic Studies    CBC Recent Labs     02/06/23 2152 02/07/23 0201  WBC 6.0 5.7  HGB 13.6 13.9  HCT 42.5 43.1  MCV 88.4 90.9  PLT 198 PLATELET CLUMPS NOTED ON SMEAR, UNABLE TO ESTIMATE   Basic Metabolic Panel Recent Labs    81/19/14 2152 02/07/23 0201  NA 136 136  K 3.5 3.6  CL 104 105  CO2 23 22  GLUCOSE 137* 101*  BUN 10 11  CREATININE 0.79 0.83  CALCIUM 8.6* 8.4*  MG 1.9  --    Liver Function Tests Recent Labs    02/06/23 2152  AST 31  ALT 35  ALKPHOS 45  BILITOT 0.9  PROT 6.4*  ALBUMIN 3.4*   No results for input(s): "LIPASE", "AMYLASE" in the last 72 hours. High Sensitivity Troponin:   Recent Labs  Lab 02/06/23 1145 02/06/23 1325  TROPONINIHS 11 11    BNP Invalid input(s): "POCBNP" D-Dimer No results for input(s): "DDIMER" in the last 72 hours. Hemoglobin A1C No results for input(s): "HGBA1C" in the last 72 hours. Fasting Lipid Panel Recent Labs    02/07/23 0201  CHOL 174  HDL 33*  LDLCALC 125*  TRIG 78  CHOLHDL 5.3   Thyroid Function Tests Recent Labs    02/06/23 2152  TSH 2.102   _____________  ECHOCARDIOGRAM COMPLETE  Result Date: 02/07/2023    ECHOCARDIOGRAM REPORT   Patient Name:   Shane [name] Date of Exam: 02/07/2023 Medical Rec #:  [id number]         Height:       70.0 in Accession #:    [id number]        Weight:       280.4 lb Date of Birth:  1966-02-13         BSA:          2.410 m Patient Age:    57 years          BP:           110/60 mmHg Patient Gender: M                 HR:           54 bpm. Exam Location:  Inpatient Procedure: 2D Echo, Cardiac Doppler, Color Doppler and Intracardiac            Opacification Agent Indications:    Chest Pain  History:        Patient has no prior history of Echocardiogram examinations.                 Signs/Symptoms:Chest Pain.  Sonographer:    Darlys Gales Referring Phys: [id number] ANGELA NICOLE DUKE IMPRESSIONS  1. Left ventricular ejection fraction, by estimation, is 60 to 65%. The left ventricle has normal function. The left  ventricle has no regional wall motion abnormalities. Left ventricular diastolic parameters are indeterminate.  2. Right ventricular systolic function is normal. The right ventricular size is normal.  3. The mitral valve is normal in structure. No evidence of mitral valve regurgitation. No evidence of mitral stenosis.  4. The aortic valve is normal in structure. Aortic valve regurgitation is not visualized. No aortic stenosis is present.  5. The inferior vena cava is dilated in size with >50% respiratory variability, suggesting right atrial pressure of 8 mmHg. FINDINGS  Left Ventricle: Left ventricular ejection fraction, by estimation, is 60 to 65%. The left ventricle has normal function. The left ventricle has no regional wall motion abnormalities. Definity contrast agent was given IV to delineate the left ventricular  endocardial borders. The left ventricular internal cavity size was normal in size. There is no left ventricular hypertrophy. Left ventricular diastolic parameters are indeterminate. Right Ventricle: The right ventricular size is normal. No increase in right ventricular wall thickness. Right ventricular systolic function is normal. Left Atrium: Left atrial size was normal in size. Right Atrium: Right atrial size was normal in size. Pericardium: There is no evidence of pericardial effusion. Mitral Valve: The mitral valve is normal in structure. No evidence of mitral valve regurgitation. No evidence of mitral valve stenosis. Tricuspid Valve: The tricuspid valve is normal in structure. Tricuspid valve regurgitation is not demonstrated. No evidence of tricuspid stenosis. Aortic Valve: The aortic valve is normal in structure. Aortic valve regurgitation is not visualized. No aortic stenosis is present.  Aortic valve mean gradient measures 4.0 mmHg. Aortic valve peak gradient measures 7.4 mmHg. Aortic valve area, by VTI measures 2.99 cm. Pulmonic Valve: The pulmonic valve was normal in structure. Pulmonic  valve regurgitation is not visualized. No evidence of pulmonic stenosis. Aorta: The aortic root is normal in size and structure. Venous: The inferior vena cava is dilated in size with greater than 50% respiratory variability, suggesting right atrial pressure of 8 mmHg. IAS/Shunts: No atrial level shunt detected by color flow Doppler.  LEFT VENTRICLE PLAX 2D LVIDd:         5.70 cm   Diastology LVIDs:         3.40 cm   LV e' medial:    6.64 cm/s LV PW:         0.90 cm   LV E/e' medial:  15.5 LV IVS:        0.80 cm   LV e' lateral:   8.92 cm/s LVOT diam:     2.20 cm   LV E/e' lateral: 11.5 LV SV:         93 LV SV Index:   38 LVOT Area:     3.80 cm  RIGHT VENTRICLE             IVC RV S prime:     11.10 cm/s  IVC diam: 2.20 cm TAPSE (M-mode): 2.3 cm LEFT ATRIUM             Index LA Vol (A2C):   46.8 ml 19.42 ml/m LA Vol (A4C):   66.6 ml 27.64 ml/m LA Biplane Vol: 55.4 ml 22.99 ml/m  AORTIC VALVE AV Area (Vmax):    2.88 cm AV Area (Vmean):   2.90 cm AV Area (VTI):     2.99 cm AV Vmax:           136.00 cm/s AV Vmean:          99.500 cm/s AV VTI:            0.310 m AV Peak Grad:      7.4 mmHg AV Mean Grad:      4.0 mmHg LVOT Vmax:         103.00 cm/s LVOT Vmean:        75.800 cm/s LVOT VTI:          0.244 m LVOT/AV VTI ratio: 0.79  AORTA Ao Root diam: 3.10 cm Ao Asc diam:  3.70 cm MITRAL VALVE MV Area (PHT): 4.24 cm     SHUNTS MV Decel Time: 179 msec     Systemic VTI:  0.24 m MV E velocity: 103.00 cm/s  Systemic Diam: 2.20 cm MV A velocity: 52.00 cm/s MV E/A ratio:  1.98 Aditya Sabharwal Electronically signed by Dorthula Nettles Signature Date/Time: 02/07/2023/1:44:19 PM    Final    CT CORONARY MORPH W/CTA COR W/SCORE W/CA W/CM &/OR WO/CM  Result Date: 02/07/2023 CLINICAL DATA:  57 year old with chest pain. EXAM: Cardiac/Coronary  CTA TECHNIQUE: The patient was scanned on a [employer]. FINDINGS: A 120 kV prospective scan was triggered in the descending thoracic aorta at 111 HU's. Axial non-contrast 3  mm slices were carried out through the heart. The data set was analyzed on a dedicated work station and scored using the Agatson method. Gantry rotation speed was 250 msecs and collimation was .6 mm. 0.8 mg of sl NTG was given. The 3D data set was reconstructed in 5% intervals of the 67-82 % of the R-R cycle. Diastolic  phases were analyzed on a dedicated work station using MPR, MIP and VRT modes. The patient received 80 cc of contrast. Image quality: Good, mild misregistration Aorta:  Normal size.  No calcifications.  No dissection. Aortic Valve:  Trileaflet.  No calcifications. Coronary Arteries:  Normal coronary origin.  Right dominance. RCA is a small non dominant artery.  There is no plaque. Left main is a large artery that gives rise to LAD and LCX arteries. LAD is a large vessel that has scattered calcified plaque mid LAD, 0-24%. D1-3 - small caliber no stenosis LCX is a large dominant artery that gives rise to PDA and PL. There is no plaque. OM1-high OM, moderate sized no plaque PDA - small caliber Other findings: Normal pulmonary vein drainage into the left atrium. Normal left atrial appendage without a thrombus. Normal size of the pulmonary artery. Please see radiology report for non cardiac findings. IMPRESSION: 1. Coronary calcium score of 43. This was 29 percentile for age and sex matched control. 2. Total plaque volume (TPV) 150 mm3 which is 38th percentile for age-and sex matched controls (calcified plaque 12 mm3; non-calcified plaque 138 mm3). TPV is mild. 3. Normal coronary origin with LEFT dominance. 4. Mild scattered calcified mid LAD, 0-24%. Electronically Signed   By: Donato Schultz M.D.   On: 02/07/2023 13:36   DG Chest 2 View  Result Date: 02/06/2023 CLINICAL DATA:  Chest pain and shortness of breath. EXAM: CHEST - 2 VIEW COMPARISON:  Chest radiographs 12/08/2022 and chest CTA 12/15/2022 FINDINGS: The cardiomediastinal silhouette is unchanged with normal heart size. Chronic appearing  coarsening of the interstitial markings is unchanged from the prior radiographs. No overt pulmonary edema, airspace consolidation, pleural effusion, or pneumothorax is identified. No acute osseous abnormality is seen. IMPRESSION: No active cardiopulmonary disease. Electronically Signed   By: Sebastian Ache M.D.   On: 02/06/2023 12:56   Disposition   Patient is seen by Dr Eden Emms and myself today. All workup results reviewed with the patient and wife at bedside. Discharge plan, medication change, follow up plan reviewed with the patient and agreeable. Pt is being discharged home today in good condition.  Follow-up Plans & Appointments     Follow-up Information     Corrin Parker, PA-C Follow up on 02/27/2023.   Specialty: Cardiology Why: at 2:20 pm for your cardiology post hospital follow up Contact information: [address] [place] [address] [phone]                Discharge Instructions     Diet - low sodium heart healthy   Complete by: As directed    Discharge instructions   Complete by: As directed    New medication:  Aspirin 81mg  daily  Atorvastatin 20mg  daily  Nitroglycerin 0.4mg  as needed for chest pain   Please follow up with PCP in 2-3 weeks and cardiology as scheduled on 02/27/23   Increase activity slowly   Complete by: As directed         Discharge Medications   Allergies as of 02/07/2023       Reactions   Penicillins Cross Reactors Rash        Medication List     STOP taking these medications    meloxicam 7.5 MG tablet Commonly known as: MOBIC       TAKE these medications    aspirin EC 81 MG tablet Take 1 tablet (81 mg total) by mouth daily. Swallow whole. Start taking on: February 08, 2023  atorvastatin 20 MG tablet Commonly known as: Lipitor Take 1 tablet (20 mg total) by mouth daily.   cyclobenzaprine 5 MG tablet Commonly known as: FLEXERIL Take 1 tablet (5 mg total) by mouth at bedtime as needed for muscle  spasms.   nitroGLYCERIN 0.4 MG SL tablet Commonly known as: NITROSTAT Place 1 tablet (0.4 mg total) under the tongue every 5 (five) minutes x 3 doses as needed for chest pain.           Outstanding Labs/Studies    Duration of Discharge Encounter   Greater than 30 minutes including physician time.  Signed, Cyndi Bender, NP 02/07/2023, 2:47 PM

## 2023-02-07 NOTE — TOC Initial Note (Signed)
Transition of Care [facility]) - Initial/Assessment Note    Patient Details  Name: Shane [name] MRN: [id number] Date of Birth: 10/26/1965  Transition of Care [facility]) CM/SW Contact:    Leone Haven, RN Phone Number: 02/07/2023, 3:07 PM  Clinical Narrative:                 From home with wife, pta self ambulatory, he has PCP, Dr. Ardyth Harps, has insurance on file.  He has no HH services or DME.  Wife will transport him home, she is his support system. He gets his medications from [facility] in [place].    Expected Discharge Plan: Home/Self Care Barriers to Discharge: No Barriers Identified   Patient Goals and CMS Choice Patient states their goals for this hospitalization and ongoing recovery are:: return home   Choice offered to / list presented to : NA      Expected Discharge Plan and Services In-house Referral: NA Discharge Planning Services: CM Consult Post Acute Care Choice: NA Living arrangements for the past 2 months: Single Family Home Expected Discharge Date: 02/07/23                 DME Agency: NA       HH Arranged: NA          Prior Living Arrangements/Services Living arrangements for the past 2 months: Single Family Home Lives with:: Spouse Patient language and need for interpreter reviewed:: Yes Do you feel safe going back to the place where you live?: Yes      Need for Family Participation in Patient Care: Yes (Comment) Care giver support system in place?: Yes (comment)   Criminal Activity/Legal Involvement Pertinent to Current Situation/Hospitalization: No - Comment as needed  Activities of Daily Living Home Assistive Devices/Equipment: None ADL Screening (condition at time of admission) Patient's cognitive ability adequate to safely complete daily activities?: Yes Is the patient deaf or have difficulty hearing?: No Does the patient have difficulty seeing, even when wearing glasses/contacts?: No Does the patient have difficulty concentrating,  remembering, or making decisions?: No Patient able to express need for assistance with ADLs?: Yes Does the patient have difficulty dressing or bathing?: No Independently performs ADLs?: Yes (appropriate for developmental age) Does the patient have difficulty walking or climbing stairs?: No Weakness of Legs: None Weakness of Arms/Hands: None  Permission Sought/Granted                  Emotional Assessment Appearance:: Appears stated age Attitude/Demeanor/Rapport: Engaged Affect (typically observed): Appropriate Orientation: : Oriented to Self, Oriented to Place, Oriented to  Time, Oriented to Situation Alcohol / Substance Use: Not Applicable Psych Involvement: No (comment)  Admission diagnosis:  Unstable angina (HCC) [I20.0] Patient Active Problem List   Diagnosis Date Noted   Sepsis (HCC) 02/16/2020   Cellulitis of left lower extremity 02/16/2020   Vitamin D deficiency 02/04/2020   Hyperlipidemia 02/04/2020   Essential hypertension 10/07/2013   Chest pain of uncertain etiology 09/17/2013   Palpitations 09/17/2013   Acute diarrhea 01/17/2012   Acute gastroenteritis 01/17/2012   Mild dehydration 01/17/2012   Obesity 07/24/2008   GERD 07/24/2008   HEMATURIA, MICROSCOPIC, HX OF 07/24/2008   PCP:  Philip Aspen, Limmie Patricia, MD Pharmacy:   [facility] [address], [place] - [id number] [place] [address] Phone: [phone] Fax: [phone]     Social Determinants of Health (SDOH) Social History: SDOH Screenings   Food Insecurity: No Food Insecurity (02/06/2023)  Housing:  Low Risk  (02/06/2023)  Transportation Needs: No Transportation Needs (02/06/2023)  Utilities: Not At Risk (02/06/2023)  Depression (PHQ2-9): Low Risk  (01/25/2023)  Social Connections: Unknown (11/22/2021)   Received from [facility]  Tobacco Use: Low Risk  (02/06/2023)   SDOH Interventions:     Readmission Risk Interventions     No data to  display

## 2023-02-07 NOTE — Telephone Encounter (Signed)
error 

## 2023-02-07 NOTE — TOC Transition Note (Signed)
Transition of Care [facility]) - CM/SW Discharge Note   Patient Details  Name: Shane [name] MRN: [id number] Date of Birth: 1965-07-13  Transition [facility] Care [facility]) CM/SW Contact:  Leone Haven, RN Phone Number: 02/07/2023, 3:09 PM   Clinical Narrative:    For dc  wife to transport, he has no needs.   Final next level of care: Home/Self Care Barriers to Discharge: No Barriers Identified   Patient Goals and CMS Choice   Choice offered to / list presented to : NA  Discharge Placement                         Discharge Plan and Services Additional resources added to the After Visit Summary for   In-house Referral: NA Discharge Planning Services: CM Consult Post Acute Care Choice: NA            DME Agency: NA       HH Arranged: NA          Social Determinants of Health (SDOH) Interventions SDOH Screenings   Food Insecurity: No Food Insecurity (02/06/2023)  Housing: Low Risk  (02/06/2023)  Transportation Needs: No Transportation Needs (02/06/2023)  Utilities: Not At Risk (02/06/2023)  Depression (PHQ2-9): Low Risk  (01/25/2023)  Social Connections: Unknown (11/22/2021)   Received from [facility]  Tobacco Use: Low Risk  (02/06/2023)     Readmission Risk Interventions     No data to display

## 2023-02-08 LAB — HEMOGLOBIN A1C
Hgb A1c MFr Bld: 5.4 % (ref 4.8–5.6)
Mean Plasma Glucose: 108 mg/dL

## 2023-02-08 LAB — LIPOPROTEIN A (LPA): Lipoprotein (a): 48.4 nmol/L — ABNORMAL HIGH

## 2023-02-13 ENCOUNTER — Encounter: Payer: Self-pay | Admitting: Internal Medicine

## 2023-02-17 ENCOUNTER — Other Ambulatory Visit (HOSPITAL_COMMUNITY): Payer: Self-pay | Admitting: Physician Assistant

## 2023-02-17 DIAGNOSIS — E782 Mixed hyperlipidemia: Secondary | ICD-10-CM

## 2023-02-17 MED ORDER — ATORVASTATIN CALCIUM 40 MG PO TABS
40.0000 mg | ORAL_TABLET | Freq: Every day | ORAL | 3 refills | Status: DC
Start: 2023-02-17 — End: 2023-06-26

## 2023-02-21 ENCOUNTER — Ambulatory Visit: Payer: BC Managed Care – PPO | Admitting: Family Medicine

## 2023-02-21 ENCOUNTER — Encounter: Payer: Self-pay | Admitting: Family Medicine

## 2023-02-21 VITALS — BP 124/74 | HR 71 | Temp 97.1°F | Ht 70.0 in | Wt 286.1 lb

## 2023-02-21 DIAGNOSIS — L608 Other nail disorders: Secondary | ICD-10-CM | POA: Diagnosis not present

## 2023-02-21 DIAGNOSIS — S91309A Unspecified open wound, unspecified foot, initial encounter: Secondary | ICD-10-CM

## 2023-02-21 DIAGNOSIS — R234 Changes in skin texture: Secondary | ICD-10-CM

## 2023-02-21 DIAGNOSIS — M79671 Pain in right foot: Secondary | ICD-10-CM

## 2023-02-21 DIAGNOSIS — M79672 Pain in left foot: Secondary | ICD-10-CM

## 2023-02-21 MED ORDER — MUPIROCIN CALCIUM 2 % EX CREA
TOPICAL_CREAM | Freq: Two times a day (BID) | CUTANEOUS | Status: AC
Start: 2023-02-21 — End: 2023-03-07

## 2023-02-21 NOTE — Patient Instructions (Addendum)
-  Recommend to continue using epsom soaks for about 20 minutes 1-2 times a day. Pat feet dry and in between toes. -For open areas on ankle and feet, please cleanse area with Dial Antibacterial Soap, pat dry, and apply Bactroban cream. Cover area with non stick dressing. Please do this twice a day.  -Placed a referral to podiatry for further care of your feet. If you do not hear back about an appointment or receive a MyChart message in 2 weeks, please call back to the office.

## 2023-02-21 NOTE — Progress Notes (Signed)
Acute Office Visit   Subjective:  Patient ID: Shane [name], male    DOB: 19-Jan-1966, 57 y.o.   MRN: [id number]  Chief Complaint  Patient presents with   FEET ITCHING    Pt reports pain in feet, sores, itching,dry ,cracking and fever Pt reports this has been happening for few years on and off.     HPI Patient is here for an acute visit. He is experiencing pain in both feet, itching, dry, cracking skin. He reports he scratches his feet and it turns into a sore. Pain is described as burning. Intermittent. Denies any numbness or tingling.  He reports this has been going intermittently for years.  He reports he usually soaks his feet in epsom salt and uses petroleum jelly. Usually worse in the summer and spring.   ROS See HPI above      Objective:    BP 124/74   Pulse 71   Temp (!) 97.1 F (36.2 C)   Ht 5\' 10"  (1.778 m)   Wt 286 lb 2 oz (129.8 kg)   SpO2 98%   BMI 41.05 kg/m    Physical Exam Vitals reviewed.  Constitutional:      General: He is not in acute distress.    Appearance: Normal appearance. He is obese. He is not ill-appearing, toxic-appearing or diaphoretic.  Eyes:     General:        Right eye: No discharge.        Left eye: No discharge.     Conjunctiva/sclera: Conjunctivae normal.  Cardiovascular:     Rate and Rhythm: Normal rate.     Pulses:          Dorsalis pedis pulses are 3+ on the right side and 3+ on the left side.  Pulmonary:     Effort: Pulmonary effort is normal. No respiratory distress.     Breath sounds: Normal breath sounds.  Musculoskeletal:        General: Normal range of motion.     Right lower leg: Edema present.     Left lower leg: Edema present.  Feet:     Right foot:     Skin integrity: Skin breakdown and dry skin present.     Toenail Condition: Right toenails are abnormally thick and long.     Left foot:     Skin integrity: Skin breakdown and dry skin present.     Toenail Condition: Left toenails are abnormally thick  and long.  Skin:    General: Skin is warm and dry.  Neurological:     General: No focal deficit present.     Mental Status: He is alert and oriented to person, place, and time. Mental status is at baseline.  Psychiatric:        Mood and Affect: Mood normal.        Behavior: Behavior normal.        Thought Content: Thought content normal.        Judgment: Judgment normal.           Assessment & Plan:  Multiple open wounds of foot -     Mupirocin Calcium -     Ambulatory referral to Podiatry  Foot pain, bilateral -     Ambulatory referral to Podiatry  Discoloration and thickening of nails both feet -     Ambulatory referral to Podiatry  Cracked skin on feet -     Ambulatory referral to Podiatry  -Recommend to continue using epsom  soaks for about 20 minutes 1-2 times a day. Pat feet dry and in between toes. -For open areas on ankle and feet, please cleanse area with Dial Antibacterial Soap, pat dry, and apply Bactroban cream. Cover area with non stick dressing. Please do this twice a day.  -Placed a referral to podiatry for further care of your feet. If you do not hear back about an appointment or receive a MyChart message in 2 weeks, please call back to the office.  -Work excuse note provided to be out of work until Monday, 08/19 at his request.  -Recommend to rest and elevate his lower extremities.   Zandra Abts, NP

## 2023-02-23 ENCOUNTER — Encounter (INDEPENDENT_AMBULATORY_CARE_PROVIDER_SITE_OTHER): Payer: Self-pay

## 2023-02-24 ENCOUNTER — Telehealth: Payer: Self-pay | Admitting: Internal Medicine

## 2023-02-24 NOTE — Telephone Encounter (Signed)
Letter completed and placed in front pick up file per pt request

## 2023-02-24 NOTE — Telephone Encounter (Signed)
Caller name: VITOR SONDGEROTH  On DPR?: Yes  Call back number: [phone] (mobile)  Provider they see: Philip Aspen, Limmie Patricia, MD  Reason for call:  Pt states employer wants 'back to work letter' to say return to work with no restrictions

## 2023-02-27 ENCOUNTER — Ambulatory Visit: Payer: BC Managed Care – PPO | Admitting: Student

## 2023-03-03 ENCOUNTER — Encounter: Payer: Self-pay | Admitting: Podiatry

## 2023-03-03 ENCOUNTER — Ambulatory Visit: Payer: BC Managed Care – PPO | Admitting: Podiatry

## 2023-03-03 DIAGNOSIS — B353 Tinea pedis: Secondary | ICD-10-CM | POA: Diagnosis not present

## 2023-03-03 DIAGNOSIS — M79675 Pain in left toe(s): Secondary | ICD-10-CM | POA: Diagnosis not present

## 2023-03-03 DIAGNOSIS — L84 Corns and callosities: Secondary | ICD-10-CM | POA: Diagnosis not present

## 2023-03-03 DIAGNOSIS — B351 Tinea unguium: Secondary | ICD-10-CM

## 2023-03-03 DIAGNOSIS — M79674 Pain in right toe(s): Secondary | ICD-10-CM

## 2023-03-03 MED ORDER — KETOCONAZOLE 2 % EX CREA: 1.0000 | TOPICAL_CREAM | Freq: Every day | CUTANEOUS | 1 refills | Status: AC

## 2023-03-03 MED ORDER — CICLOPIROX 8 % EX SOLN: Freq: Every day | CUTANEOUS | 0 refills | Status: AC

## 2023-03-03 NOTE — Progress Notes (Signed)
  Subjective:  Patient ID: Shane [name], male    DOB: 1965/10/12,  MRN: [id number]  Chief Complaint  Patient presents with   Wound Check    RM1: new pt-multiple open wounds of foot; bilateral toe nail pain; discoloration & thickening of nails of both feet; cracked skin on feet,odor possible fungus    57 y.o. male presents with the above complaint. History confirmed with patient. Patient presenting with pain related to dystrophic thickened elongated nails. Patient is unable to trim own nails related to nail dystrophy and/or mobility issues.  Patient also has significant peeling skin dry flaking skin and callus present on the bottom of both feet.  Does notice itching on both feet as well.  Objective:  Physical Exam: warm, good capillary refill nail exam onychomycosis of the toenails, onycholysis, and dystrophic nails DP pulses palpable, PT pulses palpable, and protective sensation intact Left Foot:  Pain with palpation of nails due to elongation and dystrophic growth.  Right Foot: Pain with palpation of nails due to elongation and dystrophic growth.   Assessment:   1. Tinea pedis of both feet   2. Pain due to onychomycosis of toenails of both feet   3. Callus of foot      Plan:  Patient was evaluated and treated and all questions answered.  # Tinea pedis bilateral foot Discussed the etiology and treatment options for tinea pedis.  Discussed topical and oral treatment.  Recommended topical treatment with 2% ketoconazole cream.  This was sent to the patient's pharmacy.  Also discussed appropriate foot hygiene, use of antifungal spray such as Tinactin in shoes, as well as cleaning her foot surfaces such as showers and bathroom floors with bleach.  #  callus of foot Debrided the callus to both feet as a courtesy at this visit  #Onychomycosis with pain  -Nails palliatively debrided as below. -Educated on self-care -E Rx for Penlac topical 8% solution apply once daily to all  nails  Procedure: Nail Debridement Rationale: Pain Type of Debridement: manual, sharp debridement. Instrumentation: Nail nipper, rotary burr. Number of Nails: 10  Return in about 3 months (around 06/03/2023) for f/u tinea pedis/ onychomcosis.         Corinna Gab, DPM [facility] / [facility]

## 2023-03-10 ENCOUNTER — Ambulatory Visit: Payer: BC Managed Care – PPO | Admitting: Student

## 2023-03-15 ENCOUNTER — Ambulatory Visit (INDEPENDENT_AMBULATORY_CARE_PROVIDER_SITE_OTHER): Payer: BC Managed Care – PPO | Admitting: Internal Medicine

## 2023-03-15 ENCOUNTER — Other Ambulatory Visit: Payer: BC Managed Care – PPO

## 2023-03-15 VITALS — BP 130/70 | HR 80 | Temp 98.4°F | Ht 69.0 in | Wt 280.9 lb

## 2023-03-15 DIAGNOSIS — Z6836 Body mass index (BMI) 36.0-36.9, adult: Secondary | ICD-10-CM | POA: Diagnosis not present

## 2023-03-15 DIAGNOSIS — E559 Vitamin D deficiency, unspecified: Secondary | ICD-10-CM | POA: Diagnosis not present

## 2023-03-15 DIAGNOSIS — Z0000 Encounter for general adult medical examination without abnormal findings: Secondary | ICD-10-CM

## 2023-03-15 DIAGNOSIS — I10 Essential (primary) hypertension: Secondary | ICD-10-CM | POA: Diagnosis not present

## 2023-03-15 DIAGNOSIS — E785 Hyperlipidemia, unspecified: Secondary | ICD-10-CM

## 2023-03-15 LAB — COMPREHENSIVE METABOLIC PANEL
ALT: 31 U/L (ref 0–53)
AST: 27 U/L (ref 0–37)
Albumin: 4 g/dL (ref 3.5–5.2)
Alkaline Phosphatase: 58 U/L (ref 39–117)
BUN: 10 mg/dL (ref 6–23)
CO2: 30 meq/L (ref 19–32)
Calcium: 9.2 mg/dL (ref 8.4–10.5)
Chloride: 104 meq/L (ref 96–112)
Creatinine, Ser: 0.79 mg/dL (ref 0.40–1.50)
GFR: 99.29 mL/min (ref >60.00)
Glucose, Bld: 80 mg/dL (ref 70–99)
Potassium: 4.3 meq/L (ref 3.5–5.1)
Sodium: 141 meq/L (ref 135–145)
Total Bilirubin: 1.2 mg/dL (ref 0.2–1.2)
Total Protein: 7 g/dL (ref 6.0–8.3)

## 2023-03-15 LAB — LIPID PANEL
Cholesterol: 176 mg/dL (ref 0–200)
HDL: 35.3 mg/dL — ABNORMAL LOW (ref >39.00)
LDL Cholesterol: 122 mg/dL — ABNORMAL HIGH (ref 0–99)
NonHDL: 140.28
Total CHOL/HDL Ratio: 5
Triglycerides: 90 mg/dL (ref 0.0–149.0)
VLDL: 18 mg/dL (ref 0.0–40.0)

## 2023-03-15 LAB — CBC WITH DIFFERENTIAL/PLATELET
Basophils Absolute: 0 10*3/uL (ref 0.0–0.1)
Basophils Relative: 0.5 % (ref 0.0–3.0)
Eosinophils Absolute: 0.1 10*3/uL (ref 0.0–0.7)
Eosinophils Relative: 1.1 % (ref 0.0–5.0)
HCT: 44.8 % (ref 39.0–52.0)
Hemoglobin: 14.8 g/dL (ref 13.0–17.0)
Lymphocytes Relative: 26.4 % (ref 12.0–46.0)
Lymphs Abs: 1.8 10*3/uL (ref 0.7–4.0)
MCHC: 33 g/dL (ref 30.0–36.0)
MCV: 86.6 fl (ref 78.0–100.0)
Monocytes Absolute: 0.5 10*3/uL (ref 0.1–1.0)
Monocytes Relative: 8 % (ref 3.0–12.0)
Neutro Abs: 4.3 10*3/uL (ref 1.4–7.7)
Neutrophils Relative %: 64 % (ref 43.0–77.0)
Platelets: 175 10*3/uL (ref 150.0–400.0)
RBC: 5.18 Mil/uL (ref 4.22–5.81)
RDW: 14.7 % (ref 11.5–15.5)
WBC: 6.7 10*3/uL (ref 4.0–10.5)

## 2023-03-15 LAB — TSH: TSH: 1.28 u[IU]/mL (ref 0.35–5.50)

## 2023-03-15 LAB — VITAMIN B12: Vitamin B-12: 318 pg/mL (ref 211–911)

## 2023-03-15 LAB — VITAMIN D 25 HYDROXY (VIT D DEFICIENCY, FRACTURES): VITD: 34.81 ng/mL (ref 30.00–100.00)

## 2023-03-15 LAB — PSA: PSA: 0.5 ng/mL (ref 0.10–4.00)

## 2023-03-15 NOTE — Progress Notes (Signed)
Established Patient Office Visit     CC/Reason for Visit: Annual preventive exam  HPI: Shane DERUYTER is a 57 y.o. male who is coming in today for the above mentioned reasons. Past Medical History is significant for: History of elevated blood pressure not yet diagnosed as hypertension, morbid obesity and hyperlipidemia.  Has been feeling well.  He was hospitalized overnight in August for cardiac rule out.  All cardiac workup was negative.  He is scheduled to see cardiology for his hospital follow-up at the end of the month.   Past Medical/Surgical History: Past Medical History:  Diagnosis Date   Chronic knee pain    GERD (gastroesophageal reflux disease)    Left shoulder pain    Obesity     Past Surgical History:  Procedure Laterality Date   carpal pummel repair Bilateral    CERVICAL DISC SURGERY  2003   per Dr. Newell Coral    COLONOSCOPY  05/10/2018   per Dr. Lavon Paganini, internal hemorrhoids and sessile serrated polyps, repeat in 3 yrs    KNEE SURGERY Right     Social History:  reports that he has never smoked. He has never used smokeless tobacco. He reports that he does not drink alcohol and does not use drugs.  Allergies: Allergies  Allergen Reactions   Penicillins Cross Reactors Rash    Family History:  Family History  Problem Relation Age of Onset   Heart disease Mother        mitrial valve repair, CABG    Prostate cancer Father    Hypertension Father    Colon cancer Father        in his early 67's   Esophageal cancer Neg Hx    Rectal cancer Neg Hx    Stomach cancer Neg Hx      Current Outpatient Medications:    aspirin EC 81 MG tablet, Take 1 tablet (81 mg total) by mouth daily. Swallow whole., Disp: 30 tablet, Rfl: 3   atorvastatin (LIPITOR) 40 MG tablet, Take 1 tablet (40 mg total) by mouth daily., Disp: 90 tablet, Rfl: 3   ciclopirox (PENLAC) 8 % solution, Apply topically at bedtime. Apply over nail and surrounding skin. Apply daily over previous  coat. After seven (7) days, may remove with alcohol and continue cycle., Disp: 6.6 mL, Rfl: 0   cyclobenzaprine (FLEXERIL) 5 MG tablet, Take 1 tablet (5 mg total) by mouth at bedtime as needed for muscle spasms., Disp: 30 tablet, Rfl: 0   ketoconazole (NIZORAL) 2 % cream, Apply 1 Application topically daily., Disp: 60 g, Rfl: 1   nitroGLYCERIN (NITROSTAT) 0.4 MG SL tablet, Place 1 tablet (0.4 mg total) under the tongue every 5 (five) minutes x 3 doses as needed for chest pain., Disp: 20 tablet, Rfl: 1  Review of Systems:  Negative unless indicated in HPI.   Physical Exam: Vitals:   03/15/23 1450 03/15/23 1530  BP: (!) 142/72 130/70  Pulse: 80   Temp: 98.4 F (36.9 C)   TempSrc: Oral   SpO2: 97%   Weight: 280 lb 14.4 oz (127.4 kg)   Height: 5\' 9"  (1.753 m)     Body mass index is 41.48 kg/m.   Physical Exam Vitals reviewed.  Constitutional:      General: He is not in acute distress.    Appearance: Normal appearance. He is obese. He is not ill-appearing, toxic-appearing or diaphoretic.  HENT:     Head: Normocephalic.     Right Ear: Tympanic membrane,  ear canal and external ear normal. There is no impacted cerumen.     Left Ear: Tympanic membrane, ear canal and external ear normal. There is no impacted cerumen.     Nose: Nose normal.     Mouth/Throat:     Mouth: Mucous membranes are moist.     Pharynx: Oropharynx is clear. No oropharyngeal exudate or posterior oropharyngeal erythema.  Eyes:     General: No scleral icterus.       Right eye: No discharge.        Left eye: No discharge.     Conjunctiva/sclera: Conjunctivae normal.     Pupils: Pupils are equal, round, and reactive to light.  Neck:     Vascular: No carotid bruit.  Cardiovascular:     Rate and Rhythm: Normal rate and regular rhythm.     Pulses: Normal pulses.     Heart sounds: Normal heart sounds.  Pulmonary:     Effort: Pulmonary effort is normal. No respiratory distress.     Breath sounds: Normal breath  sounds.  Abdominal:     General: Abdomen is flat. Bowel sounds are normal.     Palpations: Abdomen is soft.  Musculoskeletal:        General: Normal range of motion.     Cervical back: Normal range of motion.  Skin:    General: Skin is warm and dry.  Neurological:     General: No focal deficit present.     Mental Status: He is alert and oriented to person, place, and time. Mental status is at baseline.  Psychiatric:        Mood and Affect: Mood normal.        Behavior: Behavior normal.        Thought Content: Thought content normal.        Judgment: Judgment normal.     Flowsheet Row Office Visit from 03/15/2023 in [facility] [place]  PHQ-9 Total Score 0        Impression and Plan:  Encounter for preventive health examination -     PSA; Future  Vitamin D deficiency -     VITAMIN D 25 Hydroxy (Vit-D Deficiency, Fractures); Future  Essential hypertension -     CBC with Differential/Platelet; Future -     Comprehensive metabolic panel; Future  Hyperlipidemia, unspecified hyperlipidemia type -     Lipid panel; Future  Class 2 severe obesity due to excess calories with serious comorbidity and body mass index (BMI) of 36.0 to 36.9 in adult (HCC) -     TSH; Future -     Vitamin B12; Future   -Recommend routine eye and dental care. -Healthy lifestyle discussed in detail. -Labs to be updated today. -Prostate cancer screening: PSA today Health Maintenance  Topic Date Due   Hepatitis C Screening  Never done   Colon Cancer Screening  05/10/2021   Flu Shot  02/09/2023   COVID-19 Vaccine (1 - 2023-24 season) Never done   DTaP/Tdap/Td vaccine (2 - Td or Tdap) 05/27/2024   HIV Screening  Completed   Zoster (Shingles) Vaccine  Completed   HPV Vaccine  Aged Out    -Denies flu vaccine today despite counseling.     Chaya Jan, MD [facility]

## 2023-03-17 ENCOUNTER — Encounter: Payer: Self-pay | Admitting: Internal Medicine

## 2023-03-20 ENCOUNTER — Other Ambulatory Visit: Payer: Self-pay | Admitting: *Deleted

## 2023-03-20 DIAGNOSIS — E785 Hyperlipidemia, unspecified: Secondary | ICD-10-CM

## 2023-04-10 ENCOUNTER — Ambulatory Visit: Payer: BC Managed Care – PPO | Attending: Student

## 2023-04-10 ENCOUNTER — Ambulatory Visit: Payer: BC Managed Care – PPO | Attending: Student | Admitting: Student

## 2023-04-10 ENCOUNTER — Encounter: Payer: Self-pay | Admitting: Student

## 2023-04-10 VITALS — BP 134/82 | HR 64 | Ht 69.0 in | Wt 277.0 lb

## 2023-04-10 DIAGNOSIS — R0789 Other chest pain: Secondary | ICD-10-CM | POA: Diagnosis not present

## 2023-04-10 DIAGNOSIS — R600 Localized edema: Secondary | ICD-10-CM | POA: Diagnosis not present

## 2023-04-10 DIAGNOSIS — R002 Palpitations: Secondary | ICD-10-CM | POA: Diagnosis not present

## 2023-04-10 DIAGNOSIS — E782 Mixed hyperlipidemia: Secondary | ICD-10-CM | POA: Diagnosis not present

## 2023-04-10 MED ORDER — AMLODIPINE BESYLATE 2.5 MG PO TABS: 2.5000 mg | ORAL_TABLET | Freq: Every day | ORAL | 3 refills | Status: DC

## 2023-04-10 NOTE — Progress Notes (Unsigned)
Enrolled for [employer] to mail a ZIO XT long term holter monitor to the patients address on file.   Dr. Nishan to read. 

## 2023-04-10 NOTE — Patient Instructions (Signed)
Medication Instructions:   Start AMLODIPINE 2.5MG ,once daily.    *If you need a refill on your cardiac medications before your next appointment, please call your pharmacy*     Testing/Procedures: ZIO XT- Long Term Monitor Instructions  Your physician has requested you wear a ZIO patch monitor for 14 days.  This is a single patch monitor. [employer] supplies one patch monitor per enrollment. Additional stickers are not available. Please do not apply patch if you will be having a Nuclear Stress Test,  Echocardiogram, Cardiac CT, MRI, or Chest Xray during the period you would be wearing the  monitor. The patch cannot be worn during these tests. You cannot remove and re-apply the  ZIO XT patch monitor.  Your ZIO patch monitor will be mailed 3 day USPS to your address on file. It may take 3-5 days  to receive your monitor after you have been enrolled.  Once you have received your monitor, please review the enclosed instructions. Your monitor  has already been registered assigning a specific monitor serial # to you.  Billing and Patient Assistance Program Information  We have supplied [employer] with any of your insurance information on file for billing purposes. [employer] offers a sliding scale Patient Assistance Program for patients that do not have  insurance, or whose insurance does not completely cover the cost of the ZIO monitor.  You must apply for the Patient Assistance Program to qualify for this discounted rate.  To apply, please call [employer] at [phone], select option 4, select option 2, ask to apply for  Patient Assistance Program. Meredeth Ide will ask your household income, and how many people  are in your household. They will quote your out-of-pocket cost based on that information.  [employer] will also be able to set up a 90-month, interest-free payment plan if needed.  Applying the monitor   Shave hair from upper left chest.  Hold abrader disc by orange tab. Rub abrader in 40  strokes over the upper left chest as  indicated in your monitor instructions.  Clean area with 4 enclosed alcohol pads. Let dry.  Apply patch as indicated in monitor instructions. Patch will be placed under collarbone on left  side of chest with arrow pointing upward.  Rub patch adhesive wings for 2 minutes. Remove white label marked "1". Remove the white  label marked "2". Rub patch adhesive wings for 2 additional minutes.  While looking in a mirror, press and release button in center of patch. A small green light will  flash 3-4 times. This will be your only indicator that the monitor has been turned on.  Do not shower for the first 24 hours. You may shower after the first 24 hours.  Press the button if you feel a symptom. You will hear a small click. Record Date, Time and  Symptom in the Patient Logbook.  When you are ready to remove the patch, follow instructions on the last 2 pages of Patient  Logbook. Stick patch monitor onto the last page of Patient Logbook.  Place Patient Logbook in the blue and white box. Use locking tab on box and tape box closed  securely. The blue and white box has prepaid postage on it. Please place it in the mailbox as  soon as possible. Your physician should have your test results approximately 7 days after the  monitor has been mailed back to [facility].  Call [facility] Customer Care at [phone] if you have questions regarding  your ZIO XT patch monitor. Call  them immediately if you see an orange light blinking on your  monitor.  If your monitor falls off in less than 4 days, contact our Monitor department at [phone].  If your monitor becomes loose or falls off after 4 days call [employer] at [phone] for  suggestions on securing your monitor    Follow-Up: At [facility], you and your health needs are our priority.  As part of our continuing mission to provide you with exceptional heart care, we have created designated Provider Care  Teams.  These Care Teams include your primary Cardiologist (physician) and Advanced Practice Providers (APPs -  Physician Assistants and Nurse Practitioners) who all work together to provide you with the care you need, when you need it.  We recommend signing up for the patient portal called "MyChart".  Sign up information is provided on this After Visit Summary.  MyChart is used to connect with patients for Virtual Visits (Telemedicine).  Patients are able to view lab/test results, encounter notes, upcoming appointments, etc.  Non-urgent messages can be sent to your provider as well.   To learn more about what you can do with MyChart, go to [email or web address].    Your next appointment:   3 month(s)  The format for your next appointment:   In Person  Provider:   Charlton Haws, MD    Other Instructions Shane [name] recommends HIGH COMPRESSION STOCKINGS  -- [facility]  -- [address] [place]  -- [phone]  -- [facility]   -- [address] [place]   -- [phone] -- [facility]  -- [address] [place]  -- [phone]   How to Use Compression Stockings Compression stockings are elastic socks that squeeze the legs. They help to increase blood flow to the legs, decrease swelling in the legs, and reduce the chance of developing blood clots in the lower legs. Compression stockings are often used by people who: Are recovering from surgery. Have poor circulation in their legs. Are prone to getting blood clots in their legs. Have varicose veins. Sit or stay in bed for long periods of time. How to use compression stockings Before you put on your compression stockings: Make sure that they are the correct size. If you do not know your size, ask your health care provider. Make sure that they are clean, dry, and in good condition. Check them for rips and tears. Do not put them on if they are ripped or torn. Put  your stockings on first thing in the morning, before you get out of bed. Keep them on for as long as your health care provider advises. When you are wearing your stockings: Keep them as smooth as possible. Do not allow them to bunch up. It is especially important to prevent the stockings from bunching up around your toes or behind your knees. Do not roll the stockings downward and leave them rolled down. This can decrease blood flow to your leg. Change them right away if they become wet or dirty. When you take off your stockings, inspect your legs and feet. Anything that does not seem normal may require medical attention. Look for: Open sores. Red spots. Swelling. Information and tips Do not stop wearing your compression stockings without talking to your health care provider first. Wash your stockings every day with mild detergent in cold or warm water. Do not use bleach. Air-dry your stockings or dry them in a clothes dryer on low heat. Replace your stockings every 3-6  months. If skin moisturizing is part of your treatment plan, apply lotion or cream at night so that your skin will be dry when you put on the stockings in the morning. It is harder to put the stockings on when you have lotion on your legs or feet. Contact a health care provider if: Remove your stockings and seek medical care if: You have a feeling of pins and needles in your feet or legs. You have any new changes in your skin. You have skin lesions that are getting worse. You have swelling or pain that is getting worse. Get help right away if: You have numbness or tingling in your lower legs that does not get better right after you take the stockings off. Your toes or feet become cold and blue. You develop open sores or red spots on your legs that do not go away. You see or feel a warm spot on your leg. You have new swelling or soreness in your leg. You are short of breath or you have chest pain for no reason. You have a  rapid or irregular heartbeat. You feel light-headed or dizzy. This information is not intended to replace advice given to you by your health care provider. Make sure you discuss any questions you have with your health care provider.

## 2023-04-22 DIAGNOSIS — R002 Palpitations: Secondary | ICD-10-CM | POA: Diagnosis not present

## 2023-04-25 ENCOUNTER — Telehealth (INDEPENDENT_AMBULATORY_CARE_PROVIDER_SITE_OTHER): Payer: BC Managed Care – PPO | Admitting: Adult Health

## 2023-04-25 ENCOUNTER — Encounter: Payer: Self-pay | Admitting: Adult Health

## 2023-04-25 VITALS — Ht 69.0 in | Wt 277.0 lb

## 2023-04-25 DIAGNOSIS — M545 Low back pain, unspecified: Secondary | ICD-10-CM | POA: Diagnosis not present

## 2023-04-25 MED ORDER — METHYLPREDNISOLONE 4 MG PO TBPK
ORAL_TABLET | ORAL | 0 refills | Status: DC
Start: 2023-04-25 — End: 2023-05-01

## 2023-04-25 NOTE — Progress Notes (Signed)
Virtual Visit via Video Note  I connected with Shane [name] on 04/25/23 at  1:45 PM EDT by a video enabled telemedicine application and verified that I am speaking with the correct person using two identifiers.  Location patient: home Location provider:work or home office Persons participating in the virtual visit: patient, provider  I discussed the limitations of evaluation and management by telemedicine and the availability of in person appointments. The patient expressed understanding and agreed to proceed.   HPI:  57 year old male, patient of Dr. Ardyth Harps who I am seeing today for an acute issue.  He reports that 2 days ago he noticed that his lower back was starting to hurt.  Yesterday he was walking down a slope and his foot got caught which caused worsening low back pain he has pain with walking, sitting, and bending over.  Denies any radiculopathy.  No issues with bowel or bladder.  He did have Flexeril leftover from his PCP and took 1 of these yesterday but it did not help.  ROS: See pertinent positives and negatives per HPI.  Past Medical History:  Diagnosis Date   Chronic knee pain    GERD (gastroesophageal reflux disease)    Left shoulder pain    Obesity     Past Surgical History:  Procedure Laterality Date   carpal pummel repair Bilateral    CERVICAL DISC SURGERY  2003   per Dr. Newell Coral    COLONOSCOPY  05/10/2018   per Dr. Lavon Paganini, internal hemorrhoids and sessile serrated polyps, repeat in 3 yrs    KNEE SURGERY Right     Family History  Problem Relation Age of Onset   Heart disease Mother        mitrial valve repair, CABG    Prostate cancer Father    Hypertension Father    Colon cancer Father        in his early 33's   Esophageal cancer Neg Hx    Rectal cancer Neg Hx    Stomach cancer Neg Hx        Current Outpatient Medications:    amLODipine (NORVASC) 2.5 MG tablet, Take 1 tablet (2.5 mg total) by mouth daily., Disp: 90 tablet, Rfl: 3    aspirin EC 81 MG tablet, Take 1 tablet (81 mg total) by mouth daily. Swallow whole., Disp: 30 tablet, Rfl: 3   atorvastatin (LIPITOR) 40 MG tablet, Take 1 tablet (40 mg total) by mouth daily., Disp: 90 tablet, Rfl: 3   ciclopirox (PENLAC) 8 % solution, Apply topically at bedtime. Apply over nail and surrounding skin. Apply daily over previous coat. After seven (7) days, may remove with alcohol and continue cycle., Disp: 6.6 mL, Rfl: 0   cyclobenzaprine (FLEXERIL) 5 MG tablet, Take 1 tablet (5 mg total) by mouth at bedtime as needed for muscle spasms., Disp: 30 tablet, Rfl: 0   ketoconazole (NIZORAL) 2 % cream, Apply 1 Application topically daily., Disp: 60 g, Rfl: 1   nitroGLYCERIN (NITROSTAT) 0.4 MG SL tablet, Place 1 tablet (0.4 mg total) under the tongue every 5 (five) minutes x 3 doses as needed for chest pain. (Patient not taking: Reported on 04/25/2023), Disp: 20 tablet, Rfl: 1  EXAM:  VITALS per patient if applicable:  GENERAL: alert, oriented, appears well and in no acute distress  HEENT: atraumatic, conjunttiva clear, no obvious abnormalities on inspection of external nose and ears  NECK: normal movements of the head and neck  LUNGS: on inspection no signs of respiratory distress, breathing rate appears  normal, no obvious gross SOB, gasping or wheezing  CV: no obvious cyanosis  MS: moves all visible extremities without noticeable abnormality  PSYCH/NEURO: pleasant and cooperative, no obvious depression or anxiety, speech and thought processing grossly intact  ASSESSMENT AND PLAN:  Discussed the following assessment and plan:  1. Acute bilateral low back pain without sciatica -Will send in Medrol Dosepak.  Can continue to use Flexeril nightly.  Advised heating pad and stretching exercises.  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 - methylPREDNISolone (MEDROL DOSEPAK) 4 MG TBPK tablet; Take as directed  Dispense: 21 tablet;  Refill: 0     I discussed the assessment and treatment plan with the patient. The patient was provided an opportunity to ask questions and all were answered. The patient agreed with the plan and demonstrated an understanding of the instructions.   The patient was advised to call back or seek an in-person evaluation if the symptoms worsen or if the condition fails to improve as anticipated.   Shirline Frees, NP

## 2023-04-30 ENCOUNTER — Encounter: Payer: Self-pay | Admitting: Adult Health

## 2023-05-01 ENCOUNTER — Ambulatory Visit: Payer: BC Managed Care – PPO | Admitting: Internal Medicine

## 2023-05-01 ENCOUNTER — Encounter: Payer: Self-pay | Admitting: Internal Medicine

## 2023-05-01 VITALS — BP 130/84 | HR 76 | Temp 98.3°F | Wt 276.4 lb

## 2023-05-01 DIAGNOSIS — M545 Low back pain, unspecified: Secondary | ICD-10-CM

## 2023-05-01 MED ORDER — PREDNISONE 10 MG (21) PO TBPK
ORAL_TABLET | ORAL | 0 refills | Status: AC
Start: 2023-05-01 — End: ?

## 2023-05-01 MED ORDER — MELOXICAM 7.5 MG PO TABS
7.5000 mg | ORAL_TABLET | Freq: Every day | ORAL | 0 refills | Status: DC
Start: 2023-05-01 — End: 2023-10-18

## 2023-05-01 NOTE — Telephone Encounter (Signed)
Please advise 

## 2023-05-01 NOTE — Progress Notes (Signed)
Established Patient Office Visit     CC/Reason for Visit: Continued low back pain  HPI: Shane [name] is a 57 y.o. male who is coming in today for the above mentioned reasons.  Problems started last week with some right lower back stiffness.  This got worse when he caught his foot and almost fell to the ground.  This caused him to overstretch his back.  He was seen virtually last week and given a prednisone taper this initially improved his symptoms but they promptly returned upon finishing the taper.  Past Medical/Surgical History: Past Medical History:  Diagnosis Date   Chronic knee pain    GERD (gastroesophageal reflux disease)    Left shoulder pain    Obesity     Past Surgical History:  Procedure Laterality Date   carpal pummel repair Bilateral    CERVICAL DISC SURGERY  2003   per Dr. Newell Coral    COLONOSCOPY  05/10/2018   per Dr. Lavon Paganini, internal hemorrhoids and sessile serrated polyps, repeat in 3 yrs    KNEE SURGERY Right     Social History:  reports that he has never smoked. He has never used smokeless tobacco. He reports that he does not drink alcohol and does not use drugs.  Allergies: Allergies  Allergen Reactions   Penicillins Cross Reactors Rash    Family History:  Family History  Problem Relation Age of Onset   Heart disease Mother        mitrial valve repair, CABG    Prostate cancer Father    Hypertension Father    Colon cancer Father        in his early 28's   Esophageal cancer Neg Hx    Rectal cancer Neg Hx    Stomach cancer Neg Hx      Current Outpatient Medications:    amLODipine (NORVASC) 2.5 MG tablet, Take 1 tablet (2.5 mg total) by mouth daily., Disp: 90 tablet, Rfl: 3   aspirin EC 81 MG tablet, Take 1 tablet (81 mg total) by mouth daily. Swallow whole., Disp: 30 tablet, Rfl: 3   atorvastatin (LIPITOR) 40 MG tablet, Take 1 tablet (40 mg total) by mouth daily., Disp: 90 tablet, Rfl: 3   ciclopirox (PENLAC) 8 % solution, Apply  topically at bedtime. Apply over nail and surrounding skin. Apply daily over previous coat. After seven (7) days, may remove with alcohol and continue cycle., Disp: 6.6 mL, Rfl: 0   cyclobenzaprine (FLEXERIL) 5 MG tablet, Take 1 tablet (5 mg total) by mouth at bedtime as needed for muscle spasms., Disp: 30 tablet, Rfl: 0   ketoconazole (NIZORAL) 2 % cream, Apply 1 Application topically daily., Disp: 60 g, Rfl: 1   meloxicam (MOBIC) 7.5 MG tablet, Take 1 tablet (7.5 mg total) by mouth daily., Disp: 30 tablet, Rfl: 0   nitroGLYCERIN (NITROSTAT) 0.4 MG SL tablet, Place 1 tablet (0.4 mg total) under the tongue every 5 (five) minutes x 3 doses as needed for chest pain., Disp: 20 tablet, Rfl: 1   predniSONE (STERAPRED UNI-PAK 21 TAB) 10 MG (21) TBPK tablet, Take as directed, Disp: 21 tablet, Rfl: 0  Review of Systems:  Negative unless indicated in HPI.   Physical Exam: Vitals:   05/01/23 1602  BP: 130/84  Pulse: 76  Temp: 98.3 F (36.8 C)  TempSrc: Oral  SpO2: 98%  Weight: 276 lb 6.4 oz (125.4 kg)    Body mass index is 40.82 kg/m.   Physical Exam Neurological:  General: No focal deficit present.     Mental Status: He is oriented to person, place, and time.      Impression and Plan:  Acute right-sided low back pain without sciatica -     Ambulatory referral to Physical Therapy -     Meloxicam; Take 1 tablet (7.5 mg total) by mouth daily.  Dispense: 30 tablet; Refill: 0 -     predniSONE; Take as directed  Dispense: 21 tablet; Refill: 0  -Back pain as per history despite home stretches and 1 round of steroids. -Refer to physical therapy, placed on a 10-day course of meloxicam and another round of prednisone. -Work note provided to stay out this week.   Time spent:30 minutes reviewing chart, interviewing and examining patient and formulating plan of care.     Chaya Jan, MD [facility]

## 2023-05-18 DIAGNOSIS — R002 Palpitations: Secondary | ICD-10-CM | POA: Diagnosis not present

## 2023-05-19 ENCOUNTER — Telehealth: Payer: Self-pay | Admitting: *Deleted

## 2023-05-19 NOTE — Telephone Encounter (Signed)
-----   Message from Carlos Levering sent at 05/18/2023 12:45 PM EST ----- Please let patient know heart monitor showed brief episodes of fast heart rate called supraventricular tachycardia with the longest episode lasting about 17 seconds. Please ask patient to start Toprol 12.5 mg daily, f willing. Keep follow up in January.   Thank you!  DW

## 2023-05-19 NOTE — Telephone Encounter (Signed)
Left message for pt to call.

## 2023-05-26 ENCOUNTER — Other Ambulatory Visit: Payer: Self-pay

## 2023-05-26 ENCOUNTER — Ambulatory Visit: Payer: BC Managed Care – PPO | Attending: Internal Medicine

## 2023-05-26 DIAGNOSIS — M5459 Other low back pain: Secondary | ICD-10-CM | POA: Insufficient documentation

## 2023-05-26 DIAGNOSIS — R293 Abnormal posture: Secondary | ICD-10-CM | POA: Insufficient documentation

## 2023-05-26 DIAGNOSIS — M6281 Muscle weakness (generalized): Secondary | ICD-10-CM | POA: Diagnosis not present

## 2023-05-26 DIAGNOSIS — R262 Difficulty in walking, not elsewhere classified: Secondary | ICD-10-CM | POA: Diagnosis not present

## 2023-05-26 DIAGNOSIS — R252 Cramp and spasm: Secondary | ICD-10-CM

## 2023-05-26 NOTE — Therapy (Signed)
OUTPATIENT PHYSICAL THERAPY THORACOLUMBAR EVALUATION   Patient Name: Shane [name] MRN: [id number] DOB:1965/12/30, 57 y.o., male Today's Date: 05/26/2023  END OF SESSION:  PT End of Session - 05/26/23 1104     Visit Number 1    Date for PT Re-Evaluation 07/21/23    Authorization Type [employer] PPO    Progress Note Due on Visit 10    PT Start Time 1004    PT Stop Time 1100    PT Time Calculation (min) 56 min    Activity Tolerance Patient tolerated treatment well    Behavior During Therapy WFL for tasks assessed/performed             Past Medical History:  Diagnosis Date   Chronic knee pain    GERD (gastroesophageal reflux disease)    Left shoulder pain    Obesity    Past Surgical History:  Procedure Laterality Date   carpal pummel repair Bilateral    CERVICAL DISC SURGERY  2003   per Dr. Newell Coral    COLONOSCOPY  05/10/2018   per Dr. Lavon Paganini, internal hemorrhoids and sessile serrated polyps, repeat in 3 yrs    KNEE SURGERY Right    Patient Active Problem List   Diagnosis Date Noted   Sepsis (HCC) 02/16/2020   Cellulitis of left lower extremity 02/16/2020   Vitamin D deficiency 02/04/2020   Hyperlipidemia 02/04/2020   Essential hypertension 10/07/2013   Chest pain of uncertain etiology 09/17/2013   Palpitations 09/17/2013   Acute diarrhea 01/17/2012   Acute gastroenteritis 01/17/2012   Mild dehydration 01/17/2012   Obesity 07/24/2008   GERD 07/24/2008   HEMATURIA, MICROSCOPIC, HX OF 07/24/2008    PCP: Philip Aspen, Limmie Patricia, MD   REFERRING PROVIDER: Philip Aspen, Limmie Patricia, MD  REFERRING DIAG: M54.50 (ICD-10-CM) - Acute right-sided low back pain without sciatica  Rationale for Evaluation and Treatment: Rehabilitation  THERAPY DIAG:  Other low back pain - Plan: PT plan of care cert/re-cert  Abnormal posture - Plan: PT plan of care cert/re-cert  Cramp and spasm - Plan: PT plan of care cert/re-cert  Muscle weakness (generalized) -  Plan: PT plan of care cert/re-cert  Difficulty in walking, not elsewhere classified - Plan: PT plan of care cert/re-cert  ONSET DATE: 05/01/2023  SUBJECTIVE:                                                                                                                                                                                           SUBJECTIVE STATEMENT: Patient states he's had back pain for several months but has had episodes in the past that have found him at  the doctor.  He was typically prescribed muscle relaxer and pain medication and he'd be fine in about a week.  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  He has pain with start up and prolonged standing.  He is currently still working.  He denies any pain into either leg.  He does not exercise on a regular basis due to his job duties are so taxing on him.  He describes his pain as aching, sometimes sharp.  Hurts worse with a hard day at work.  He hopes to avoid surgery and to avoid having to be out of work.     PERTINENT HISTORY:  Seeing Cardiologist for palpitations  PAIN:  Are you having pain? Yes: NPRS scale: 4 now but as bad as 9 at worst/10 Pain location: low back Pain description: aching, sharp Aggravating factors: work Relieving factors: rest, medication  PRECAUTIONS: None  RED FLAGS: None   WEIGHT BEARING RESTRICTIONS: No  FALLS:  Has patient fallen in last 6 months? Yes. 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  NEXT MD VISIT: na  OBJECTIVE:  Note: Objective measures were completed at Evaluation unless otherwise noted.  DIAGNOSTIC FINDINGS:  none  PATIENT SURVEYS:  FOTO 45, predicted 3  SCREENING FOR RED FLAGS: Bowel or bladder incontinence: No Spinal tumors: No Cauda equina syndrome: No Compression fracture: No Abdominal aneurysm: No  COGNITION: Overall cognitive status: Within functional limits for tasks assessed     SENSATION: WFL  MUSCLE LENGTH: Hamstrings: Right 45 deg; Left 40 deg Thomas test: Right pos; Left pos  POSTURE: increased lumbar lordosis   LUMBAR ROM:   AROM eval  Flexion Fingertips to ankle  Extension WFL  Right lateral flexion Fingertips to joint line  Left lateral flexion Fingertips to joint line  Right rotation WFL  Left rotation WFL   (Blank rows = not tested)  LOWER EXTREMITY ROM:     WFL  LOWER EXTREMITY MMT:    Generally 5/5 throughout bilateral LE's  LUMBAR SPECIAL TESTS:  Straight leg raise test: Negative   GAIT: Distance walked: 30 feet Assistive device utilized: None Level of assistance: Complete Independence Comments: guarded/antalgic  TODAY'S TREATMENT:                                                                                                                              DATE: 05/26/23 Initial eval completed and initiated HEP    PATIENT EDUCATION:  Education details: Initiated HEP  Person educated: Patient Education method: Explanation, Demonstration, Verbal cues, and Handouts Education comprehension: verbalized understanding, returned demonstration, and verbal cues required  HOME EXERCISE PROGRAM: Access Code: [email or web address] URL: [email or web address] Date: 05/26/2023 Prepared by: Mikey Kirschner  Exercises - Standing Hamstring Stretch on Chair  - 1 x daily - 7 x weekly - 1 sets - 3 reps - 30 sec hold - Standing Quad Stretch with Table and Chair Support   - 1 x daily - 7 x weekly - 1 sets - 3 reps - 30 sec hold - Seated Piriformis Stretch with Trunk Bend  - 1 x daily - 7 x weekly - 1 sets - 3 reps - 30 sec hold  ASSESSMENT:  CLINICAL IMPRESSION: Patient is a 57 y.o. male who was seen today for physical therapy evaluation and treatment for low back pain without radicular symptoms. He presents with decreased ROM, core strength, poor flexibility and increased pain.  He would benefit from skilled PT for posture and body mechanics training, core strengthening, LE flexibility, and pain control along with dry needling if needed for muscle spasm.    OBJECTIVE IMPAIRMENTS: decreased knowledge of condition, difficulty walking, decreased ROM, decreased strength, increased fascial restrictions, increased muscle spasms, impaired flexibility, postural dysfunction, obesity, and pain.   ACTIVITY LIMITATIONS: carrying, lifting, bending, sitting, standing, squatting, sleeping, stairs, transfers, bed mobility, bathing, and dressing  PARTICIPATION LIMITATIONS: driving, shopping, community activity, occupation, and yard work  PERSONAL FACTORS: Education, Financial risk analyst, Profession, and 1-2 comorbidities: obesity and cardiac issues recently  are also affecting patient's functional outcome.   REHAB POTENTIAL: Good  CLINICAL DECISION MAKING: Stable/uncomplicated  EVALUATION COMPLEXITY: Low   GOALS: Goals reviewed with patient? Yes  SHORT TERM GOALS: Target date: 06/23/2023  Pain report to be no greater than 4/10  Baseline: Goal status: INITIAL  2.  Patient will be independent with initial HEP  Baseline:  Goal status: INITIAL  3.  Patient to be able to keep working Baseline:  Goal status: INITIAL    LONG TERM GOALS: Target date: 07/21/2023  Patient to report pain no greater than 2/10  Baseline:  Goal status: INITIAL  2.  Patient to be independent with advanced HEP  Baseline:  Goal status: INITIAL  3.  Patient to be able to bend, stoop and squat  with pain no greater than 2/10 to be able to do his usual work Baseline:  Goal status: INITIAL  4.  Patient to be able to stand or walk for at least 30 min without need to rest due to back pain Baseline:  Goal status: INITIAL  5.  Patient to report 85% improvement in overall symptoms Baseline:  Goal status: INITIAL  6.  FOTO score to be 59 Baseline:  Goal status: INITIAL  PLAN:  PT FREQUENCY: 1-2x/week  PT DURATION: 8 weeks  PLANNED INTERVENTIONS: 97110-Therapeutic exercises, 97530- Therapeutic activity, [id number]- Neuromuscular re-education, 97535- Self Care, 73220- Manual therapy, [id number]- Gait training, [phone]- Aquatic Therapy, 97014- Electrical stimulation (unattended), [id number]- Ultrasound, 06237- Traction (mechanical), Patient/Family education, Taping, Dry Needling, Joint mobilization, Spinal mobilization, Cryotherapy, and Moist heat.  PLAN FOR NEXT SESSION: Review HEP, Nustep, begin core strengthening   Clemons Salvucci B. Antwione Picotte, PT 05/26/23 11:19 AM [facility] [address] [place], [place] [address] Phone # [phone] Fax [phone]

## 2023-06-01 NOTE — Therapy (Addendum)
OUTPATIENT PHYSICAL THERAPY THORACOLUMBAR TREATMENT   Patient Name: Shane [name] MRN: [id number] DOB:Jun 20, 1966, 57 y.o., male Today's Date: 06/02/2023  END OF SESSION:  PT End of Session - 06/02/23 1018     Visit Number 2    Date for PT Re-Evaluation 07/21/23    Authorization Type [employer] Comm PPO    Progress Note Due on Visit 10    PT Start Time 1018    PT Stop Time 1102    PT Time Calculation (min) 44 min    Activity Tolerance Patient tolerated treatment well    Behavior During Therapy WFL for tasks assessed/performed              Past Medical History:  Diagnosis Date   Chronic knee pain    GERD (gastroesophageal reflux disease)    Left shoulder pain    Obesity    Past Surgical History:  Procedure Laterality Date   carpal pummel repair Bilateral    CERVICAL DISC SURGERY  2003   per Dr. Newell Coral    COLONOSCOPY  05/10/2018   per Dr. Lavon Paganini, internal hemorrhoids and sessile serrated polyps, repeat in 3 yrs    KNEE SURGERY Right    Patient Active Problem List   Diagnosis Date Noted   Sepsis (HCC) 02/16/2020   Cellulitis of left lower extremity 02/16/2020   Vitamin D deficiency 02/04/2020   Hyperlipidemia 02/04/2020   Essential hypertension 10/07/2013   Chest pain of uncertain etiology 09/17/2013   Palpitations 09/17/2013   Acute diarrhea 01/17/2012   Acute gastroenteritis 01/17/2012   Mild dehydration 01/17/2012   Obesity 07/24/2008   GERD 07/24/2008   HEMATURIA, MICROSCOPIC, HX OF 07/24/2008    PCP: Philip Aspen, Limmie Patricia, MD   REFERRING PROVIDER: Philip Aspen, Limmie Patricia, MD  REFERRING DIAG: M54.50 (ICD-10-CM) - Acute right-sided low back pain without sciatica  Rationale for Evaluation and Treatment: Rehabilitation  THERAPY DIAG:  Other low back pain  Abnormal posture  Cramp and spasm  Muscle weakness (generalized)  Difficulty in walking, not elsewhere classified  ONSET DATE: 05/01/2023  SUBJECTIVE:                                                                                                                                                                                            SUBJECTIVE STATEMENT: Just stiff today. It's a little better.   Eval: Patient states he's had back pain for several months but has had episodes in the past that have found him at the doctor.  He was typically prescribed muscle relaxer and pain medication and he'd be fine in about a week.  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  He has pain with start up and prolonged standing.  He is currently still working.  He denies any pain into either leg.  He does not exercise on a regular basis due to his job duties are so taxing on him.  He describes his pain as aching, sometimes sharp.  Hurts worse with a hard day at work.  He hopes to avoid surgery and to avoid having to be out of work.     PERTINENT HISTORY:  Seeing Cardiologist for palpitations  PAIN:  Are you having pain? Yes: NPRS scale: 0/10 Pain location: low back Pain description: aching, sharp Aggravating factors: work Relieving factors: rest, medication  PRECAUTIONS: None  RED FLAGS: None   WEIGHT BEARING RESTRICTIONS: No  FALLS:  Has patient fallen in last 6 months? Yes. 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  NEXT MD VISIT: na  OBJECTIVE:  Note: Objective measures were completed at Evaluation unless otherwise  noted.  DIAGNOSTIC FINDINGS:  none  PATIENT SURVEYS:  FOTO 45, predicted 107  SCREENING FOR RED FLAGS: Bowel or bladder incontinence: No Spinal tumors: No Cauda equina syndrome: No Compression fracture: No Abdominal aneurysm: No  COGNITION: Overall cognitive status: Within functional limits for tasks assessed     SENSATION: WFL  MUSCLE LENGTH: Hamstrings: Right 45 deg; Left 40 deg Thomas test: Right pos; Left pos  POSTURE: increased lumbar lordosis   LUMBAR ROM:   AROM eval  Flexion Fingertips to ankle  Extension WFL  Right lateral flexion Fingertips to joint line  Left lateral flexion Fingertips to joint line  Right rotation WFL  Left rotation WFL   (Blank rows = not tested)  LOWER EXTREMITY ROM:     WFL  LOWER EXTREMITY MMT:    Generally 5/5 throughout bilateral LE's  LUMBAR SPECIAL TESTS:  Straight leg raise test: Negative   GAIT: Distance walked: 30 feet Assistive device utilized: None Level of assistance: Complete Independence Comments: guarded/antalgic  TODAY'S TREATMENT:                                                                                                                              DATE:  06/02/23 Nustep L5 x 5 HS stretch standing on 8 inch step 2x30 B Quad stretch standing with chair 2 x 30 sec B with cues to tuck pelvis Seated fig 4 2x30 sec B Tried supine quad/hip flexor stretch with strap - hurt low back PPT x  10 TA contraction 5 sec hold x 5 TA + march x 10 TA + SLR x 5 B - very difficult on R Prone lying uncomfortable - worse with pillow under stomach BKTC x 10 painful by the end Standing lumbar ext at wall x 2 - increases pain Back to wall PPT x 5 - some increased tightness in back Seated lumbar stretch on blue Theraball - 3 way: good stretch on L side Standing lumbar traction at sink x 15 sec - felt good initially but then some pain PA mobs L2 to L5/S1 Gd II were painful - worse at L4/5  05/26/23 Initial eval  completed and initiated HEP    PATIENT EDUCATION:  Education details: Initiated HEP Person educated: Patient Education method: Programmer, multimedia, Facilities manager, Verbal cues, and Handouts Education comprehension: verbalized understanding, returned demonstration, and verbal cues required  HOME EXERCISE PROGRAM: Access Code: [email or web address] URL: [email or web address] Date: 06/02/2023 Prepared by: Raynelle Fanning  Exercises - Standing Hamstring Stretch on Chair  - 1 x daily - 7 x weekly - 1 sets - 3 reps - 30 sec hold - Standing Quad Stretch with Table and Chair Support  - 1 x daily - 7 x weekly - 1 sets - 3 reps - 30 sec hold - Seated Piriformis Stretch with Trunk Bend  - 1 x daily - 7 x weekly - 1 sets - 3 reps - 30 sec hold - Supine Posterior Pelvic Tilt  - 1 x daily - 7 x weekly - 1 sets - 10 reps - 2-3 sec hold - Supine March  - 1 x daily - 7 x weekly - 2 sets - 10 reps  ASSESSMENT:  CLINICAL IMPRESSION: Patient reports some increased pain with stretching but overall said his back was a little better. The most relief he got today was with PPT in hooklying and TA + march. He has pain with too much flexion or extension. We discussed his increased lordosis and using his abdominals to decrease strain on the joints by creating a neutral spine. Initially he had some relief with forward flexion at the sink. He may benefit from a trial of manual or mechanical traction.   OBJECTIVE IMPAIRMENTS: decreased knowledge of condition, difficulty walking, decreased ROM, decreased strength, increased fascial restrictions, increased muscle spasms, impaired flexibility, postural dysfunction, obesity, and pain.   ACTIVITY LIMITATIONS: carrying, lifting, bending, sitting, standing, squatting, sleeping, stairs, transfers, bed mobility, bathing, and dressing  PARTICIPATION LIMITATIONS: driving, shopping, community activity, occupation, and yard work  PERSONAL FACTORS: Education, Financial risk analyst, Profession, and 1-2  comorbidities: obesity and cardiac issues recently  are also affecting patient's functional outcome.   REHAB POTENTIAL: Good  CLINICAL DECISION MAKING: Stable/uncomplicated  EVALUATION COMPLEXITY: Low   GOALS: Goals reviewed with patient? Yes  SHORT TERM GOALS: Target date: 06/23/2023  Pain report to be no greater than 4/10  Baseline: Goal status: INITIAL  2.  Patient will be independent with initial HEP  Baseline:  Goal status: INITIAL  3.  Patient to be able to keep working Baseline:  Goal status: INITIAL    LONG TERM GOALS: Target date: 07/21/2023  Patient to report pain no greater than 2/10  Baseline:  Goal status: INITIAL  2.  Patient to be independent with advanced HEP  Baseline:  Goal status: INITIAL  3.  Patient to be able to bend, stoop and squat with pain no greater than 2/10 to be able to do his usual work Baseline:  Goal status: INITIAL  4.  Patient to be able to  stand or walk for at least 30 min without need to rest due to back pain Baseline:  Goal status: INITIAL  5.  Patient to report 85% improvement in overall symptoms Baseline:  Goal status: INITIAL  6.  FOTO score to be 59 Baseline:  Goal status: INITIAL  PLAN:  PT FREQUENCY: 1-2x/week  PT DURATION: 8 weeks  PLANNED INTERVENTIONS: 97110-Therapeutic exercises, 97530- Therapeutic activity, [id number]- Neuromuscular re-education, 97535- Self Care, 16109- Manual therapy, [id number]- Gait training, [phone]- Aquatic Therapy, 97014- Electrical stimulation (unattended), [id number]- Ultrasound, 09811- Traction (mechanical), Patient/Family education, Taping, Dry Needling, Joint mobilization, Spinal mobilization, Cryotherapy, and Moist heat.  PLAN FOR NEXT SESSION: Print PPT and TA march for pt (system down) I texted them, Nustep, begin core strengthening   Solon Palm, PT  06/02/23 12:22 PM [facility] [address] [place], [place] [address] Phone # [phone] Fax  [phone]

## 2023-06-02 ENCOUNTER — Ambulatory Visit: Payer: BC Managed Care – PPO | Admitting: Physical Therapy

## 2023-06-02 ENCOUNTER — Ambulatory Visit (INDEPENDENT_AMBULATORY_CARE_PROVIDER_SITE_OTHER): Payer: BC Managed Care – PPO | Admitting: Podiatry

## 2023-06-02 DIAGNOSIS — R293 Abnormal posture: Secondary | ICD-10-CM | POA: Diagnosis not present

## 2023-06-02 DIAGNOSIS — R262 Difficulty in walking, not elsewhere classified: Secondary | ICD-10-CM

## 2023-06-02 DIAGNOSIS — M5459 Other low back pain: Secondary | ICD-10-CM | POA: Diagnosis not present

## 2023-06-02 DIAGNOSIS — B351 Tinea unguium: Secondary | ICD-10-CM

## 2023-06-02 DIAGNOSIS — M6281 Muscle weakness (generalized): Secondary | ICD-10-CM | POA: Diagnosis not present

## 2023-06-02 DIAGNOSIS — L84 Corns and callosities: Secondary | ICD-10-CM

## 2023-06-02 DIAGNOSIS — M79675 Pain in left toe(s): Secondary | ICD-10-CM

## 2023-06-02 DIAGNOSIS — B353 Tinea pedis: Secondary | ICD-10-CM

## 2023-06-02 DIAGNOSIS — R252 Cramp and spasm: Secondary | ICD-10-CM

## 2023-06-02 DIAGNOSIS — M79674 Pain in right toe(s): Secondary | ICD-10-CM | POA: Diagnosis not present

## 2023-06-02 MED ORDER — KETOCONAZOLE 2 % EX CREA: 1.0000 | TOPICAL_CREAM | Freq: Every day | CUTANEOUS | 3 refills | Status: AC

## 2023-06-02 MED ORDER — CICLOPIROX 8 % EX SOLN: Freq: Every day | CUTANEOUS | 1 refills | Status: AC

## 2023-06-02 MED ORDER — TOLNAFTATE 1 % EX POWD: 1.0000 | Freq: Two times a day (BID) | CUTANEOUS | 0 refills | Status: AC

## 2023-06-02 NOTE — Progress Notes (Signed)
  Subjective:  Patient ID: Shane [name], male    DOB: 03-04-1966,  MRN: [id number]  Chief Complaint  Patient presents with   Tinea Pedis    Patient is here for athletes foot    57 y.o. male presents with the above complaint. History confirmed with patient. Patient presenting with pain related to dystrophic thickened elongated nails. Patient is unable to trim own nails related to nail dystrophy and/or mobility issues.  Patient also has significant peeling skin dry flaking skin and callus present on the bottom of both feet.  Does notice itching on both feet as well.  Objective:  Physical Exam: warm, good capillary refill nail exam onychomycosis of the toenails, onycholysis, and dystrophic nails DP pulses palpable, PT pulses palpable, and protective sensation intact Left Foot:  Pain with palpation of nails due to elongation and dystrophic growth.  Right Foot: Pain with palpation of nails due to elongation and dystrophic growth.   Assessment:   1. Tinea pedis of both feet   2. Callus of foot   3. Pain due to onychomycosis of toenails of both feet       Plan:  Patient was evaluated and treated and all questions answered.  # Tinea pedis bilateral foot -Was improved but recurrence after stopping use of ketoconazole, will continue daily application. Refill sent Discussed the etiology and treatment options for tinea pedis.  Discussed topical and oral treatment.  Recommended topical treatment with 2% ketoconazole cream.  This was sent to the patient's pharmacy.  Also discussed appropriate foot hygiene, use of antifungal spray such as Tinactin in shoes, as well as cleaning her foot surfaces such as showers and bathroom floors with bleach. -I certify that this diagnosis represents a distinct and separate diagnosis that requires evaluation and treatment separate from other procedures or diagnosis  #  callus of foot Debrided the callus to both feet as a courtesy at this  visit  #Onychomycosis with pain  -Nails palliatively debrided as below. -Educated on self-care -E Rx for Penlac topical 8% solution apply once daily to all nails  Procedure: Nail Debridement Rationale: Pain Type of Debridement: manual, sharp debridement. Instrumentation: Nail nipper, rotary burr. Number of Nails: 10  Return in about 3 months (around 09/02/2023) for RFC.         Corinna Gab, DPM [facility] / [facility]

## 2023-06-16 ENCOUNTER — Encounter: Payer: Self-pay | Admitting: Physical Therapy

## 2023-06-16 ENCOUNTER — Ambulatory Visit: Payer: BC Managed Care – PPO | Attending: Internal Medicine | Admitting: Physical Therapy

## 2023-06-16 DIAGNOSIS — R252 Cramp and spasm: Secondary | ICD-10-CM | POA: Diagnosis not present

## 2023-06-16 DIAGNOSIS — M6281 Muscle weakness (generalized): Secondary | ICD-10-CM | POA: Insufficient documentation

## 2023-06-16 DIAGNOSIS — R262 Difficulty in walking, not elsewhere classified: Secondary | ICD-10-CM | POA: Insufficient documentation

## 2023-06-16 DIAGNOSIS — M5459 Other low back pain: Secondary | ICD-10-CM | POA: Diagnosis not present

## 2023-06-16 DIAGNOSIS — R293 Abnormal posture: Secondary | ICD-10-CM | POA: Insufficient documentation

## 2023-06-16 NOTE — Therapy (Signed)
OUTPATIENT PHYSICAL THERAPY THORACOLUMBAR TREATMENT   Patient Name: Shane [name] MRN: [id number] DOB:Mar 30, 1966, 57 y.o., male Today's Date: 06/16/2023  END OF SESSION:  PT End of Session - 06/16/23 1046     Visit Number 3    Date for PT Re-Evaluation 07/21/23    Authorization Type [employer]BS Comm PPO    PT Start Time 1000    PT Stop Time 1044    PT Time Calculation (min) 44 min    Activity Tolerance Patient tolerated treatment well    Behavior During Therapy WFL for tasks assessed/performed               Past Medical History:  Diagnosis Date   Chronic knee pain    GERD (gastroesophageal reflux disease)    Left shoulder pain    Obesity    Past Surgical History:  Procedure Laterality Date   carpal pummel repair Bilateral    CERVICAL DISC SURGERY  2003   per Dr. Newell Coral    COLONOSCOPY  05/10/2018   per Dr. Lavon Paganini, internal hemorrhoids and sessile serrated polyps, repeat in 3 yrs    KNEE SURGERY Right    Patient Active Problem List   Diagnosis Date Noted   Sepsis (HCC) 02/16/2020   Cellulitis of left lower extremity 02/16/2020   Vitamin D deficiency 02/04/2020   Hyperlipidemia 02/04/2020   Essential hypertension 10/07/2013   Chest pain of uncertain etiology 09/17/2013   Palpitations 09/17/2013   Acute diarrhea 01/17/2012   Acute gastroenteritis 01/17/2012   Mild dehydration 01/17/2012   Obesity 07/24/2008   GERD 07/24/2008   HEMATURIA, MICROSCOPIC, HX OF 07/24/2008    PCP: Philip Aspen, Limmie Patricia, MD   REFERRING PROVIDER: Philip Aspen, Limmie Patricia, MD  REFERRING DIAG: M54.50 (ICD-10-CM) - Acute right-sided low back pain without sciatica  Rationale for Evaluation and Treatment: Rehabilitation  THERAPY DIAG:  Other low back pain  Cramp and spasm  Difficulty in walking, not elsewhere classified  Muscle weakness (generalized)  Abnormal posture  ONSET DATE: 05/01/2023  SUBJECTIVE:                                                                                                                                                                                            SUBJECTIVE STATEMENT: Patient reports he is doing okay today. He feels more discomfort than pain. When doing exercises he feels he hurts in his Lt buttock the next day.  Eval: Patient states he's had back pain for several months but has had episodes in the past that have found him at the doctor.  He was typically prescribed muscle relaxer and pain medication and  he'd be fine in about a week.  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  He has pain with start up and prolonged standing.  He is currently still working.  He denies any pain into either leg.  He does not exercise on a regular basis due to his job duties are so taxing on him.  He describes his pain as aching, sometimes sharp.  Hurts worse with a hard day at work.  He hopes to avoid surgery and to avoid having to be out of work.     PERTINENT HISTORY:  Seeing Cardiologist for palpitations  PAIN: 06/16/2023 Are you having pain? Yes: NPRS scale: 0/10 Pain location: low back Pain description: aching, sharp Aggravating factors: work Relieving factors: rest, medication  PRECAUTIONS: None  RED FLAGS: None   WEIGHT BEARING RESTRICTIONS: No  FALLS:  Has patient fallen in last 6 months? Yes. 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  NEXT MD VISIT: na  OBJECTIVE:   Note: Objective measures were completed at Evaluation unless otherwise noted.  DIAGNOSTIC FINDINGS:  none  PATIENT SURVEYS:  FOTO 45, predicted 37  SCREENING FOR RED FLAGS: Bowel or bladder incontinence: No Spinal tumors: No Cauda equina syndrome: No Compression fracture: No Abdominal aneurysm: No  COGNITION: Overall cognitive status: Within functional limits for tasks assessed     SENSATION: WFL  MUSCLE LENGTH: Hamstrings: Right 45 deg; Left 40 deg Thomas test: Right pos; Left pos  POSTURE: increased lumbar lordosis   LUMBAR ROM:   AROM eval  Flexion Fingertips to ankle  Extension WFL  Right lateral flexion Fingertips to joint line  Left lateral flexion Fingertips to joint line  Right rotation WFL  Left rotation WFL   (Blank rows = not tested)  LOWER EXTREMITY ROM:     WFL  LOWER EXTREMITY MMT:    Generally 5/5 throughout bilateral LE's  LUMBAR SPECIAL TESTS:  Straight leg raise test: Negative   GAIT: Distance walked: 30 feet Assistive device utilized: None Level of assistance: Complete Independence Comments: guarded/antalgic  TODAY'S TREATMENT:                                                                                                                              DATE:  06/16/23 Nustep L5 x 5 - PT present to discuss status HS stretch standing on 8 inch step 2x30 B Quad stretch standing with chair 2 x 30 sec B  Seated fig 4 2x30 sec bilateral  3 way SB stretch x 8 each direction Supine LTR x 8 each direction Supine hip flexion stretch x 30 sec bilateral (more intense stretch on Rt ) Supine TFL stretch with green strap 2 x 30se on Rt ; x 30 sec Lt  PPT x 10 TA contraction 5 sec hold x 5 TA + march x 10 TA + bent knee fall out x 10 bilateral  Hooklying ball squeeze x 20 Manual Addaday to bilateral lumbar paraspinals x 8 mins   06/02/23 Nustep L5 x 5 HS stretch standing on 8 inch step 2x30 B Quad stretch standing with chair 2 x 30 sec B  with cues to tuck pelvis Seated fig 4 2x30 sec B Tried supine quad/hip flexor stretch with strap - hurt low back PPT x 10 TA contraction 5 sec hold x 5 TA + march x 10 TA + SLR x 5 B - very difficult on R Prone lying uncomfortable - worse with pillow under stomach BKTC x 10 painful by the end Standing lumbar ext at wall x 2 - increases pain Back to wall PPT x 5 - some increased tightness in back Seated lumbar stretch on blue Theraball - 3 way: good stretch on L side Standing lumbar traction at sink x 15 sec - felt good initially but then some pain PA mobs L2 to L5/S1 Gd II were painful - worse at L4/5  05/26/23 Initial eval completed and initiated HEP    PATIENT EDUCATION:  Education details: Initiated HEP Person educated: Patient Education method: Programmer, multimedia, Facilities manager, Verbal cues, and Handouts Education comprehension: verbalized understanding, returned demonstration, and verbal cues required  HOME EXERCISE PROGRAM: Access Code: [email or web address] URL: [email or web address] Date: 06/02/2023 Prepared by: Raynelle Fanning  Exercises - Standing Hamstring Stretch on Chair  - 1 x daily - 7 x weekly - 1 sets - 3 reps - 30 sec hold - Standing Quad Stretch with Table and Chair Support  - 1 x daily - 7 x weekly - 1 sets - 3 reps - 30 sec hold - Seated Piriformis Stretch with Trunk Bend  - 1 x daily - 7 x weekly - 1 sets - 3 reps - 30 sec hold - Supine Posterior Pelvic Tilt  - 1 x daily - 7 x weekly - 1 sets - 10 reps - 2-3 sec hold - Supine March  - 1 x daily - 7 x weekly - 2 sets - 10 reps  ASSESSMENT:  CLINICAL IMPRESSION: Shane [name] presents to physical therapy with no change in his back symptoms since last treatment session. He verbalized feeling increased back the day following doing his home exercises. Patient verbalized improved performance with his posterior pelvic tilts. Incorporated supine lumbar and hip mobility and patient verbalized feeling a stretch and mild increased in pain.  Patient responded favorably to manual techniques and verbalized decreased muscle tightness and discomfort. Patient will benefit from skilled PT to address the below impairments and improve overall function.    OBJECTIVE IMPAIRMENTS: decreased knowledge of condition, difficulty walking, decreased ROM, decreased strength, increased fascial restrictions, increased muscle spasms, impaired flexibility, postural dysfunction, obesity, and pain.   ACTIVITY LIMITATIONS: carrying, lifting, bending, sitting, standing, squatting, sleeping, stairs, transfers, bed mobility, bathing, and dressing  PARTICIPATION LIMITATIONS: driving, shopping, community activity, occupation, and yard work  PERSONAL FACTORS: Education, Financial risk analyst, Profession, and 1-2 comorbidities: obesity and cardiac issues recently  are also affecting patient's functional outcome.   REHAB POTENTIAL: Good  CLINICAL DECISION MAKING: Stable/uncomplicated  EVALUATION COMPLEXITY: Low   GOALS: Goals  reviewed with patient? Yes  SHORT TERM GOALS: Target date: 06/23/2023  Pain report to be no greater than 4/10  Baseline: Goal status: INITIAL  2.  Patient will be independent with initial HEP  Baseline:  Goal status: INITIAL  3.  Patient to be able to keep working Baseline:  Goal status: INITIAL    LONG TERM GOALS: Target date: 07/21/2023  Patient to report pain no greater than 2/10  Baseline:  Goal status: INITIAL  2.  Patient to be independent with advanced HEP  Baseline:  Goal status: INITIAL  3.  Patient to be able to bend, stoop and squat with pain no greater than 2/10 to be able to do his usual work Baseline:  Goal status: INITIAL  4.  Patient to be able to stand or walk for at least 30 min without need to rest due to back pain Baseline:  Goal status: INITIAL  5.  Patient to report 85% improvement in overall symptoms Baseline:  Goal status: INITIAL  6.  FOTO score to be 59 Baseline:  Goal status:  INITIAL  PLAN:  PT FREQUENCY: 1-2x/week  PT DURATION: 8 weeks  PLANNED INTERVENTIONS: 97110-Therapeutic exercises, 97530- Therapeutic activity, [id number]- Neuromuscular re-education, 97535- Self Care, 91478- Manual therapy, [id number]- Gait training, [phone]- Aquatic Therapy, 97014- Electrical stimulation (unattended), [id number]- Ultrasound, 13086- Traction (mechanical), Patient/Family education, Taping, Dry Needling, Joint mobilization, Spinal mobilization, Cryotherapy, and Moist heat.  PLAN FOR NEXT SESSION: assess tolerance to treatment session; continue progressing core strengthening; manual as indicated   Claude Manges, PT 06/16/23 10:54 AM  [facility] [address], [address] [place], [place] [address] Phone # [phone] Fax [phone]

## 2023-06-22 NOTE — Therapy (Signed)
OUTPATIENT PHYSICAL THERAPY THORACOLUMBAR TREATMENT   Patient Name: Shane [name] MRN: [id number] DOB:January 26, 1966, 57 y.o., male Today's Date: 06/23/2023  END OF SESSION:  PT End of Session - 06/23/23 0931     Visit Number 4    Date for PT Re-Evaluation 07/21/23    Authorization Type [employer] Comm PPO    Progress Note Due on Visit 10    PT Start Time 0932    PT Stop Time 1019    PT Time Calculation (min) 47 min    Activity Tolerance Patient tolerated treatment well    Behavior During Therapy [facility] for tasks assessed/performed                Past Medical History:  Diagnosis Date   Chronic knee pain    GERD (gastroesophageal reflux disease)    Left shoulder pain    Obesity    Past Surgical History:  Procedure Laterality Date   carpal pummel repair Bilateral    CERVICAL DISC SURGERY  2003   per Dr. Newell Coral    COLONOSCOPY  05/10/2018   per Dr. Lavon Paganini, internal hemorrhoids and sessile serrated polyps, repeat in 3 yrs    KNEE SURGERY Right    Patient Active Problem List   Diagnosis Date Noted   Sepsis (HCC) 02/16/2020   Cellulitis of left lower extremity 02/16/2020   Vitamin D deficiency 02/04/2020   Hyperlipidemia 02/04/2020   Essential hypertension 10/07/2013   Chest pain of uncertain etiology 09/17/2013   Palpitations 09/17/2013   Acute diarrhea 01/17/2012   Acute gastroenteritis 01/17/2012   Mild dehydration 01/17/2012   Obesity 07/24/2008   GERD 07/24/2008   HEMATURIA, MICROSCOPIC, HX OF 07/24/2008    PCP: Philip Aspen, Limmie Patricia, MD   REFERRING PROVIDER: Philip Aspen, Limmie Patricia, MD  REFERRING DIAG: M54.50 (ICD-10-CM) - Acute right-sided low back pain without sciatica  Rationale for Evaluation and Treatment: Rehabilitation  THERAPY DIAG:  Other low back pain  Cramp and spasm  Difficulty in walking, not elsewhere classified  Muscle weakness (generalized)  Abnormal posture  ONSET DATE: 05/01/2023  SUBJECTIVE:                                                                                                                                                                                            SUBJECTIVE STATEMENT: It still feels really tight. The massager helped loosen the muscles but it didn't last. Today the tightness is on the right side.  Eval: Patient states he's had back pain for several months but has had episodes in the past that have found him at the doctor.  He was typically prescribed  muscle relaxer and pain medication and he'd be fine in about a week.  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  He has pain with start up and prolonged standing.  He is currently still working.  He denies any pain into either leg.  He does not exercise on a regular basis due to his job duties are so taxing on him.  He describes his pain as aching, sometimes sharp.  Hurts worse with a hard day at work.  He hopes to avoid surgery and to avoid having to be out of work.     PERTINENT HISTORY:  Seeing Cardiologist for palpitations  PAIN: 06/16/2023 Are you having pain? Yes: NPRS scale: 0/10 Pain location: low back Pain description: aching, sharp Aggravating factors: work Relieving factors: rest, medication  PRECAUTIONS: None  RED FLAGS: None   WEIGHT BEARING RESTRICTIONS: No  FALLS:  Has patient fallen in last 6 months? Yes. 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  NEXT MD VISIT:  na  OBJECTIVE:  Note: Objective measures were completed at Evaluation unless otherwise noted.  DIAGNOSTIC FINDINGS:  none  PATIENT SURVEYS:  FOTO 45, predicted 4  SCREENING FOR RED FLAGS: Bowel or bladder incontinence: No Spinal tumors: No Cauda equina syndrome: No Compression fracture: No Abdominal aneurysm: No  COGNITION: Overall cognitive status: Within functional limits for tasks assessed     SENSATION: WFL  MUSCLE LENGTH: Hamstrings: Right 45 deg; Left 40 deg Thomas test: Right pos; Left pos  POSTURE: increased lumbar lordosis   LUMBAR ROM:   AROM eval  Flexion Fingertips to ankle  Extension WFL  Right lateral flexion Fingertips to joint line  Left lateral flexion Fingertips to joint line  Right rotation WFL  Left rotation WFL   (Blank rows = not tested)  LOWER EXTREMITY ROM:     WFL  LOWER EXTREMITY MMT:    Generally 5/5 throughout bilateral LE's  LUMBAR SPECIAL TESTS:  Straight leg raise test: Negative   GAIT: Distance walked: 30 feet Assistive device utilized: None Level of assistance: Complete Independence Comments: guarded/antalgic  TODAY'S TREATMENT:                                                                                                                              DATE:  06/22/23 Nustep L8 x 5 - PT present to discuss status Supine LTR x 8 each direction TA + march x 10 Sequential march x 5 B SKTC x 20 sec  B IASTM with Addaday to bilateral lumbar paraspinals and R gluteals x 4 mins Manual R QL release in sidelying Mechanical traction (wt 270#) started at 80 but dropped to 70#/50#  60 sec on/off x 10 min  06/16/23 Nustep L5 x 5 - PT present to discuss status HS stretch standing on 8 inch step 2x30 B Quad stretch standing with chair 2 x 30 sec B  Seated fig 4 2x30 sec bilateral 3 way SB stretch x 8 each direction Supine LTR x 8 each direction Supine hip flexion stretch x 30 sec bilateral (more intense stretch on Rt  ) Supine TFL stretch with green strap 2 x 30se on Rt ; x 30 sec Lt  PPT x 10 TA contraction 5 sec hold x 5 TA + march x 10 TA + bent knee fall out x 10 bilateral  Hooklying ball squeeze x 20 Manual Addaday to bilateral lumbar paraspinals x 8 mins   06/02/23 Nustep L5 x 5 HS stretch standing on 8 inch step 2x30 B Quad stretch standing with chair 2 x 30 sec B with cues to tuck pelvis Seated fig 4 2x30 sec B Tried supine quad/hip flexor stretch with strap - hurt low back PPT x 10 TA contraction 5 sec hold x 5 TA + march x 10 TA + SLR x 5 B - very difficult on R Prone lying uncomfortable - worse with pillow under stomach BKTC x 10 painful by the end Standing lumbar ext at wall x 2 - increases pain Back to wall PPT x 5 - some increased tightness in back Seated lumbar stretch on blue Theraball - 3 way: good stretch on L side Standing lumbar traction at sink x 15 sec - felt good initially but then some pain PA mobs L2 to L5/S1 Gd II were painful - worse at L4/5  05/26/23 Initial eval completed and initiated HEP    PATIENT EDUCATION:  Education details: Initiated HEP Person educated: Patient Education method: Programmer, multimedia, Facilities manager, Verbal cues, and Handouts Education comprehension: verbalized understanding, returned demonstration, and verbal cues required  HOME EXERCISE PROGRAM: Access Code: [email or web address] URL: [email or web address] Date: 06/02/2023 Prepared by: Raynelle Fanning  Exercises - Standing Hamstring Stretch on Chair  - 1 x daily - 7 x weekly - 1 sets - 3 reps - 30 sec hold - Standing Quad Stretch with Table and Chair Support  - 1 x daily - 7 x weekly - 1 sets - 3 reps - 30 sec hold - Seated Piriformis Stretch with Trunk Bend  - 1 x daily - 7 x weekly - 1 sets - 3 reps - 30 sec hold - Supine Posterior Pelvic Tilt  - 1 x daily - 7 x weekly - 1 sets - 10 reps - 2-3 sec hold - Supine March  - 1 x daily - 7 x weekly - 2 sets - 10 reps  ASSESSMENT:  CLINICAL  IMPRESSION: Nedra Hai continues to report tightness and stiffness. Still getting some relief with TA with march. We progressed this today with sequential march which was challenging. His pain today was more R sided. His R QL was very tight. He reported relief with TPR to his QL and would likely benefit from DN to his lumbar and gluteals next visit. He wanted to pray on it and I provided him with the DN handout and advised he can google it as well. Also advised him to use the ball release technique in the QL. Initial trial of mechanical traction was done at 70#  as 80# produced some slight pain. He reported he felt less tension at end of session.     OBJECTIVE IMPAIRMENTS: decreased knowledge of condition, difficulty walking, decreased ROM, decreased strength, increased fascial restrictions, increased muscle spasms, impaired flexibility, postural dysfunction, obesity, and pain.   ACTIVITY LIMITATIONS: carrying, lifting, bending, sitting, standing, squatting, sleeping, stairs, transfers, bed mobility, bathing, and dressing  PARTICIPATION LIMITATIONS: driving, shopping, community activity, occupation, and yard work  PERSONAL FACTORS: Education, Financial risk analyst, Profession, and 1-2 comorbidities: obesity and cardiac issues recently  are also affecting patient's functional outcome.   REHAB POTENTIAL: Good  CLINICAL DECISION MAKING: Stable/uncomplicated  EVALUATION COMPLEXITY: Low   GOALS: Goals reviewed with patient? Yes  SHORT TERM GOALS: Target date: 06/23/2023  Pain report to be no greater than 4/10  Baseline: Goal status: IN PROGRESS  2.  Patient will be independent with initial HEP  Baseline:  Goal status: MET  3.  Patient to be able to keep working Baseline:  Goal status: MET    LONG TERM GOALS: Target date: 07/21/2023  Patient to report pain no greater than 2/10  Baseline:  Goal status: INITIAL  2.  Patient to be independent with advanced HEP  Baseline:  Goal status: INITIAL  3.   Patient to be able to bend, stoop and squat with pain no greater than 2/10 to be able to do his usual work Baseline:  Goal status: INITIAL  4.  Patient to be able to stand or walk for at least 30 min without need to rest due to back pain Baseline:  Goal status: INITIAL  5.  Patient to report 85% improvement in overall symptoms Baseline:  Goal status: INITIAL  6.  FOTO score to be 59 Baseline:  Goal status: INITIAL  PLAN:  PT FREQUENCY: 1-2x/week  PT DURATION: 8 weeks  PLANNED INTERVENTIONS: 97110-Therapeutic exercises, 97530- Therapeutic activity, [id number]- Neuromuscular re-education, 97535- Self Care, [id number]- Manual therapy, [id number]- Gait training, [phone]- Aquatic Therapy, 97014- Electrical stimulation (unattended), [id number]- Ultrasound, 01027- Traction (mechanical), Patient/Family education, Taping, Dry Needling, Joint mobilization, Spinal mobilization, Cryotherapy, and Moist heat.  PLAN FOR NEXT SESSION: assess tolerance to mech traction and progress with increaesed pull if indicated, DN to bil lumbar, QL and gluteals if pt agreeable; continue progressing core strengthening; manual/IASTM as indicated   Solon Palm, PT  06/23/23 11:14 AM  [facility] [address], [address] [place], [place] [address] Phone # [phone] Fax [phone]

## 2023-06-23 ENCOUNTER — Other Ambulatory Visit (INDEPENDENT_AMBULATORY_CARE_PROVIDER_SITE_OTHER): Payer: BC Managed Care – PPO

## 2023-06-23 ENCOUNTER — Ambulatory Visit: Payer: BC Managed Care – PPO | Admitting: Physical Therapy

## 2023-06-23 ENCOUNTER — Encounter: Payer: Self-pay | Admitting: Physical Therapy

## 2023-06-23 ENCOUNTER — Other Ambulatory Visit: Payer: BC Managed Care – PPO

## 2023-06-23 DIAGNOSIS — R293 Abnormal posture: Secondary | ICD-10-CM | POA: Diagnosis not present

## 2023-06-23 DIAGNOSIS — M6281 Muscle weakness (generalized): Secondary | ICD-10-CM

## 2023-06-23 DIAGNOSIS — R252 Cramp and spasm: Secondary | ICD-10-CM

## 2023-06-23 DIAGNOSIS — M5459 Other low back pain: Secondary | ICD-10-CM | POA: Diagnosis not present

## 2023-06-23 DIAGNOSIS — E785 Hyperlipidemia, unspecified: Secondary | ICD-10-CM | POA: Diagnosis not present

## 2023-06-23 DIAGNOSIS — R262 Difficulty in walking, not elsewhere classified: Secondary | ICD-10-CM | POA: Diagnosis not present

## 2023-06-23 LAB — LIPID PANEL
Cholesterol: 166 mg/dL (ref 0–200)
HDL: 39.3 mg/dL (ref >39.00)
LDL Cholesterol: 115 mg/dL — ABNORMAL HIGH (ref 0–99)
NonHDL: 127.01
Total CHOL/HDL Ratio: 4
Triglycerides: 60 mg/dL (ref 0.0–149.0)
VLDL: 12 mg/dL (ref 0.0–40.0)

## 2023-06-26 ENCOUNTER — Other Ambulatory Visit: Payer: Self-pay | Admitting: *Deleted

## 2023-06-26 DIAGNOSIS — E785 Hyperlipidemia, unspecified: Secondary | ICD-10-CM

## 2023-06-26 MED ORDER — ATORVASTATIN CALCIUM 80 MG PO TABS: 80.0000 mg | ORAL_TABLET | Freq: Every day | ORAL | 1 refills | Status: AC

## 2023-06-30 ENCOUNTER — Ambulatory Visit: Payer: BC Managed Care – PPO

## 2023-06-30 DIAGNOSIS — M6281 Muscle weakness (generalized): Secondary | ICD-10-CM | POA: Diagnosis not present

## 2023-06-30 DIAGNOSIS — R252 Cramp and spasm: Secondary | ICD-10-CM | POA: Diagnosis not present

## 2023-06-30 DIAGNOSIS — R262 Difficulty in walking, not elsewhere classified: Secondary | ICD-10-CM | POA: Diagnosis not present

## 2023-06-30 DIAGNOSIS — M5459 Other low back pain: Secondary | ICD-10-CM

## 2023-06-30 DIAGNOSIS — R293 Abnormal posture: Secondary | ICD-10-CM | POA: Diagnosis not present

## 2023-06-30 NOTE — Therapy (Signed)
OUTPATIENT PHYSICAL THERAPY THORACOLUMBAR TREATMENT   Patient Name: Shane [name] MRN: [id number] DOB:[birth year], 57 y.o., male Today's Date: 06/30/2023  END OF SESSION:  PT End of Session - 06/30/23 0938     Visit Number 5    Date for PT Re-Evaluation 07/21/23    Authorization Type [employer] Comm PPO    Progress Note Due on Visit 10    PT Start Time 0932    PT Stop Time 1015    PT Time Calculation (min) 43 min    Activity Tolerance Patient tolerated treatment well    Behavior During Therapy [facility] for tasks assessed/performed                Past Medical History:  Diagnosis Date   Chronic knee pain    GERD (gastroesophageal reflux disease)    Left shoulder pain    Obesity    Past Surgical History:  Procedure Laterality Date   carpal pummel repair Bilateral    CERVICAL DISC SURGERY  2003   per Dr. Newell Coral    COLONOSCOPY  05/10/2018   per Dr. Lavon Paganini, internal hemorrhoids and sessile serrated polyps, repeat in 3 yrs    KNEE SURGERY Right    Patient Active Problem List   Diagnosis Date Noted   Sepsis (HCC) 02/16/2020   Cellulitis of left lower extremity 02/16/2020   Vitamin D deficiency 02/04/2020   Hyperlipidemia 02/04/2020   Essential hypertension 10/07/2013   Chest pain of uncertain etiology 09/17/2013   Palpitations 09/17/2013   Acute diarrhea 01/17/2012   Acute gastroenteritis 01/17/2012   Mild dehydration 01/17/2012   Obesity 07/24/2008   GERD 07/24/2008   HEMATURIA, MICROSCOPIC, HX OF 07/24/2008    PCP: Philip Aspen, Limmie Patricia, MD   REFERRING PROVIDER: Philip Aspen, Limmie Patricia, MD  REFERRING DIAG: M54.50 (ICD-10-CM) - Acute right-sided low back pain without sciatica  Rationale for Evaluation and Treatment: Rehabilitation  THERAPY DIAG:  Other low back pain  Cramp and spasm  Difficulty in walking, not elsewhere classified  Muscle weakness (generalized)  Abnormal posture  ONSET DATE: 05/01/2023  SUBJECTIVE:                                                                                                                                                                                            SUBJECTIVE STATEMENT: Patient reports continued discomfort but he is having to continue working.  The traction helped a little.  Does not want to do DN today.  Unsure if he'll have increased pain from it.    Eval: Patient states he's had back pain for several months but has had episodes in the  past that have found him at the doctor.  He was typically prescribed muscle relaxer and pain medication and he'd be fine in about a week.  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  He has pain with start up and prolonged standing.  He is currently still working.  He denies any pain into either leg.  He does not exercise on a regular basis due to his job duties are so taxing on him.  He describes his pain as aching, sometimes sharp.  Hurts worse with a hard day at work.  He hopes to avoid surgery and to avoid having to be out of work.     PERTINENT HISTORY:  Seeing Cardiologist for palpitations  PAIN: 06/16/2023 Are you having pain? Yes: NPRS scale: 0/10 Pain location: low back Pain description: aching, sharp Aggravating factors: work Relieving factors: rest, medication  PRECAUTIONS: None  RED FLAGS: None   WEIGHT BEARING RESTRICTIONS: No  FALLS:  Has patient fallen in last 6 months? Yes. 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  NEXT MD VISIT: na  OBJECTIVE:  Note: Objective measures were completed at Evaluation unless otherwise noted.  DIAGNOSTIC FINDINGS:  none  PATIENT SURVEYS:  FOTO 45, predicted 72  SCREENING FOR RED FLAGS: Bowel or bladder incontinence: No Spinal tumors: No Cauda equina syndrome: No Compression fracture: No Abdominal aneurysm: No  COGNITION: Overall cognitive status: Within functional limits for tasks assessed     SENSATION: WFL  MUSCLE LENGTH: Hamstrings: Right 45 deg; Left 40 deg Thomas test: Right pos; Left pos  POSTURE: increased lumbar lordosis   LUMBAR ROM:   AROM eval  Flexion Fingertips to ankle  Extension WFL  Right lateral flexion Fingertips to joint line  Left lateral flexion Fingertips to joint line  Right rotation WFL  Left rotation WFL   (Blank rows = not tested)  LOWER EXTREMITY ROM:     WFL  LOWER EXTREMITY MMT:    Generally 5/5 throughout bilateral LE's  LUMBAR SPECIAL TESTS:  Straight leg raise test: Negative   GAIT: Distance walked: 30 feet Assistive device utilized: None Level of assistance: Complete Independence Comments: guarded/antalgic  TODAY'S TREATMENT:                                                                                                                              DATE:  06/30/23 Nustep x 5 min level 8 (PT present to discuss status) Standing hamstring stretch 3 x 30  sec each LE Standing quad/hip flexor stretch 3 x 30 sec each LE Supine PPT x 20 (patient needing decreased verbal cues for correct technique) PPT with 90 /90 heel tap PPT with dying bug PPT with star crunch  Ice to lumbar spine x 10 min  06/22/23 Nustep L8 x 5 - PT present to discuss status Supine LTR x 8 each direction TA + march x 10 Sequential march x 5 B SKTC x 20 sec B IASTM with Addaday to bilateral lumbar paraspinals and R gluteals x 4 mins Manual R QL release in sidelying Mechanical traction  (wt 270#) started at 80 but dropped to 70#/50#  60 sec on/off x 10 min  06/16/23 Nustep L5 x 5 - PT present to discuss status HS stretch standing on 8 inch step 2x30 B Quad stretch standing with chair 2 x 30 sec B  Seated fig 4 2x30 sec bilateral 3 way SB stretch x 8 each direction Supine LTR x 8 each direction Supine hip flexion stretch x 30 sec bilateral (more intense stretch on Rt ) Supine TFL stretch with green strap 2 x 30se on Rt ; x 30 sec Lt  PPT x 10 TA contraction 5 sec hold x 5 TA + march x 10 TA + bent knee fall out x 10 bilateral  Hooklying ball squeeze x 20 Manual Addaday to bilateral lumbar paraspinals x 8 mins  05/26/23 Initial eval completed and initiated HEP    PATIENT EDUCATION:  Education details: Initiated HEP Person educated: Patient Education method: Programmer, multimedia, Facilities manager, Verbal cues, and Handouts Education comprehension: verbalized understanding, returned demonstration, and verbal cues required  HOME EXERCISE PROGRAM: Access Code: [email or web address] URL: [email or web address] Date: 06/30/2023 Prepared by: Mikey Kirschner  Exercises - Standing Hamstring Stretch on Chair  - 1 x daily - 7 x weekly - 1 sets - 3 reps - 30 sec hold - Standing Quad Stretch with Table and Chair Support  - 1 x daily - 7 x weekly - 1 sets - 3 reps - 30 sec hold - Seated Piriformis Stretch with Trunk Bend  - 1 x daily - 7 x weekly - 1 sets - 3 reps - 30 sec hold - Supine Posterior Pelvic Tilt  - 1 x daily - 7 x weekly - 1 sets - 10 reps - 2-3 sec hold - Supine March  - 1 x daily - 7 x weekly - 2 sets - 10 reps - Hooklying Sequential Leg March and Lower  - 1 x daily - 3-4 x weekly - 1-3 sets - 10 reps - Supine Single Knee to Chest Stretch  - 2 x daily - 7 x weekly - 1 sets - 3 reps - 20 sec hold - Supine 90/90 Alternating Heel Touches with Posterior Pelvic Tilt  - 1 x daily - 7 x weekly - 2 sets - 10 reps - Supine Dead Bug with Leg Extension  - 1 x daily - 7 x weekly -  2 sets - 10 reps  Patient Education - Trigger Point Dry Needling ASSESSMENT:  CLINICAL IMPRESSION: Nedra Hai continues to have some pain but has been able to continue working which was his primary concern.  He explains that he tries to be cautious and is working on using better Estate manager/land agent.  We added some challenging core work today to see if we could further stabilize the lumbar spine.  He admits he got a little behind on his stretching this past week but he has been diligent with his exercises  otherwise.  Compliance with HEP is good.  He is able to replicate his exercises correctly and recalls these with ease.  He should continue to improve.  He would benefit from continued skilled PT for LE flexibility and core strengthening.     OBJECTIVE IMPAIRMENTS: decreased knowledge of condition, difficulty walking, decreased ROM, decreased strength, increased fascial restrictions, increased muscle spasms, impaired flexibility, postural dysfunction, obesity, and pain.   ACTIVITY LIMITATIONS: carrying, lifting, bending, sitting, standing, squatting, sleeping, stairs, transfers, bed mobility, bathing, and dressing  PARTICIPATION LIMITATIONS: driving, shopping, community activity, occupation, and yard work  PERSONAL FACTORS: Education, Financial risk analyst, Profession, and 1-2 comorbidities: obesity and cardiac issues recently  are also affecting patient's functional outcome.   REHAB POTENTIAL: Good  CLINICAL DECISION MAKING: Stable/uncomplicated  EVALUATION COMPLEXITY: Low   GOALS: Goals reviewed with patient? Yes  SHORT TERM GOALS: Target date: 06/23/2023  Pain report to be no greater than 4/10  Baseline: Goal status: IN PROGRESS  2.  Patient will be independent with initial HEP  Baseline:  Goal status: MET  3.  Patient to be able to keep working Baseline:  Goal status: MET    LONG TERM GOALS: Target date: 07/21/2023  Patient to report pain no greater than 2/10  Baseline:  Goal status:  INITIAL  2.  Patient to be independent with advanced HEP  Baseline:  Goal status: INITIAL  3.  Patient to be able to bend, stoop and squat with pain no greater than 2/10 to be able to do his usual work Baseline:  Goal status: INITIAL  4.  Patient to be able to stand or walk for at least 30 min without need to rest due to back pain Baseline:  Goal status: INITIAL  5.  Patient to report 85% improvement in overall symptoms Baseline:  Goal status: INITIAL  6.  FOTO score to be 59 Baseline:  Goal status: INITIAL  PLAN:  PT FREQUENCY: 1-2x/week  PT DURATION: 8 weeks  PLANNED INTERVENTIONS: 97110-Therapeutic exercises, 97530- Therapeutic activity, [id number]- Neuromuscular re-education, 97535- Self Care, 16109- Manual therapy, [id number]- Gait training, [phone]- Aquatic Therapy, 97014- Electrical stimulation (unattended), [id number]- Ultrasound, 09811- Traction (mechanical), Patient/Family education, Taping, Dry Needling, Joint mobilization, Spinal mobilization, Cryotherapy, and Moist heat.  PLAN FOR NEXT SESSION: Review body mechanics and sitting posture.  Continue LE flexibility, possibly add ball rolls, progressing core strengthening; manual/IASTM as indicated.  Re-visit traction if needed.  DN if patient is agreeable (he wanted to hold off today)   Victorino Dike B. Shaquon Gropp, PT 06/30/23 10:23 AM [facility] [address] [place], [place] [address] Phone # [phone] Fax [phone]

## 2023-07-14 NOTE — Progress Notes (Signed)
CARDIOLOGY CONSULT NOTE       Patient ID: Shane [name] MRN: [id number] DOB/AGE: 07/24/1965 58 y.o.  Primary Physician: Philip Aspen, Limmie Patricia, MD Primary Cardiologist: Eden Emms   HPI:  58 y.o. first seen by me 02/06/23 at [facility] for chest pain.  Associated with some palpitations he R/O Echo was normal with EF 60-65% no valve dx and normal RV Cardiac CTA with calcium score 43 no obstructive CAD < 25%  Monitor done 04/04/23 with self limited SVT longest 17 seconds Started on Toprol 12.5 mg daily   Labs with normal TSH, LDL 115 Hct 43  He is overweight, with GERD and HLD He has a physically demanding job Surveyor, minerals pipes/wires underground   BP a bit high Discussed getting a BP cuff at home and increasing Norvasc to 5 mg  ROS All other systems reviewed and negative except as noted above  Past Medical History:  Diagnosis Date   Chronic knee pain    GERD (gastroesophageal reflux disease)    Left shoulder pain    Obesity     Family History  Problem Relation Age of Onset   Heart disease Mother        mitrial valve repair, CABG    Prostate cancer Father    Hypertension Father    Colon cancer Father        in his early 91's   Esophageal cancer Neg Hx    Rectal cancer Neg Hx    Stomach cancer Neg Hx     Social History   Socioeconomic History   Marital status: Married    Spouse name: Not on file   Number of children: 3   Years of education: Not on file   Highest education level: Not on file  Occupational History   Occupation: [employer]   Tobacco Use   Smoking status: Never   Smokeless tobacco: Never  Vaping Use   Vaping status: Never Used  Substance and Sexual Activity   Alcohol use: No    Alcohol/week: 0.0 standard drinks of alcohol   Drug use: No   Sexual activity: Not on file  Other Topics Concern   Not on file  Social History Narrative   Not on file   Social Drivers of Health   Financial Resource Strain: Not on file  Food Insecurity: No Food  Insecurity (02/06/2023)   Hunger Vital Sign    Worried About Running Out of Food in the Last Year: Never true    Ran Out of Food in the Last Year: Never true  Transportation Needs: No Transportation Needs (02/06/2023)   PRAPARE - Administrator, Civil Service (Medical): No    Lack of Transportation (Non-Medical): No  Physical Activity: Not on file  Stress: Not on file  Social Connections: Unknown (11/22/2021)   [facility]   Social Network    Social Network: Not on file  Intimate Partner Violence: Not At Risk (02/06/2023)   Humiliation, Afraid, Rape, and Kick questionnaire    Fear of Current or Ex-Partner: No    Emotionally Abused: No    Physically Abused: No    Sexually Abused: No    Past Surgical History:  Procedure Laterality Date   carpal pummel repair Bilateral    CERVICAL DISC SURGERY  2003   per Dr. Newell Coral    COLONOSCOPY  05/10/2018   per Dr. Lavon Paganini, internal hemorrhoids and sessile serrated polyps, repeat in 3 yrs    KNEE SURGERY Right  Current Outpatient Medications:    aspirin EC 81 MG tablet, Take 1 tablet (81 mg total) by mouth daily. Swallow whole., Disp: 30 tablet, Rfl: 3   ciclopirox (PENLAC) 8 % solution, Apply topically at bedtime. Apply over nail and surrounding skin. Apply daily over previous coat. After seven (7) days, may remove with alcohol and continue cycle., Disp: 6.6 mL, Rfl: 0   ciclopirox (PENLAC) 8 % solution, Apply topically at bedtime. Apply over nail and surrounding skin. Apply daily over previous coat. After seven (7) days, may remove with alcohol and continue cycle., Disp: 6.6 mL, Rfl: 1   cyclobenzaprine (FLEXERIL) 5 MG tablet, Take 1 tablet (5 mg total) by mouth at bedtime as needed for muscle spasms., Disp: 30 tablet, Rfl: 0   ketoconazole (NIZORAL) 2 % cream, Apply 1 Application topically daily., Disp: 60 g, Rfl: 1   ketoconazole (NIZORAL) 2 % cream, Apply 1 Application topically daily., Disp:  60 g, Rfl: 3   meloxicam (MOBIC) 7.5 MG tablet, Take 1 tablet (7.5 mg total) by mouth daily., Disp: 30 tablet, Rfl: 0   nitroGLYCERIN (NITROSTAT) 0.4 MG SL tablet, Place 1 tablet (0.4 mg total) under the tongue every 5 (five) minutes x 3 doses as needed for chest pain., Disp: 20 tablet, Rfl: 1   predniSONE (STERAPRED UNI-PAK 21 TAB) 10 MG (21) TBPK tablet, Take as directed, Disp: 21 tablet, Rfl: 0   tolnaftate (TINACTIN) 1 % powder, Apply 1 Application topically 2 (two) times daily., Disp: 45 g, Rfl: 0   amLODipine (NORVASC) 2.5 MG tablet, Take 1 tablet (2.5 mg total) by mouth daily. (Patient not taking: Reported on 07/28/2023), Disp: 90 tablet, Rfl: 3   atorvastatin (LIPITOR) 80 MG tablet, Take 1 tablet (80 mg total) by mouth daily. (Patient not taking: Reported on 07/28/2023), Disp: 90 tablet, Rfl: 1    Physical Exam: Blood pressure 136/86, pulse 62, height 5\' 10"  (1.778 m), weight 267 lb (121.1 kg), SpO2 96%.   Affect appropriate Healthy:  appears stated age HEENT: normal Neck supple with no adenopathy JVP normal no bruits no thyromegaly Lungs clear with no wheezing and good diaphragmatic motion Heart:  S1/S2 no murmur, no rub, gallop or click PMI normal Abdomen: benighn, BS positve, no tenderness, no AAA no bruit.  No HSM or HJR Distal pulses intact with no bruits No edema Neuro non-focal Skin warm and dry No muscular weakness   Labs:   Lab Results  Component Value Date   WBC 6.7 03/15/2023   HGB 14.8 03/15/2023   HCT 44.8 03/15/2023   MCV 86.6 03/15/2023   PLT 175.0 03/15/2023   No results for input(s): "NA", "K", "CL", "CO2", "BUN", "CREATININE", "CALCIUM", "PROT", "BILITOT", "ALKPHOS", "ALT", "AST", "GLUCOSE" in the last 168 hours.  Invalid input(s): "LABALBU" No results found for: "CKTOTAL", "CKMB", "CKMBINDEX", "TROPONINI"  Lab Results  Component Value Date   CHOL 166 06/23/2023   CHOL 176 03/15/2023   CHOL 174 02/07/2023   Lab Results  Component Value Date    HDL 39.30 06/23/2023   HDL 35.30 (L) 03/15/2023   HDL 33 (L) 02/07/2023   Lab Results  Component Value Date   LDLCALC 115 (H) 06/23/2023   LDLCALC 122 (H) 03/15/2023   LDLCALC 125 (H) 02/07/2023   Lab Results  Component Value Date   TRIG 60.0 06/23/2023   TRIG 90.0 03/15/2023   TRIG 78 02/07/2023   Lab Results  Component Value Date   CHOLHDL 4 06/23/2023   CHOLHDL 5 03/15/2023  CHOLHDL 5.3 02/07/2023   No results found for: "LDLDIRECT"    Radiology: No results found.  EKG: SR voltage for LVH S wave and T inversion lead 3   ASSESSMENT AND PLAN:   Chest pain: non cardiac R/O echo normal EF no RWMA;s cardiac CTA 02/07/23 non obstrucitve dx Palpitations: continue beta blocker self limited SVT on monitor on low dose Toprol HTN:  normal continue norvasc at higher dose 5 mg , beta blocker  HLD:  on statin LDL 115 calcium score 43 66 th percentile total plaque volume stage 1 mild 150 mm3  Back Pain:  PT, meloxicam and prednisone pulse Rx per primary   Norvasc increase to 5 mg   F/U in a year   Signed: Charlton Haws 07/28/2023, 8:24 AM

## 2023-07-21 ENCOUNTER — Ambulatory Visit: Payer: BC Managed Care – PPO | Attending: Internal Medicine | Admitting: Physical Therapy

## 2023-07-21 ENCOUNTER — Encounter: Payer: Self-pay | Admitting: Physical Therapy

## 2023-07-21 DIAGNOSIS — M6281 Muscle weakness (generalized): Secondary | ICD-10-CM | POA: Insufficient documentation

## 2023-07-21 DIAGNOSIS — R252 Cramp and spasm: Secondary | ICD-10-CM | POA: Insufficient documentation

## 2023-07-21 DIAGNOSIS — R262 Difficulty in walking, not elsewhere classified: Secondary | ICD-10-CM | POA: Diagnosis not present

## 2023-07-21 DIAGNOSIS — M5459 Other low back pain: Secondary | ICD-10-CM | POA: Insufficient documentation

## 2023-07-21 DIAGNOSIS — R293 Abnormal posture: Secondary | ICD-10-CM | POA: Diagnosis not present

## 2023-07-21 NOTE — Therapy (Signed)
 OUTPATIENT PHYSICAL THERAPY THORACOLUMBAR TREATMENT / RE-CERTIFICATION   Patient Name: Shane [name] MRN: [id number] DOB:07/16/1965, 58 y.o., male Today's Date: 07/21/2023  END OF SESSION:  PT End of Session - 07/21/23 1156     Visit Number 6    Date for PT Re-Evaluation 08/25/23    Authorization Type BCBS Comm PPO    PT Start Time 1018    PT Stop Time 1100    PT Time Calculation (min) 42 min    Activity Tolerance Treatment limited secondary to medical complications (Comment)    Behavior During Therapy [facility] for tasks assessed/performed                 Past Medical History:  Diagnosis Date   Chronic knee pain    GERD (gastroesophageal reflux disease)    Left shoulder pain    Obesity    Past Surgical History:  Procedure Laterality Date   carpal pummel repair Bilateral    CERVICAL DISC SURGERY  2003   per Dr. Alix    COLONOSCOPY  05/10/2018   per Dr. Shila, internal hemorrhoids and sessile serrated polyps, repeat in 3 yrs    KNEE SURGERY Right    Patient Active Problem List   Diagnosis Date Noted   Sepsis (HCC) 02/16/2020   Cellulitis of left lower extremity 02/16/2020   Vitamin D  deficiency 02/04/2020   Hyperlipidemia 02/04/2020   Essential hypertension 10/07/2013   Chest pain of uncertain etiology 09/17/2013   Palpitations 09/17/2013   Acute diarrhea 01/17/2012   Acute gastroenteritis 01/17/2012   Mild dehydration 01/17/2012   Obesity 07/24/2008   GERD 07/24/2008   HEMATURIA, MICROSCOPIC, HX OF 07/24/2008    PCP: Theophilus Andrews, Tully GRADE, MD   REFERRING PROVIDER: Theophilus Andrews, Tully GRADE, MD  REFERRING DIAG: M54.50 (ICD-10-CM) - Acute right-sided low back pain without sciatica  Rationale for Evaluation and Treatment: Rehabilitation  THERAPY DIAG:  Other low back pain  Cramp and spasm  Difficulty in walking, not elsewhere classified  Muscle weakness (generalized)  Abnormal posture  ONSET DATE: 05/01/2023  SUBJECTIVE:                                                                                                                                                                                            SUBJECTIVE STATEMENT: Patient reports his back pain is better, but he is having increased pain in his Rt hip. For the last three days she has felt a sharp pain whenever he took a step with his right leg.   Eval: Patient states he's had back pain for several months but has had episodes in the past  that have found him at the doctor.  He was typically prescribed muscle relaxer and pain medication and he'd be fine in about a week.  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  He has pain with start up and prolonged standing.  He is currently still working.  He denies any pain into either leg.  He does not exercise on a regular basis due to his job duties are so taxing on him.  He describes his pain as aching, sometimes sharp.  Hurts worse with a hard day at work.  He hopes to avoid surgery and to avoid having to be out of work.     PERTINENT HISTORY:  Seeing Cardiologist for palpitations  PAIN: 07/21/2023 Are you having pain? Yes: NPRS scale: 0; 7 with movement/10 Pain location: low back Pain description: aching, sharp Aggravating factors: work Relieving factors: rest, medication  PRECAUTIONS: None  RED FLAGS: None   WEIGHT BEARING RESTRICTIONS: No  FALLS:  Has patient fallen in last 6 months? Yes. 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  NEXT MD VISIT: na  OBJECTIVE:  Note: Objective measures were completed at Evaluation unless otherwise noted.  DIAGNOSTIC FINDINGS:  none  PATIENT SURVEYS:  FOTO 45, predicted 59 07/21/2023 : 58  SCREENING FOR RED FLAGS: Bowel or bladder incontinence: No Spinal tumors: No Cauda equina syndrome: No Compression fracture: No Abdominal aneurysm: No  COGNITION: Overall cognitive status: Within functional limits for tasks assessed     SENSATION: WFL  MUSCLE LENGTH: Hamstrings: Right 45 deg; Left 40 deg Thomas test: Right pos; Left pos  POSTURE: increased lumbar lordosis   LUMBAR ROM:   AROM eval  Flexion Fingertips to ankle  Extension WFL  Right lateral flexion Fingertips to joint line  Left lateral flexion Fingertips to joint line  Right rotation WFL  Left rotation WFL   (Blank rows = not tested)  LOWER EXTREMITY ROM:     WFL  LOWER EXTREMITY MMT:    Generally 5/5 throughout bilateral LE's  LUMBAR SPECIAL TESTS:  Straight leg raise test: Negative   GAIT: Distance walked: 30 feet Assistive device utilized: None Level of assistance: Complete Independence Comments: guarded/antalgic  TODAY'S TREATMENT:                                                                                                                              DATE:  07/21/2023 Nustep x 5 min level 8 (PT present to discuss status) FOTO:  58 Goal Assessment Supine hip flexion stretch with green strap 3 x 20sec Rt  Supine TFL stretch with green strap 3 x 20 sec Rt  Bridges  x 10 increased back spasms Supine hip flexion + TA contraction x 10 each 3 way SB stretch x 8 each direction Seated hamstring stretch x 30 sec bilateral  Manual: Addaday to Rt hip musculature for improved circulation. PT present to monitor status   06/30/23 Nustep x 5 min level 8 (PT present to discuss status) Standing hamstring stretch 3 x 30  sec each LE Standing quad/hip flexor stretch 3 x 30 sec each LE Supine PPT x 20 (patient needing decreased verbal cues for correct technique) PPT with 90 /90 heel tap PPT with dying bug PPT with star crunch  Ice to lumbar spine x 10 min  06/22/23 Nustep L8 x 5 - PT present to discuss status Supine LTR x 8 each direction TA + march x 10 Sequential march x 5 B SKTC x 20 sec B IASTM with Addaday to bilateral lumbar paraspinals and R gluteals x 4 mins Manual R QL release in sidelying Mechanical traction (wt 270#) started at 80 but dropped to 70#/50#  60 sec on/off x 10 min  06/16/23 Nustep L5 x 5 - PT present to discuss status HS stretch standing on 8 inch step 2x30 B Quad stretch standing with chair 2 x 30 sec B  Seated fig 4 2x30 sec bilateral 3 way SB stretch x 8 each direction Supine LTR x 8 each direction Supine hip flexion stretch x 30 sec bilateral (more intense stretch on Rt ) Supine TFL stretch with green strap 2 x 30se on Rt ; x 30 sec Lt  PPT x 10 TA contraction 5 sec hold x 5 TA + march x 10 TA + bent knee fall out x 10 bilateral  Hooklying ball squeeze x 20 Manual Addaday to bilateral lumbar paraspinals x 8 mins  05/26/23 Initial eval completed and initiated HEP    PATIENT EDUCATION:  Education details: Initiated HEP Person educated: Patient Education method: Programmer, Multimedia, Facilities Manager, Verbal cues, and Handouts Education comprehension: verbalized understanding, returned demonstration, and verbal cues required  HOME EXERCISE PROGRAM: Access Code: [email or web address] URL: [email or web address] Date: 07/21/2023 Prepared by: Kristeen Sar  Exercises - Standing Hamstring Stretch on Chair  - 1 x daily - 7 x weekly - 1 sets - 3 reps - 30 sec hold - Standing Quad Stretch with Table and Chair Support  - 1 x daily - 7 x weekly - 1 sets - 3 reps - 30 sec hold - Seated Piriformis Stretch with Trunk Bend  - 1 x daily - 7 x weekly - 1 sets - 3 reps - 30 sec hold -  Supine Posterior Pelvic Tilt  - 1 x daily - 7 x weekly - 1 sets - 10 reps - 2-3 sec hold - Supine March  - 1 x daily - 7 x weekly - 2 sets - 10 reps - Hooklying Sequential Leg March and Lower  - 1 x daily - 3-4 x weekly - 1-3 sets - 10 reps - Supine Single Knee to Chest Stretch  - 2 x daily - 7 x weekly - 1 sets - 3 reps - 20 sec hold - Supine 90/90 Alternating Heel Touches with Posterior Pelvic Tilt  - 1 x daily - 7 x weekly - 2 sets - 10 reps - Supine Dead Bug with Leg Extension  - 1 x daily - 7 x weekly -  2 sets - 10 reps - Hip Flexion Stretch  - 1 x daily - 7 x weekly - 2 sets - 30 hold - Supine ITB Stretch with Strap  - 1 x daily - 7 x weekly - 2 sets - 30 hold  Patient Education - Trigger Point Dry Needling  Patient Education - Trigger Point Dry Needling ASSESSMENT:  CLINICAL IMPRESSION: Jama verbalized feeling 50% better since starting therapy. His low back pain levels have improved, however his right hip pain has not decreased. He is compliant with his HEP and he has tolerated core progressions well. He is able to do his work duties but he has to take rest breaks after 30 minutes. At times when he is standing or sitting stationary when he goes to move again he has increased pain. Educated patient to make an appointment with PCP to discuss imaging to determine if there is anything structural going on in his spine. Patient would continue to benefit from skilled therapy to address Rt hip pain. Patient responded well to manual techniques and verbalized some relief.    OBJECTIVE IMPAIRMENTS: decreased knowledge of condition, difficulty walking, decreased ROM, decreased strength, increased fascial restrictions, increased muscle spasms, impaired flexibility, postural dysfunction, obesity, and pain.   ACTIVITY LIMITATIONS: carrying, lifting, bending, sitting, standing, squatting, sleeping, stairs, transfers, bed mobility, bathing, and dressing  PARTICIPATION LIMITATIONS: driving, shopping,  community activity, occupation, and yard work  PERSONAL FACTORS: Education, Financial Risk Analyst, Profession, and 1-2 comorbidities: obesity and cardiac issues recently  are also affecting patient's functional outcome.   REHAB POTENTIAL: Good  CLINICAL DECISION MAKING: Stable/uncomplicated  EVALUATION COMPLEXITY: Low   GOALS: Goals reviewed with patient? Yes  SHORT TERM GOALS: Target date: 06/23/2023  Pain report to be no greater than 4/10  Baseline: Goal status: IN PROGRESS 07/21/2023  2.  Patient will be independent with initial HEP  Baseline:  Goal status: MET  3.  Patient to be able to keep working Baseline:  Goal status: MET    LONG TERM GOALS: Target date: 08/25/2023   Patient to report pain no greater than 2/10  Baseline:  Goal status: IN PROGRESS 07/21/2023  2.  Patient to be independent with advanced HEP  Baseline:  Goal status: IN PROGRESS 07/21/2023  3.  Patient to be able to bend, stoop and squat with pain no greater than 2/10 to be able to do his usual work Baseline:  Goal status: IN PROGRESS 07/21/2023  4.  Patient to be able to stand or walk for at least 30 min without need to rest due to back pain Baseline:  Goal status: MET 07/21/2023  5.  Patient to report 85% improvement in overall symptoms Baseline:  Goal status: IN PROGRESS 07/21/2023  6.  FOTO score to be 59 Baseline: 58 Goal status: IN PROGRESS 07/21/2023  PLAN:  PT FREQUENCY: 1-2x/week  PT DURATION: other: 5 weeks  PLANNED INTERVENTIONS: 97110-Therapeutic exercises, 97530- Therapeutic activity, 97112- Neuromuscular re-education, 97535- Self Care, 02859- Manual therapy, [id number]- Gait training, [phone]- Aquatic Therapy, [id number]- Electrical stimulation (unattended), [id number]- Ultrasound, 02987- Traction (mechanical), Patient/Family education, Taping, Dry Needling, Joint mobilization, Spinal mobilization, Cryotherapy, and Moist heat.  PLAN FOR NEXT SESSION: Review body mechanics and sitting posture.  Continue LE  flexibility, possibly add ball rolls, progressing core strengthening; manual/IASTM as indicated.  Re-visit traction if needed.    Kristeen Sar, PT 07/21/23 11:57 AM [facility] [address], [address] [place], [place] [address] Phone # [phone] Fax [phone]

## 2023-07-27 NOTE — Therapy (Signed)
OUTPATIENT PHYSICAL THERAPY THORACOLUMBAR TREATMENT   Patient Name: Shane [name] MRN: [id number] DOB:05/01/1966, 58 y.o., male Today's Date: 07/28/2023  END OF SESSION:  PT End of Session - 07/28/23 1016     Visit Number 7    Date for PT Re-Evaluation 08/25/23    Authorization Type [employer] PPO    Progress Note Due on Visit 10    PT Start Time 1016    PT Stop Time 1105    PT Time Calculation (min) 49 min    Behavior During Therapy WFL for tasks assessed/performed              Past Medical History:  Diagnosis Date   Chronic knee pain    GERD (gastroesophageal reflux disease)    Left shoulder pain    Obesity    Past Surgical History:  Procedure Laterality Date   carpal pummel repair Bilateral    CERVICAL DISC SURGERY  2003   per Dr. Newell Coral    COLONOSCOPY  05/10/2018   per Dr. Lavon Paganini, internal hemorrhoids and sessile serrated polyps, repeat in 3 yrs    KNEE SURGERY Right    Patient Active Problem List   Diagnosis Date Noted   Sepsis (HCC) 02/16/2020   Cellulitis of left lower extremity 02/16/2020   Vitamin D deficiency 02/04/2020   Hyperlipidemia 02/04/2020   Essential hypertension 10/07/2013   Chest pain of uncertain etiology 09/17/2013   Palpitations 09/17/2013   Acute diarrhea 01/17/2012   Acute gastroenteritis 01/17/2012   Mild dehydration 01/17/2012   Obesity 07/24/2008   GERD 07/24/2008   HEMATURIA, MICROSCOPIC, HX OF 07/24/2008    PCP: Philip Aspen, Limmie Patricia, MD   REFERRING PROVIDER: Philip Aspen, Limmie Patricia, MD  REFERRING DIAG: M54.50 (ICD-10-CM) - Acute right-sided low back pain without sciatica  Rationale for Evaluation and Treatment: Rehabilitation  THERAPY DIAG:  Other low back pain  Cramp and spasm  Difficulty in walking, not elsewhere classified  Muscle weakness (generalized)  Abnormal posture  ONSET DATE: 05/01/2023  SUBJECTIVE:                                                                                                                                                                                            SUBJECTIVE STATEMENT: Pain continues to be focused in the R hip. Back feels good today. 5/10 in the hip today, but more of a discomfort than pain.   Eval: Patient states he's had back pain for several months but has had episodes in the past that have found him at the doctor.  He was typically prescribed muscle relaxer and pain medication and he'd be  fine in about a week.  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  He has pain with start up and prolonged standing.  He is currently still working.  He denies any pain into either leg.  He does not exercise on a regular basis due to his job duties are so taxing on him.  He describes his pain as aching, sometimes sharp.  Hurts worse with a hard day at work.  He hopes to avoid surgery and to avoid having to be out of work.     PERTINENT HISTORY:  Seeing Cardiologist for palpitations  PAIN: 07/21/2023 Are you having pain? Yes: NPRS scale: 0; 7 with movement/10 Pain location: low back Pain description: aching, sharp Aggravating factors: work Relieving factors: rest, medication  PRECAUTIONS: None  RED FLAGS: None   WEIGHT BEARING RESTRICTIONS: No  FALLS:  Has patient fallen in last 6 months? Yes. 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  NEXT MD VISIT: na  OBJECTIVE:  Note: Objective measures were  completed at Evaluation unless otherwise noted.  DIAGNOSTIC FINDINGS:  none  PATIENT SURVEYS:  FOTO 45, predicted 59 07/21/2023 : 58  SCREENING FOR RED FLAGS: Bowel or bladder incontinence: No Spinal tumors: No Cauda equina syndrome: No Compression fracture: No Abdominal aneurysm: No  COGNITION: Overall cognitive status: Within functional limits for tasks assessed     SENSATION: WFL  MUSCLE LENGTH: Hamstrings: Right 45 deg; Left 40 deg Thomas test: Right pos; Left pos  POSTURE: increased lumbar lordosis   LUMBAR ROM:   AROM eval  Flexion Fingertips to ankle  Extension WFL  Right lateral flexion Fingertips to joint line  Left lateral flexion Fingertips to joint line  Right rotation WFL  Left rotation WFL   (Blank rows = not tested)  LOWER EXTREMITY ROM:     WFL   LOWER EXTREMITY MMT:    Generally 5/5 throughout bilateral LE's  LUMBAR SPECIAL TESTS:  Straight leg raise test: Negative  07/28/23 - Assessed R hip - + hip scour (ant/med), + FABER, decreased hip flexion to approximately 90 deg with pain and decreased IR   GAIT: Distance walked: 30 feet Assistive device utilized: None Level of assistance: Complete Independence Comments: guarded/antalgic  TODAY'S TREATMENT:                                                                                                                              DATE:  07/28/2023 Nustep x 5 min level 5 (  PT present to discuss status) Hooklying wide feet - Hip IR/ER 20 sec holds x 2 (only feels IR stretch on R) Self Care: reinforced previous PT's advice to f/u with MD regarding imaging for R hip and back; used visual images to explain hip joint and surrounding musculature as it relates to his pain. Also advised use of self MFR using ball or fist to hip muscles. Manual: Assessed R hip - + hip scour (ant/med), + FABER, decreased passive hip flexion to approximately 90 deg with pain R long leg distraction with belt 3x 30 sec R  hip distraction with belt 3x30 sec P/A mobs to R hip in prone, superior mobs in flex and ADD - painful TPR to R gluteals and piriformis in prone with active hip IR/ER; also did TPR in S/L   Addaday to Rt hip musculature in prone to decrease pain and spasm   07/21/2023 Nustep x 5 min level 8 (PT present to discuss status) FOTO: 58 Goal Assessment Supine hip flexion stretch with green strap 3 x 20sec Rt  Supine TFL stretch with green strap 3 x 20 sec Rt  Bridges  x 10 increased back spasms Supine hip flexion + TA contraction x 10 each 3 way SB stretch x 8 each direction Seated hamstring stretch x 30 sec bilateral  Manual: Addaday to Rt hip musculature for improved circulation. PT present to monitor status   06/30/23 Nustep x 5 min level 8 (PT present to discuss status) Standing hamstring stretch 3 x 30 sec each LE Standing quad/hip flexor stretch 3 x 30 sec each LE Supine PPT x 20 (patient needing decreased verbal cues for correct technique) PPT with 90 /90 heel tap PPT with dying bug PPT with star crunch  Ice to lumbar spine x 10 min  06/22/23 Nustep L8 x 5 - PT present to discuss status Supine LTR x 8 each direction TA + march x 10 Sequential march x 5 B SKTC x 20 sec B IASTM with Addaday to bilateral lumbar paraspinals and R gluteals x 4 mins Manual R QL release in sidelying Mechanical traction (wt 270#) started at 80 but dropped to 70#/50#  60 sec on/off x 10 min  06/16/23 Nustep L5 x 5 - PT present to discuss status HS stretch standing on 8 inch step 2x30 B Quad stretch standing with chair 2 x 30 sec B  Seated fig 4 2x30 sec bilateral 3 way SB stretch x 8 each direction Supine LTR x 8 each direction Supine hip flexion stretch x 30 sec bilateral (more intense stretch on Rt ) Supine TFL stretch with green strap 2 x 30se on Rt ; x 30 sec Lt  PPT x 10 TA contraction 5 sec hold x 5 TA + march x 10 TA + bent knee fall out x 10 bilateral  Hooklying ball squeeze x  20 Manual Addaday to bilateral lumbar paraspinals x 8 mins  05/26/23 Initial eval completed and initiated HEP    PATIENT EDUCATION:  Education details: Initiated HEP Person educated: Patient Education method: Programmer, multimedia, Facilities manager, Verbal cues, and Handouts Education comprehension: verbalized understanding, returned demonstration, and verbal cues required  HOME EXERCISE PROGRAM: Access Code: [email or web address] URL: [email or web address] Date: 07/21/2023 Prepared by: Claude Manges  Exercises - Standing Hamstring Stretch on Chair  - 1 x daily - 7 x weekly - 1 sets - 3 reps - 30 sec hold - Standing Quad Stretch with Table and Chair Support  - 1 x daily - 7 x weekly -  1 sets - 3 reps - 30 sec hold - Seated Piriformis Stretch with Trunk Bend  - 1 x daily - 7 x weekly - 1 sets - 3 reps - 30 sec hold - Supine Posterior Pelvic Tilt  - 1 x daily - 7 x weekly - 1 sets - 10 reps - 2-3 sec hold - Supine March  - 1 x daily - 7 x weekly - 2 sets - 10 reps - Hooklying Sequential Leg March and Lower  - 1 x daily - 3-4 x weekly - 1-3 sets - 10 reps - Supine Single Knee to Chest Stretch  - 2 x daily - 7 x weekly - 1 sets - 3 reps - 20 sec hold - Supine 90/90 Alternating Heel Touches with Posterior Pelvic Tilt  - 1 x daily - 7 x weekly - 2 sets - 10 reps - Supine Dead Bug with Leg Extension  - 1 x daily - 7 x weekly - 2 sets - 10 reps - Hip Flexion Stretch  - 1 x daily - 7 x weekly - 2 sets - 30 hold - Supine ITB Stretch with Strap  - 1 x daily - 7 x weekly - 2 sets - 30 hold  Patient Education - Trigger Point Dry Needling  Patient Education - Trigger Point Dry Needling ASSESSMENT:  CLINICAL IMPRESSION: Patient continues to report R hip pain. Low back has been feeling good. Assessment of R hip reveals + hip scour (ant/med), + FABER, decreased hip flexion to approximately 90 deg with pain and decreased IR. Patient had some relief with long leg distraction, but surrounding musculature  remained tight. Trial of DN encouraged, but patient is resistent to this modality, so we proceeded with TPR and IASTM with some relief. He did feel less discomfort at the end of session than when he came in . Findings suggest intraarticular issues and PT suggested he f/u with PCP as previous PT did as well. He plans to do this.    OBJECTIVE IMPAIRMENTS: decreased knowledge of condition, difficulty walking, decreased ROM, decreased strength, increased fascial restrictions, increased muscle spasms, impaired flexibility, postural dysfunction, obesity, and pain.   ACTIVITY LIMITATIONS: carrying, lifting, bending, sitting, standing, squatting, sleeping, stairs, transfers, bed mobility, bathing, and dressing  PARTICIPATION LIMITATIONS: driving, shopping, community activity, occupation, and yard work  PERSONAL FACTORS: Education, Financial risk analyst, Profession, and 1-2 comorbidities: obesity and cardiac issues recently  are also affecting patient's functional outcome.   REHAB POTENTIAL: Good  CLINICAL DECISION MAKING: Stable/uncomplicated  EVALUATION COMPLEXITY: Low   GOALS: Goals reviewed with patient? Yes  SHORT TERM GOALS: Target date: 06/23/2023  Pain report to be no greater than 4/10  Baseline: Goal status: IN PROGRESS 07/21/2023  2.  Patient will be independent with initial HEP  Baseline:  Goal status: MET  3.  Patient to be able to keep working Baseline:  Goal status: MET    LONG TERM GOALS: Target date: 08/25/2023   Patient to report pain no greater than 2/10  Baseline:  Goal status: IN PROGRESS 07/21/2023  2.  Patient to be independent with advanced HEP  Baseline:  Goal status: IN PROGRESS 07/21/2023  3.  Patient to be able to bend, stoop and squat with pain no greater than 2/10 to be able to do his usual work Baseline:  Goal status: IN PROGRESS 07/21/2023  4.  Patient to be able to stand or walk for at least 30 min without need to rest due to back pain Baseline:  Goal  status: MET 07/21/2023  5.  Patient to report 85% improvement in overall symptoms Baseline:  Goal status: IN PROGRESS 07/21/2023  6.  FOTO score to be 59 Baseline: 58 Goal status: IN PROGRESS 07/21/2023  PLAN:  PT FREQUENCY: 1-2x/week  PT DURATION: other: 5 weeks  PLANNED INTERVENTIONS: 97110-Therapeutic exercises, 97530- Therapeutic activity, 97112- Neuromuscular re-education, 97535- Self Care, 98119- Manual therapy, [id number]- Gait training, [phone]- Aquatic Therapy, [id number]- Electrical stimulation (unattended), [id number]- Ultrasound, 95621- Traction (mechanical), Patient/Family education, Taping, Dry Needling, Joint mobilization, Spinal mobilization, Cryotherapy, and Moist heat.  PLAN FOR NEXT SESSION: Continue to focus on R hip mobility, distraction/mobs as tolerated. Review body mechanics and sitting posture.  Continue LE flexibility, possibly add ball rolls, progressing core strengthening; manual/IASTM as indicated.  Re-visit traction if needed.    Solon Palm, PT 07/28/23 11:28 AM [facility] [address] [place], [place] [address] Phone # [phone] Fax [phone]

## 2023-07-28 ENCOUNTER — Encounter: Payer: Self-pay | Admitting: Cardiovascular Disease

## 2023-07-28 ENCOUNTER — Ambulatory Visit: Payer: BC Managed Care – PPO | Admitting: Physical Therapy

## 2023-07-28 ENCOUNTER — Ambulatory Visit: Payer: BC Managed Care – PPO | Attending: Cardiovascular Disease | Admitting: Cardiovascular Disease

## 2023-07-28 ENCOUNTER — Encounter: Payer: Self-pay | Admitting: Physical Therapy

## 2023-07-28 VITALS — BP 136/86 | HR 62 | Ht 70.0 in | Wt 267.0 lb

## 2023-07-28 DIAGNOSIS — R293 Abnormal posture: Secondary | ICD-10-CM

## 2023-07-28 DIAGNOSIS — M5459 Other low back pain: Secondary | ICD-10-CM

## 2023-07-28 DIAGNOSIS — M6281 Muscle weakness (generalized): Secondary | ICD-10-CM | POA: Diagnosis not present

## 2023-07-28 DIAGNOSIS — R252 Cramp and spasm: Secondary | ICD-10-CM

## 2023-07-28 DIAGNOSIS — E782 Mixed hyperlipidemia: Secondary | ICD-10-CM

## 2023-07-28 DIAGNOSIS — R0789 Other chest pain: Secondary | ICD-10-CM | POA: Diagnosis not present

## 2023-07-28 DIAGNOSIS — R002 Palpitations: Secondary | ICD-10-CM

## 2023-07-28 DIAGNOSIS — R262 Difficulty in walking, not elsewhere classified: Secondary | ICD-10-CM | POA: Diagnosis not present

## 2023-07-28 MED ORDER — AMLODIPINE BESYLATE 5 MG PO TABS: 5.0000 mg | ORAL_TABLET | Freq: Every day | ORAL | 3 refills | Status: DC

## 2023-07-28 NOTE — Patient Instructions (Addendum)
Medication Instructions:  Your physician has recommended you make the following change in your medication:  1-INCREASE Amlodipine 5 mg by mouth daily.  *If you need a refill on your cardiac medications before your next appointment, please call your pharmacy*  Lab Work: If you have labs (blood work) drawn today and your tests are completely normal, you will receive your results only by: MyChart Message (if you have MyChart) OR A paper copy in the mail If you have any lab test that is abnormal or we need to change your treatment, we will call you to review the results.  Testing/Procedures: None ordered today.  Follow-Up: At [facility], you and your health needs are our priority.  As part of our continuing mission to provide you with exceptional heart care, we have created designated Provider Care Teams.  These Care Teams include your primary Cardiologist (physician) and Advanced Practice Providers (APPs -  Physician Assistants and Nurse Practitioners) who all work together to provide you with the care you need, when you need it.  We recommend signing up for the patient portal called "MyChart".  Sign up information is provided on this After Visit Summary.  MyChart is used to connect with patients for Virtual Visits (Telemedicine).  Patients are able to view lab/test results, encounter notes, upcoming appointments, etc.  Non-urgent messages can be sent to your provider as well.   To learn more about what you can do with MyChart, go to [email or web address].    Your next appointment:   1 year(s)  Provider:   Charlton Haws, MD     Other Instructions

## 2023-08-04 ENCOUNTER — Encounter: Payer: Self-pay | Admitting: Physical Therapy

## 2023-08-04 ENCOUNTER — Ambulatory Visit: Payer: BC Managed Care – PPO | Admitting: Physical Therapy

## 2023-08-04 DIAGNOSIS — M6281 Muscle weakness (generalized): Secondary | ICD-10-CM

## 2023-08-04 DIAGNOSIS — R293 Abnormal posture: Secondary | ICD-10-CM

## 2023-08-04 DIAGNOSIS — R262 Difficulty in walking, not elsewhere classified: Secondary | ICD-10-CM

## 2023-08-04 DIAGNOSIS — R252 Cramp and spasm: Secondary | ICD-10-CM | POA: Diagnosis not present

## 2023-08-04 DIAGNOSIS — M5459 Other low back pain: Secondary | ICD-10-CM

## 2023-08-04 NOTE — Therapy (Addendum)
 OUTPATIENT PHYSICAL THERAPY THORACOLUMBAR TREATMENT  PHYSICAL THERAPY DISCHARGE SUMMARY  Visits from Start of Care: 8  Current functional level related to goals / functional outcomes: See below- patient did not return after this visit   Remaining deficits: See below   Education / Equipment: See below   Patient agrees to discharge. Patient goals were partially met. Patient is being discharged due to not returning since the last visit.   Patient Name: Shane [name] MRN: [id number] DOB:11/20/1965, 58 y.o., male Today's Date: 02/13/2024  END OF SESSION:      Past Medical History:  Diagnosis Date   Chronic knee pain    GERD (gastroesophageal reflux disease)    Left shoulder pain    Obesity    Past Surgical History:  Procedure Laterality Date   carpal pummel repair Bilateral    CERVICAL DISC SURGERY  2003   per Dr. Alix    COLONOSCOPY  05/10/2018   per Dr. Shila, internal hemorrhoids and sessile serrated polyps, repeat in 3 yrs    KNEE SURGERY Right    Patient Active Problem List   Diagnosis Date Noted   Sepsis (HCC) 02/16/2020   Cellulitis of left lower extremity 02/16/2020   Vitamin D  deficiency 02/04/2020   Hyperlipidemia 02/04/2020   Essential hypertension 10/07/2013   Chest pain of uncertain etiology 09/17/2013   Palpitations 09/17/2013   Acute diarrhea 01/17/2012   Acute gastroenteritis 01/17/2012   Mild dehydration 01/17/2012   Obesity 07/24/2008   GERD 07/24/2008   HEMATURIA, MICROSCOPIC, HX OF 07/24/2008    PCP: Theophilus Andrews, Tully GRADE, MD   REFERRING PROVIDER: Theophilus Andrews, Tully GRADE, MD  REFERRING DIAG: M54.50 (ICD-10-CM) - Acute right-sided low back pain without sciatica  Rationale for Evaluation and Treatment: Rehabilitation  THERAPY DIAG:  Other low back pain  Cramp and spasm  Difficulty in walking, not elsewhere classified  Muscle weakness (generalized)  Abnormal posture  ONSET DATE: 05/01/2023  SUBJECTIVE:                                                                                                                                                                                            SUBJECTIVE STATEMENT: Patient reports he is moving a little slow today. His hip pain is aggravating his back which was doing better. He has not had a chance to contact his MD about setting up an appointment.  Eval: Patient states he's had back pain for several months but has had episodes in the past that have found him at the doctor.  He was typically prescribed muscle relaxer and pain medication and he'd be fine in about a week.  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  He has pain with start up and prolonged standing.  He is currently still working.  He denies any pain into either leg.  He does not exercise on a regular basis due to his job duties are so taxing on him.  He describes his pain as aching, sometimes sharp.  Hurts worse with a hard day at work.  He hopes to avoid surgery and to avoid having to be out of work.     PERTINENT HISTORY:  Seeing Cardiologist for palpitations  PAIN: 08/04/2023 Are you having pain? Yes: NPRS scale: 8/10 Pain location: low back Pain description: aching, sharp Aggravating factors: work Relieving factors: rest, medication  PRECAUTIONS: None  RED FLAGS: None   WEIGHT BEARING RESTRICTIONS: No  FALLS:  Has patient fallen in last 6 months? Yes. 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  NEXT MD VISIT: na  OBJECTIVE:  Note: Objective measures were completed at Evaluation unless otherwise noted.  DIAGNOSTIC FINDINGS:  none  PATIENT SURVEYS:  FOTO 45, predicted 59 07/21/2023 : 58  SCREENING FOR RED FLAGS: Bowel or bladder incontinence: No Spinal tumors: No Cauda equina syndrome: No Compression fracture: No Abdominal aneurysm: No  COGNITION: Overall cognitive status: Within functional limits for tasks assessed     SENSATION: WFL  MUSCLE LENGTH: Hamstrings: Right 45 deg; Left 40 deg Thomas test: Right pos; Left pos  POSTURE: increased lumbar lordosis   LUMBAR ROM:   AROM eval  Flexion Fingertips to ankle  Extension WFL  Right lateral flexion Fingertips to joint line  Left lateral flexion Fingertips to joint line  Right rotation WFL  Left rotation WFL   (Blank rows = not tested)  LOWER EXTREMITY ROM:     WFL   LOWER EXTREMITY MMT:    Generally 5/5 throughout bilateral LE's  LUMBAR SPECIAL TESTS:  Straight leg raise test: Negative  07/28/23 - Assessed R hip - + hip scour (ant/med), + FABER, decreased hip flexion to approximately 90 deg with pain and decreased IR   GAIT: Distance walked: 30 feet Assistive device utilized: None Level of assistance: Complete Independence Comments: guarded/antalgic  TODAY'S TREATMENT:                                                                                                                              DATE:  08/04/2023 Nustep x 6 min level 3 (PT present to discuss status) Hooklying wide feet -  Hip IR/ER 20 sec holds x 2 (increased pain)  Hip flexion stretch 2 x 30 sec bilateral  Supine gluteal stretch 2 x 30 sec bilateral  Supine LTR x 10 each side While performing exercises above patient had a moist heat pack on lumbar spine & Rt hip Hip ROM toe taps (forwards,side, back- this felt good Lt foot on 2inch step Rt foot hanging down to feel the  pull of gravity (circles/sideways)- this felt good Manual: Rt hip lateral mobs with belt -this felt good R long leg distraction 3x 30 sec  Supine superior mobs in flex and ADD - painful   Addaday to Rt hip musculature in sidelying to decrease pain and spasm     07/28/2023 Nustep x 5 min level 5 (PT present to discuss status) Hooklying wide feet - Hip IR/ER 20 sec holds x 2 (only feels IR stretch on R) Self Care: reinforced previous PT's advice to f/u with MD regarding imaging for R hip and back; used visual images to explain hip joint and surrounding musculature as it relates to his pain. Also advised use of self MFR using ball or fist to hip muscles. Manual: Assessed R hip - + hip scour (ant/med), + FABER, decreased passive hip flexion to approximately 90 deg with pain R long leg distraction with belt 3x 30 sec R hip distraction with belt 3x30 sec P/A mobs to R hip in prone, superior mobs in flex and ADD - painful TPR to R gluteals and piriformis in prone with active hip IR/ER; also did TPR in S/L   Addaday to Rt hip musculature in prone to decrease pain and spasm   07/21/2023 Nustep x 5 min level 8 (PT present to discuss status) FOTO: 58 Goal Assessment Supine hip flexion stretch with green strap 3 x 20sec Rt  Supine TFL stretch with green strap 3 x 20 sec Rt  Bridges  x 10 increased back spasms Supine hip flexion + TA contraction x 10 each 3 way SB stretch x 8 each direction Seated hamstring stretch x 30 sec bilateral  Manual: Addaday to Rt hip musculature for improved circulation. PT present to monitor status    PATIENT EDUCATION:  Education details: Initiated HEP Person educated: Patient Education method: Programmer, multimedia, Facilities manager, Verbal cues, and Handouts Education comprehension: verbalized understanding, returned demonstration, and verbal cues required  HOME EXERCISE PROGRAM: Access Code: [email or web address] URL: [email or web address] Date: 07/21/2023 Prepared  by: Kristeen Sar  Exercises - Standing Hamstring Stretch on Chair  - 1 x daily - 7 x weekly - 1 sets - 3 reps - 30 sec hold - Standing Quad Stretch with Table and Chair Support  - 1 x daily - 7 x weekly - 1 sets - 3 reps - 30 sec hold - Seated Piriformis Stretch with Trunk Bend  - 1 x daily - 7 x weekly - 1 sets - 3 reps - 30 sec hold - Supine Posterior Pelvic Tilt  - 1 x daily - 7 x weekly - 1 sets - 10 reps - 2-3 sec hold - Supine March  - 1 x daily - 7 x weekly - 2 sets - 10 reps - Hooklying Sequential Leg March and Lower  - 1 x daily - 3-4 x weekly - 1-3 sets - 10 reps - Supine Single Knee to Chest Stretch  - 2 x daily - 7 x weekly - 1 sets - 3 reps - 20 sec hold - Supine 90/90 Alternating Heel Touches with Posterior Pelvic Tilt  -  1 x daily - 7 x weekly - 2 sets - 10 reps - Supine Dead Bug with Leg Extension  - 1 x daily - 7 x weekly - 2 sets - 10 reps - Hip Flexion Stretch  - 1 x daily - 7 x weekly - 2 sets - 30 hold - Supine ITB Stretch with Strap  - 1 x daily - 7 x weekly - 2 sets - 30 hold  Patient Education - Trigger Point Dry Needling  Patient Education - Trigger Point Dry Needling ASSESSMENT:  CLINICAL IMPRESSION: Jama presented to therapy with increased right hip pain that aggravated his back pain as well. Hip and lumbar stretches were performed with a moist hot pack on his lumbar spine and Rt hip and he verbalized some relief from pain. Incorporated gravity assisted hip distraction with patient standing on a 2 inch step and he verbalized relief. Overall, patient tolerated treatment session well. Reminded patient to contact referring provider to discuss getting imaging done on his back/ Rt hip. Patient will benefit from skilled PT to address the below impairments and improve overall function.     OBJECTIVE IMPAIRMENTS: decreased knowledge of condition, difficulty walking, decreased ROM, decreased strength, increased fascial restrictions, increased muscle spasms, impaired  flexibility, postural dysfunction, obesity, and pain.   ACTIVITY LIMITATIONS: carrying, lifting, bending, sitting, standing, squatting, sleeping, stairs, transfers, bed mobility, bathing, and dressing  PARTICIPATION LIMITATIONS: driving, shopping, community activity, occupation, and yard work  PERSONAL FACTORS: Education, Financial risk analyst, Profession, and 1-2 comorbidities: obesity and cardiac issues recently are also affecting patient's functional outcome.   REHAB POTENTIAL: Good  CLINICAL DECISION MAKING: Stable/uncomplicated  EVALUATION COMPLEXITY: Low   GOALS: Goals reviewed with patient? Yes  SHORT TERM GOALS: Target date: 06/23/2023  Pain report to be no greater than 4/10  Baseline: Goal status: IN PROGRESS 07/21/2023  2.  Patient will be independent with initial HEP  Baseline:  Goal status: MET  3.  Patient to be able to keep working Baseline:  Goal status: MET    LONG TERM GOALS: Target date: 08/25/2023   Patient to report pain no greater than 2/10  Baseline:  Goal status: IN PROGRESS 07/21/2023  2.  Patient to be independent with advanced HEP  Baseline:  Goal status: IN PROGRESS 07/21/2023  3.  Patient to be able to bend, stoop and squat with pain no greater than 2/10 to be able to do his usual work Baseline:  Goal status: IN PROGRESS 07/21/2023  4.  Patient to be able to stand or walk for at least 30 min without need to rest due to back pain Baseline:  Goal status: MET 07/21/2023  5.  Patient to report 85% improvement in overall symptoms Baseline:  Goal status: IN PROGRESS 07/21/2023  6.  FOTO score to be 59 Baseline: 58 Goal status: IN PROGRESS 07/21/2023  PLAN:  PT FREQUENCY: 1-2x/week  PT DURATION: other: 5 weeks  PLANNED INTERVENTIONS: 97110-Therapeutic exercises, 97530- Therapeutic activity, 97112- Neuromuscular re-education, 97535- Self Care, 02859- Manual therapy, [id number]- Gait training, [phone]- Aquatic Therapy, 97014- Electrical stimulation  (unattended), [id number]- Ultrasound, 02987- Traction (mechanical), Patient/Family education, Taping, Dry Needling, Joint mobilization, Spinal mobilization, Cryotherapy, and Moist heat.  PLAN FOR NEXT SESSION: assess tolerance to treatment session; Continue to focus on R hip mobility, distraction/mobs as tolerated. Review body mechanics and sitting posture.  Continue LE flexibility, possibly add ball rolls, progressing core strengthening; manual/IASTM as indicated.  Re-visit traction if needed.    Delon B. Fields, PT 02/13/24 10:09 AM Shawnta Schlegel  Janit, PT 02/13/24 10:09 AM [facility] [address] [place], [place] [address] Phone # [phone] Fax [phone]

## 2023-08-14 ENCOUNTER — Telehealth: Payer: Self-pay

## 2023-08-14 NOTE — Telephone Encounter (Signed)
Copied from CRM [id number]. Topic: Clinical - Medical Advice >> Aug 14, 2023  2:49 PM Sim Boast F wrote: Reason for CRM: Patient spouse called request a call back regarding an order for patient to have imaging done per OUTPATIENT PHYSICAL THERAPY THORACOLUMBAR TREATMENT. If spouse does not answer please call patient and leave a voicemail. Not sure if patient should come in for appointment or if an order can be put in for scheduling.

## 2023-08-15 ENCOUNTER — Telehealth: Payer: Self-pay

## 2023-08-15 NOTE — Telephone Encounter (Signed)
 Patient states that the therapist put a note in his chart.  He states that therapy is not helping.  Note:  CLINICAL IMPRESSION: Shane [name] verbalized feeling 50% better since starting therapy. His low back pain levels have improved, however his right hip pain has not decreased. He is compliant with his HEP and he has tolerated core progressions well. He is able to do his work duties but he has to take rest breaks after 30 minutes. At times when he is standing or sitting stationary when he goes to move again he has increased pain. Educated patient to make an appointment with PCP to discuss imaging to determine if there is anything structural going on in his spine. Patient would continue to benefit from skilled therapy to address Rt hip pain. Patient responded well to manual techniques and verbalized some relief.

## 2023-08-15 NOTE — Telephone Encounter (Signed)
 Appointment scheduled for 08/17/23

## 2023-08-15 NOTE — Telephone Encounter (Signed)
Left message on machine for patient to return our call 

## 2023-08-15 NOTE — Telephone Encounter (Signed)
Copied from CRM [id number]. Topic: Clinical - Medical Advice >> Aug 15, 2023 10:11 AM Adaysia C wrote: Reason for CRM: Patient called to return call from [facility]), please follow up with patient #[phone]

## 2023-08-17 ENCOUNTER — Ambulatory Visit: Payer: BC Managed Care – PPO | Admitting: Internal Medicine

## 2023-08-17 ENCOUNTER — Encounter: Payer: Self-pay | Admitting: Internal Medicine

## 2023-08-17 ENCOUNTER — Ambulatory Visit: Payer: BC Managed Care – PPO

## 2023-08-17 VITALS — BP 120/80 | HR 59 | Temp 98.1°F | Wt 269.5 lb

## 2023-08-17 DIAGNOSIS — G8929 Other chronic pain: Secondary | ICD-10-CM | POA: Diagnosis not present

## 2023-08-17 DIAGNOSIS — M5441 Lumbago with sciatica, right side: Secondary | ICD-10-CM | POA: Diagnosis not present

## 2023-08-17 DIAGNOSIS — M48061 Spinal stenosis, lumbar region without neurogenic claudication: Secondary | ICD-10-CM | POA: Diagnosis not present

## 2023-08-17 DIAGNOSIS — M5136 Other intervertebral disc degeneration, lumbar region with discogenic back pain only: Secondary | ICD-10-CM | POA: Diagnosis not present

## 2023-08-17 DIAGNOSIS — M47816 Spondylosis without myelopathy or radiculopathy, lumbar region: Secondary | ICD-10-CM | POA: Diagnosis not present

## 2023-08-17 NOTE — Progress Notes (Signed)
 Established Patient Office Visit     CC/Reason for Visit: Requesting x-rays per physical therapy recommendations  HPI: Shane [name] is a 58 y.o. male who is coming in today for the above mentioned reasons.  He has been dealing with lower back and hip pain with right-sided sciatica.  He was referred to physical therapy.  Physical therapy has asked him to discuss with me x-rays.  Patient feels like the back pain has improved somewhat but still present.   Past Medical/Surgical History: Past Medical History:  Diagnosis Date   Chronic knee pain    GERD (gastroesophageal reflux disease)    Left shoulder pain    Obesity     Past Surgical History:  Procedure Laterality Date   carpal pummel repair Bilateral    CERVICAL DISC SURGERY  2003   per Dr. Alix    COLONOSCOPY  05/10/2018   per Dr. Shila, internal hemorrhoids and sessile serrated polyps, repeat in 3 yrs    KNEE SURGERY Right     Social History:  reports that he has never smoked. He has never used smokeless tobacco. He reports that he does not drink alcohol and does not use drugs.  Allergies: Allergies  Allergen Reactions   Penicillins Cross Reactors Rash    Family History:  Family History  Problem Relation Age of Onset   Heart disease Mother        mitrial valve repair, CABG    Prostate cancer Father    Hypertension Father    Colon cancer Father        in his early 24's   Esophageal cancer Neg Hx    Rectal cancer Neg Hx    Stomach cancer Neg Hx      Current Outpatient Medications:    amLODipine  (NORVASC ) 5 MG tablet, Take 1 tablet (5 mg total) by mouth daily., Disp: 90 tablet, Rfl: 3   aspirin  EC 81 MG tablet, Take 1 tablet (81 mg total) by mouth daily. Swallow whole., Disp: 30 tablet, Rfl: 3   atorvastatin  (LIPITOR) 80 MG tablet, Take 1 tablet (80 mg total) by mouth daily., Disp: 90 tablet, Rfl: 1   ciclopirox  (PENLAC ) 8 % solution, Apply topically at bedtime. Apply over nail and surrounding  skin. Apply daily over previous coat. After seven (7) days, may remove with alcohol and continue cycle., Disp: 6.6 mL, Rfl: 0   ciclopirox  (PENLAC ) 8 % solution, Apply topically at bedtime. Apply over nail and surrounding skin. Apply daily over previous coat. After seven (7) days, may remove with alcohol and continue cycle., Disp: 6.6 mL, Rfl: 1   cyclobenzaprine  (FLEXERIL ) 5 MG tablet, Take 1 tablet (5 mg total) by mouth at bedtime as needed for muscle spasms., Disp: 30 tablet, Rfl: 0   ketoconazole  (NIZORAL ) 2 % cream, Apply 1 Application topically daily., Disp: 60 g, Rfl: 1   ketoconazole  (NIZORAL ) 2 % cream, Apply 1 Application topically daily., Disp: 60 g, Rfl: 3   meloxicam  (MOBIC ) 7.5 MG tablet, Take 1 tablet (7.5 mg total) by mouth daily., Disp: 30 tablet, Rfl: 0   nitroGLYCERIN  (NITROSTAT ) 0.4 MG SL tablet, Place 1 tablet (0.4 mg total) under the tongue every 5 (five) minutes x 3 doses as needed for chest pain., Disp: 20 tablet, Rfl: 1   predniSONE  (STERAPRED UNI-PAK 21 TAB) 10 MG (21) TBPK tablet, Take as directed, Disp: 21 tablet, Rfl: 0   tolnaftate  (TINACTIN) 1 % powder, Apply 1 Application topically 2 (two) times daily., Disp:  45 g, Rfl: 0  Review of Systems:  Negative unless indicated in HPI.   Physical Exam: Vitals:   08/17/23 1431  BP: 120/80  Pulse: (!) 59  Temp: 98.1 F (36.7 C)  TempSrc: Oral  SpO2: 96%  Weight: 269 lb 8 oz (122.2 kg)    Body mass index is 38.67 kg/m.     Impression and Plan:  Chronic right-sided low back pain with right-sided sciatica -     DG Lumbar Spine Complete; Future     Time spent:21 minutes reviewing chart, interviewing and examining patient and formulating plan of care.     [name], MD [facility]

## 2023-08-18 ENCOUNTER — Encounter: Payer: BC Managed Care – PPO | Admitting: Physical Therapy

## 2023-08-25 ENCOUNTER — Encounter: Payer: BC Managed Care – PPO | Admitting: Physical Therapy

## 2023-09-08 ENCOUNTER — Ambulatory Visit: Payer: BC Managed Care – PPO | Admitting: Podiatry

## 2023-09-29 ENCOUNTER — Other Ambulatory Visit (INDEPENDENT_AMBULATORY_CARE_PROVIDER_SITE_OTHER): Payer: BC Managed Care – PPO

## 2023-09-29 DIAGNOSIS — E785 Hyperlipidemia, unspecified: Secondary | ICD-10-CM

## 2023-09-29 LAB — LIPID PANEL
Cholesterol: 158 mg/dL (ref 0–200)
HDL: 42.1 mg/dL (ref >39.00)
LDL Cholesterol: 108 mg/dL — ABNORMAL HIGH (ref 0–99)
NonHDL: 115.96
Total CHOL/HDL Ratio: 4
Triglycerides: 42 mg/dL (ref 0.0–149.0)
VLDL: 8.4 mg/dL (ref 0.0–40.0)

## 2023-10-02 ENCOUNTER — Encounter: Payer: Self-pay | Admitting: Internal Medicine

## 2023-10-09 ENCOUNTER — Other Ambulatory Visit: Payer: Self-pay | Admitting: *Deleted

## 2023-10-09 DIAGNOSIS — E782 Mixed hyperlipidemia: Secondary | ICD-10-CM

## 2023-10-18 ENCOUNTER — Encounter: Payer: Self-pay | Admitting: Internal Medicine

## 2023-10-18 ENCOUNTER — Ambulatory Visit: Admitting: Internal Medicine

## 2023-10-18 VITALS — BP 138/87 | HR 68 | Temp 97.7°F | Wt 265.2 lb

## 2023-10-18 DIAGNOSIS — M5441 Lumbago with sciatica, right side: Secondary | ICD-10-CM | POA: Diagnosis not present

## 2023-10-18 DIAGNOSIS — G8929 Other chronic pain: Secondary | ICD-10-CM | POA: Diagnosis not present

## 2023-10-18 DIAGNOSIS — M545 Low back pain, unspecified: Secondary | ICD-10-CM

## 2023-10-18 MED ORDER — KETOROLAC TROMETHAMINE 60 MG/2ML IM SOLN
60.0000 mg | Freq: Once | INTRAMUSCULAR | Status: AC
Start: 2023-10-18 — End: 2023-10-18
  Administered 2023-10-18: 60 mg via INTRAMUSCULAR

## 2023-10-18 MED ORDER — MELOXICAM 7.5 MG PO TABS: 7.5000 mg | ORAL_TABLET | Freq: Every day | ORAL | 0 refills | Status: AC

## 2023-10-18 NOTE — Progress Notes (Signed)
 Established Patient Office Visit     CC/Reason for Visit: Flareup of chronic back pain  HPI: Shane [name] is a 57 y.o. male who is coming in today for the above mentioned reasons.  Has been dealing with chronic low back pain with right-sided sciatica for some time.  Has been attending physical therapy.  Had lumbar spine x-rays that showed multilevel degenerative disc disease.  He is currently having a bad flareup with pain radiating down his buttock and posterior lateral right thigh   Past Medical/Surgical History: Past Medical History:  Diagnosis Date   Chronic knee pain    GERD (gastroesophageal reflux disease)    Left shoulder pain    Obesity     Past Surgical History:  Procedure Laterality Date   carpal pummel repair Bilateral    CERVICAL DISC SURGERY  2003   per Dr. Newell Coral    COLONOSCOPY  05/10/2018   per Dr. Lavon Paganini, internal hemorrhoids and sessile serrated polyps, repeat in 3 yrs    KNEE SURGERY Right     Social History:  reports that he has never smoked. He has never used smokeless tobacco. He reports that he does not drink alcohol and does not use drugs.  Allergies: Allergies  Allergen Reactions   Penicillins Cross Reactors Rash    Family History:  Family History  Problem Relation Age of Onset   Heart disease Mother        mitrial valve repair, CABG    Prostate cancer Father    Hypertension Father    Colon cancer Father        in his early 49's   Esophageal cancer Neg Hx    Rectal cancer Neg Hx    Stomach cancer Neg Hx      Current Outpatient Medications:    amLODipine (NORVASC) 5 MG tablet, Take 1 tablet (5 mg total) by mouth daily., Disp: 90 tablet, Rfl: 3   aspirin EC 81 MG tablet, Take 1 tablet (81 mg total) by mouth daily. Swallow whole., Disp: 30 tablet, Rfl: 3   atorvastatin (LIPITOR) 80 MG tablet, Take 1 tablet (80 mg total) by mouth daily., Disp: 90 tablet, Rfl: 1   ciclopirox (PENLAC) 8 % solution, Apply topically at  bedtime. Apply over nail and surrounding skin. Apply daily over previous coat. After seven (7) days, may remove with alcohol and continue cycle., Disp: 6.6 mL, Rfl: 0   ciclopirox (PENLAC) 8 % solution, Apply topically at bedtime. Apply over nail and surrounding skin. Apply daily over previous coat. After seven (7) days, may remove with alcohol and continue cycle., Disp: 6.6 mL, Rfl: 1   cyclobenzaprine (FLEXERIL) 5 MG tablet, Take 1 tablet (5 mg total) by mouth at bedtime as needed for muscle spasms., Disp: 30 tablet, Rfl: 0   ketoconazole (NIZORAL) 2 % cream, Apply 1 Application topically daily., Disp: 60 g, Rfl: 1   ketoconazole (NIZORAL) 2 % cream, Apply 1 Application topically daily., Disp: 60 g, Rfl: 3   nitroGLYCERIN (NITROSTAT) 0.4 MG SL tablet, Place 1 tablet (0.4 mg total) under the tongue every 5 (five) minutes x 3 doses as needed for chest pain., Disp: 20 tablet, Rfl: 1   predniSONE (STERAPRED UNI-PAK 21 TAB) 10 MG (21) TBPK tablet, Take as directed, Disp: 21 tablet, Rfl: 0   tolnaftate (TINACTIN) 1 % powder, Apply 1 Application topically 2 (two) times daily., Disp: 45 g, Rfl: 0   meloxicam (MOBIC) 7.5 MG tablet, Take 1 tablet (7.5 mg  total) by mouth daily., Disp: 30 tablet, Rfl: 0  Current Facility-Administered Medications:    ketorolac (TORADOL) injection 60 mg, 60 mg, Intramuscular, Once,   Review of Systems:  Negative unless indicated in HPI.   Physical Exam: Vitals:   10/18/23 0849 10/18/23 0853  BP: (!) 150/90 138/87  Pulse: 68   Temp: 97.7 F (36.5 C)   TempSrc: Oral   SpO2: 92%   Weight: 265 lb 3.2 oz (120.3 kg)     Body mass index is 38.05 kg/m.     Impression and Plan:  Chronic right-sided low back pain with right-sided sciatica -     Ketorolac Tromethamine -     Meloxicam; Take 1 tablet (7.5 mg total) by mouth daily.  Dispense: 30 tablet; Refill: 0 -     Ambulatory referral to Orthopedic Surgery  Acute right-sided low back pain without  sciatica   -IM Toradol in office today, I have renewed his meloxicam prescription.  I will place referral to orthopedics for further evaluation.  Time spent:30 minutes reviewing chart, interviewing and examining patient and formulating plan of care.     Chaya Jan, MD [facility]

## 2023-10-19 ENCOUNTER — Telehealth: Payer: Self-pay | Admitting: *Deleted

## 2023-10-19 NOTE — Telephone Encounter (Signed)
 Letter available in MyChart and the patient is aware.

## 2023-10-19 NOTE — Telephone Encounter (Signed)
 Patient is requesting a note from 10/16/23 -10/20/23.  Okay to write the note?

## 2023-10-19 NOTE — Telephone Encounter (Signed)
 Copied from CRM [id number]. Topic: General - Other >> Oct 19, 2023  2:43 PM Shane [name] P wrote: Reason for CRM: Patient requesting Dr note for work on [employer] or if he need to pick it up he will. 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

## 2023-10-20 ENCOUNTER — Telehealth: Payer: Self-pay | Admitting: Internal Medicine

## 2023-10-20 NOTE — Telephone Encounter (Signed)
 Copied from CRM [id number]. Topic: General - Other >> Oct 19, 2023  4:41 PM Victorino Dike T wrote: Reason for CRM: Received work note but it does not say he is clear to go back to work with no restrictions- please correct so he is able to return to work

## 2023-10-23 ENCOUNTER — Encounter: Payer: Self-pay | Admitting: Family Medicine

## 2023-10-23 ENCOUNTER — Ambulatory Visit (INDEPENDENT_AMBULATORY_CARE_PROVIDER_SITE_OTHER): Admitting: Family Medicine

## 2023-10-23 VITALS — BP 136/80 | HR 78 | Temp 99.0°F | Ht 70.0 in | Wt 271.0 lb

## 2023-10-23 DIAGNOSIS — M25551 Pain in right hip: Secondary | ICD-10-CM | POA: Diagnosis not present

## 2023-10-23 DIAGNOSIS — M5441 Lumbago with sciatica, right side: Secondary | ICD-10-CM | POA: Diagnosis not present

## 2023-10-23 DIAGNOSIS — R1900 Intra-abdominal and pelvic swelling, mass and lump, unspecified site: Secondary | ICD-10-CM

## 2023-10-23 MED ORDER — PREDNISONE 10 MG PO TABS: ORAL_TABLET | ORAL | 0 refills | Status: AC

## 2023-10-23 NOTE — Telephone Encounter (Signed)
 Letter given to patient.

## 2023-10-23 NOTE — Progress Notes (Signed)
 Established Patient Office Visit   Subjective  Patient ID: Shane [name], male    DOB: 01-03-1966  Age: 58 y.o. MRN: [id number]  Chief Complaint  Patient presents with   Back Pain    Patient is a 58 year old male followed by Dr. Ival Marines and seen for ongoing concern.  Patient saw PCP on 10/18/23 for acute on chronic low back pain with right-sided sciatica.  Given Mobic without relief in symptoms.  An Ortho referral was placed.  Was in PT.  Pt endorses continued midline low back pain with radiation down the right posterior leg.  Right leg feels weak especially with going up stairs.  Endorses sharp intermittent pain in right hip at times.  Mentions bilateral groin edema, no pain.  Currently pain 5/10, but patient has a high tolerance for pain.  Taking Flexeril and Mobic at night d/t drowsiness.  Pt states he is unable to go back to work on light duty.  States the note will have to say full duty without restrictions in order for him to return.  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  States they have off Friday and Monday for the Easter holiday.    Patient Active Problem List   Diagnosis Date Noted   Sepsis (HCC) 02/16/2020   Cellulitis of left lower extremity 02/16/2020   Vitamin D deficiency 02/04/2020   Hyperlipidemia 02/04/2020   Essential hypertension 10/07/2013   Chest pain of uncertain etiology 09/17/2013   Palpitations 09/17/2013   Acute diarrhea 01/17/2012   Acute gastroenteritis 01/17/2012   Mild dehydration 01/17/2012   Obesity 07/24/2008   GERD 07/24/2008   HEMATURIA, MICROSCOPIC, HX OF 07/24/2008   Past Medical History:  Diagnosis Date   Chronic knee pain    GERD (gastroesophageal reflux disease)    Left shoulder pain    Obesity    Past Surgical History:  Procedure Laterality Date   carpal pummel repair Bilateral    CERVICAL DISC SURGERY  2003   per Dr.  Cipriano Creeks    COLONOSCOPY  05/10/2018   per Dr. Leonia Raman, internal hemorrhoids and sessile serrated polyps, repeat in 3 yrs    KNEE SURGERY Right    Social History   Tobacco Use   Smoking status: Never   Smokeless tobacco: Never  Vaping Use   Vaping status: Never Used  Substance Use Topics   Alcohol use: No    Alcohol/week: 0.0 standard drinks of alcohol   Drug use: No   Family History  Problem Relation Age of Onset   Heart disease Mother        mitrial valve repair, CABG    Prostate cancer Father    Hypertension Father    Colon cancer Father        in his early 73's   Esophageal cancer Neg Hx    Rectal cancer Neg Hx    Stomach cancer Neg Hx    Allergies  Allergen Reactions   Penicillins Cross Reactors Rash      ROS Negative unless stated above    Objective:     BP 136/80 (BP Location: Left Arm, Patient Position: Sitting, Cuff Size: Large)   Pulse 78   Temp 99 F (37.2 C) (Oral)   Ht 5\' 10"  (1.778 m)   Wt 271 lb (122.9 kg)   SpO2 96%   BMI 38.88 kg/m  BP Readings from Last 3 Encounters:  10/23/23 136/80  10/18/23 138/87  08/17/23 120/80   Wt Readings from Last 3 Encounters:  10/23/23 271 lb (122.9 kg)  10/18/23 265 lb 3.2 oz (120.3 kg)  08/17/23 269 lb 8 oz (122.2 kg)    Physical Exam Constitutional:      General: He is not in acute distress.    Appearance: Normal appearance.  HENT:     Head: Normocephalic and atraumatic.     Nose: Nose normal.     Mouth/Throat:     Mouth: Mucous membranes are moist.  Cardiovascular:     Rate and Rhythm: Normal rate and regular rhythm.     Heart sounds: Normal heart sounds. No murmur heard.    No gallop.  Pulmonary:     Effort: Pulmonary effort is normal. No respiratory distress.     Breath sounds: Normal breath sounds. No wheezing, rhonchi or rales.  Musculoskeletal:     Lumbar back: Tenderness and bony tenderness present.       Back:  Skin:    General: Skin is warm and dry.  Neurological:     Mental  Status: He is alert and oriented to person, place, and time.    No results found for any visits on 10/23/23.    Assessment & Plan:  Acute bilateral low back pain with right-sided sciatica -     predniSONE; Take 4 tabs every morning for 3 days, 3 tabs for 2 days, 2 tabs for 2 days, 1 tab for 1 day.  Dispense: 23 tablet; Refill: 0  Groin fullness  Right hip pain  Patient with acute on chronic bilateral low back pain with right-sided sciatica.  Start prednisone taper.  Continue Mobic and Flexeril nightly as needed.  Discussed other supportive care and stretching.  Ortho referral made by PCP.  For worsening symptoms consider Ortho walk-in clinic.  Given note for work taking him out this week with return next Tuesday as he has off Friday and Monday for the Easter holiday.  Follow-up with PCP  Return if symptoms worsen or fail to improve.   Viola Greulich, MD

## 2023-11-03 ENCOUNTER — Other Ambulatory Visit (INDEPENDENT_AMBULATORY_CARE_PROVIDER_SITE_OTHER): Payer: Self-pay

## 2023-11-03 ENCOUNTER — Ambulatory Visit: Admitting: Orthopedic Surgery

## 2023-11-03 VITALS — BP 142/80 | HR 66 | Ht 70.0 in | Wt 271.0 lb

## 2023-11-03 DIAGNOSIS — M545 Low back pain, unspecified: Secondary | ICD-10-CM

## 2023-11-03 NOTE — Progress Notes (Addendum)
 Orthopedic Spine Surgery Office Note  Assessment: Patient is a 58 y.o. male with right posterior hip pain.  Has some examiners consistent with hip as etiology   Plan: -Explained that initially conservative treatment is tried as a significant number of patients may experience relief with these treatment modalities. Discussed that the conservative treatments include:  -activity modification  -physical therapy  -over the counter pain medications  -medrol  dosepak  -lumbar steroid injections -Patient has tried Tylenol , ibuprofen, prednisone , PT  -Recommended diagnostic/therapeutic right hip injection.  I told him to pay attention to the first couple days.  If he notices significant relief that suggest that the hip is etiology -If he does not get any relief with the injection, would workup his lumbar spine further with an MRI -Can use up to 1000mg  of tylenol  TID -Patient should return to office in 4 weeks, x-rays at next visit: None   Patient expressed understanding of the plan and all questions were answered to the patient's satisfaction.   ___________________________________________________________________________   History:  Patient is a 58 y.o. male who presents today for lumbar spine.  Patient has had on and off back pain for several years.  Is gotten progressively worse recently.  He says over the last 3 to 4 months it has become more consistent in nature.  He has the pain in his right posterior hip and buttock region.  Sometimes extends up into the lower lumbar region as well on the right side.  No pain radiating past the buttock.  No pain in the left lower extremity.  There is no trauma or injury that preceded the onset of pain.  He notices the pain mostly with weightbearing but sometimes will get it even with rest.   Weakness: Denies Symptoms of imbalance: Denies Paresthesias and numbness: Denies Bowel or bladder incontinence: Denies Saddle anesthesia: Denies  Treatments tried:  Tylenol , ibuprofen, prednisone , PT  Review of systems: Denies fevers and chills, night sweats, unexplained weight loss, history of cancer, pain that wakes him at night  Past medical history: GERD  Allergies: penicillin  Past surgical history:  Bilateral carpal tunnel release Cervical disc replacement Right knee meniscus repair  Social history: Denies use of nicotine product (smoking, vaping, patches, smokeless) Alcohol use: Denies Denies recreational drug use   Physical Exam:  BMI of 38.9  General: no acute distress, appears stated age Neurologic: alert, answering questions appropriately, following commands Respiratory: unlabored breathing on room air, symmetric chest rise Psychiatric: appropriate affect, normal cadence to speech   MSK (spine):  -Strength exam      Left  Right EHL    5/5  5/5 TA    5/5  5/5 GSC    5/5  5/5 Knee extension  5/5  5/5 Hip flexion   5/5  5/5  -Sensory exam    Sensation intact to light touch in L3-S1 nerve distributions of bilateral lower extremities  -Achilles DTR: 2/4 on the left, 2/4 on the right -Patellar tendon DTR: 2/4 on the left, 2/4 on the right  -Straight leg raise: Negative bilaterally -Femoral nerve stretch test: Negative bilaterally -Clonus: no beats bilaterally  -Left hip exam: No pain through range of motion, negative Stinchfield, negative FABER -Right hip exam: Positive FADIR, positive Stinchfield, negative FABER, negative SI joint compression test  Imaging: XRs of the lumbar spine from 11/03/2023 was independently reviewed and interpreted, showing no significant degenerative changes.  No fracture or dislocation seen.  No evidence of instability on flexion/extension views.    Patient name:  Shane [name] Patient MRN: [id number] Date of visit: 11/03/23

## 2023-11-17 ENCOUNTER — Other Ambulatory Visit: Payer: Self-pay

## 2023-11-17 ENCOUNTER — Encounter: Payer: Self-pay | Admitting: Sports Medicine

## 2023-11-17 ENCOUNTER — Ambulatory Visit (INDEPENDENT_AMBULATORY_CARE_PROVIDER_SITE_OTHER): Admitting: Sports Medicine

## 2023-11-17 DIAGNOSIS — M25551 Pain in right hip: Secondary | ICD-10-CM

## 2023-11-17 DIAGNOSIS — G8929 Other chronic pain: Secondary | ICD-10-CM

## 2023-11-17 DIAGNOSIS — M5136 Other intervertebral disc degeneration, lumbar region with discogenic back pain only: Secondary | ICD-10-CM

## 2023-11-17 MED ORDER — METHYLPREDNISOLONE ACETATE 40 MG/ML IJ SUSP
80.0000 mg | INTRAMUSCULAR | Status: AC | PRN
Start: 2023-11-17 — End: 2023-11-17
  Administered 2023-11-17: 80 mg via INTRA_ARTICULAR

## 2023-11-17 MED ORDER — LIDOCAINE HCL 1 % IJ SOLN
4.0000 mL | INTRAMUSCULAR | Status: AC | PRN
  Administered 2023-11-17: 4 mL

## 2023-11-17 NOTE — Progress Notes (Signed)
 Shane [name] - 58 y.o. male MRN [id number]  Date of birth: Oct 28, 1965  Office Visit Note: Visit Date: 11/17/2023 PCP: Zilphia Hilt, Charyl Coppersmith, MD Referred by: Zilphia Hilt, Estel*  Subjective: Chief Complaint  Patient presents with   Right Hip - Pain   HPI: Shane [name] is a pleasant 58 y.o. male who presents today for right hip pain in setting of lumbar spine pain.  Shane [name] has been having on and off back pain for the last several years.  However, over the last 4 months or so this has become more consistent and he is pointing to pain more so in the posterior hip and in the buttock.  Very infrequently with his radiate into the groin.  He has seen my partner Dr. Sulema Endo who thought this potentially could be coming from the back or the hip.  I did review his lumbar x-rays which do show his hip joints rather well, see imaging below.  He uses Tylenol  or ibuprofen only as needed.  Pertinent ROS were reviewed with the patient and found to be negative unless otherwise specified above in HPI.   Assessment & Plan: Visit Diagnoses:  1. Chronic right hip pain   2. Degeneration of intervertebral disc of lumbar region with discogenic back pain    Plan: Impression is chronic right posterior hip and buttock pain in the setting of lumbar DDD.  He does have provocative symptoms emanating from within the hip joint does have some mild arthritic change on his x-rays.  We discussed performing an ultrasound-guided right intra-articular hip injection for both diagnostic and hopefully therapeutic purposes.  We did proceed with this today, patient tolerated well.  Advised on postinjection protocol.  He will evaluate over the next 1-2 weeks both the degree/percentage of improvement and where this improves his pain.  This will help decipher between hip origin versus lumbar origin.  He does have a follow-up appointment with Dr. Sulema Endo in about 2-3 weeks.  I am happy to see him back as needed.  Okay for  ice/heat or Tylenol  and ibuprofen for any postinjection pain.  Follow-up: Return for already has f/u with Dr. Sulema Endo  .   Meds & Orders: No orders of the defined types were placed in this encounter.   Orders Placed This Encounter  Procedures   Large Joint Inj   US  Guided Needle Placement - No Linked Charges     Procedures: Large Joint Inj: R hip joint on 11/17/2023 10:53 AM Indications: pain and diagnostic evaluation Details: 22 G 3.5 in needle, ultrasound-guided anterior approach Medications: 4 mL lidocaine  1 %; 80 mg methylPREDNISolone  acetate 40 MG/ML Outcome: tolerated well, no immediate complications  Procedure: US -guided intra-articular hip injection, Right After discussion on risks/benefits/indications and informed verbal consent was obtained, a timeout was performed. Patient was lying supine on exam table. The hip was cleaned with betadine and alcohol swabs. Then utilizing ultrasound guidance, the patient's femoral head and neck junction was identified and subsequently injected with 4:2 lidocaine :depomedrol via an in-plane approach with ultrasound visualization of the injectate administered into the hip joint. Patient tolerated procedure well without immediate complications.  Procedure, treatment alternatives, risks and benefits explained, specific risks discussed. Consent was given by the patient. Immediately prior to procedure a time out was called to verify the correct patient, procedure, equipment, support staff and site/side marked as required. Patient was prepped and draped in the usual sterile fashion.          Clinical History: No specialty comments  available.  He reports that he has never smoked. He has never used smokeless tobacco.  Recent Labs    02/06/23 2152  HGBA1C 5.4    Objective:    Physical Exam  Gen: Well-appearing, in no acute distress; non-toxic CV: Well-perfused. Warm.  Resp: Breathing unlabored on room air; no wheezing. Psych: Fluid speech in  conversation; appropriate affect; normal thought process  Ortho Exam - Right hip: No greater trochanter TTP.  There is about 5 degrees less of internal rotation with logroll on the right compared to the left which moves fluidly.  Positive Stinchfield, positive FADIR test.  Negative FABER test.  Imaging:  *I did review the lumbar spine x-rays from 08/17/2023 which attention to the hip joints.  There is mild arthritic change in the right greater than left hip.  There is a small overhanging rim on the right acetabulum.  No acute fracture.  Femoral head is well-seated within the acetabulum.  Narrative & Impression  CLINICAL DATA:  Chronic right-sided lower back pain.   EXAM: LUMBAR SPINE - COMPLETE 4+ VIEW   COMPARISON:  None Available.   FINDINGS: There is no evidence of lumbar spine fracture. Alignment is normal. Mild narrowing of the L1-2 disc space posteriorly. Degenerative endplate osteophytes most pronounced at L2-3 and L3-4. Facet arthrosis throughout the lumbar spine most pronounced at L4-5 and L5-S1.   IMPRESSION: 1. No evidence of lumbar spine fracture or malalignment. 2. Multilevel degenerative disc and facet disease.     Electronically Signed   By: Denny Flack M.D.   On: 08/31/2023 09:44    Past Medical/Family/Surgical/Social History: Medications & Allergies reviewed per EMR, new medications updated. Patient Active Problem List   Diagnosis Date Noted   Sepsis (HCC) 02/16/2020   Cellulitis of left lower extremity 02/16/2020   Vitamin D  deficiency 02/04/2020   Hyperlipidemia 02/04/2020   Essential hypertension 10/07/2013   Chest pain of uncertain etiology 09/17/2013   Palpitations 09/17/2013   Acute diarrhea 01/17/2012   Acute gastroenteritis 01/17/2012   Mild dehydration 01/17/2012   Obesity 07/24/2008   GERD 07/24/2008   HEMATURIA, MICROSCOPIC, HX OF 07/24/2008   Past Medical History:  Diagnosis Date   Chronic knee pain    GERD (gastroesophageal reflux  disease)    Left shoulder pain    Obesity    Family History  Problem Relation Age of Onset   Heart disease Mother        mitrial valve repair, CABG    Prostate cancer Father    Hypertension Father    Colon cancer Father        in his early 63's   Esophageal cancer Neg Hx    Rectal cancer Neg Hx    Stomach cancer Neg Hx    Past Surgical History:  Procedure Laterality Date   carpal pummel repair Bilateral    CERVICAL DISC SURGERY  2003   per Dr. Cipriano Creeks    COLONOSCOPY  05/10/2018   per Dr. Leonia Raman, internal hemorrhoids and sessile serrated polyps, repeat in 3 yrs    KNEE SURGERY Right    Social History   Occupational History   Occupation: [employer]   Tobacco Use   Smoking status: Never   Smokeless tobacco: Never  Vaping Use   Vaping status: Never Used  Substance and Sexual Activity   Alcohol use: No    Alcohol/week: 0.0 standard drinks of alcohol   Drug use: No   Sexual activity: Not on file

## 2023-11-30 ENCOUNTER — Ambulatory Visit: Admitting: Sports Medicine

## 2023-12-07 ENCOUNTER — Ambulatory Visit: Admitting: Orthopedic Surgery

## 2023-12-07 VITALS — BP 139/80 | HR 77

## 2023-12-07 DIAGNOSIS — M545 Low back pain, unspecified: Secondary | ICD-10-CM

## 2023-12-07 NOTE — Progress Notes (Signed)
 Orthopedic Spine Surgery Office Note   Assessment: Patient is a 58 y.o. male with low back and right posterior hip pain.  Did not respond to a intra-articular hip injection     Plan: -Patient has tried Tylenol , ibuprofen, prednisone , PT  -Patient has not had symptoms for about 5 months and they have persisted in spite of conservative treatment, so recommended an MRI of the lumbar spine to evaluate further -Patient should return to office in 4 weeks, x-rays at next visit: none     Patient expressed understanding of the plan and all questions were answered to the patient's satisfaction.    ___________________________________________________________________________     History:   Patient is a 58 y.o. male who presents today for follow-up on his lumbar spine.  After last visit, patient got an injection with Dr. Vaughn Georges to his right hip.  He said it not provide him with any significant relief.  He actually notices back pain was getting worse after that.  He feels the pain in his low back and in the right posterior hip and buttock region.  He does not have any pain radiating past the buttock.  No pain on the left side.  He has not noticed any significant changes besides the worsening back pain since he was last seen in the office.     Physical Exam:   General: no acute distress, appears stated age Neurologic: alert, answering questions appropriately, following commands Respiratory: unlabored breathing on room air, symmetric chest rise Psychiatric: appropriate affect, normal cadence to speech     MSK (spine):   -Strength exam                                                   Left                  Right EHL                              5/5                  5/5 TA                                 5/5                  5/5 GSC                             5/5                  5/5 Knee extension            5/5                  5/5 Hip flexion                    5/5                  5/5    -Sensory exam                           Sensation intact to light touch in  L3-S1 nerve distributions of bilateral lower extremities   Imaging: XRs of the lumbar spine from 11/03/2023 was previously independently reviewed and interpreted, showing no significant degenerative changes.  No fracture or dislocation seen.  No evidence of instability on flexion/extension views.     Patient name: LYNK MARTI Patient MRN: [id number] Date of visit: 12/07/23

## 2023-12-12 ENCOUNTER — Encounter: Payer: Self-pay | Admitting: Orthopedic Surgery

## 2023-12-22 ENCOUNTER — Ambulatory Visit
Admission: RE | Admit: 2023-12-22 | Discharge: 2023-12-22 | Disposition: A | Discharge status: Home / Self Care | Source: Ambulatory Visit | Attending: Orthopedic Surgery | Admitting: Orthopedic Surgery

## 2023-12-22 DIAGNOSIS — M48061 Spinal stenosis, lumbar region without neurogenic claudication: Secondary | ICD-10-CM | POA: Diagnosis not present

## 2023-12-22 DIAGNOSIS — M47816 Spondylosis without myelopathy or radiculopathy, lumbar region: Secondary | ICD-10-CM | POA: Diagnosis not present

## 2023-12-22 DIAGNOSIS — M545 Low back pain, unspecified: Secondary | ICD-10-CM

## 2024-01-04 ENCOUNTER — Ambulatory Visit: Admitting: Orthopedic Surgery

## 2024-01-04 DIAGNOSIS — M5416 Radiculopathy, lumbar region: Secondary | ICD-10-CM | POA: Diagnosis not present

## 2024-01-04 NOTE — Progress Notes (Signed)
 Orthopedic Spine Surgery Office Note   Assessment: Patient is a 58 y.o. male with low back and right posterior hip pain.  He has a small far lateral disc herniation on the right at L4/5     Plan: -Patient has tried Tylenol , ibuprofen, prednisone , PT, hip injection - Recommended a diagnostic/therapeutic L4/5 transforaminal injection.  Explained that if this gives him good relief and lasting relief, we can continue with this as his treatment.  If it provides him with good but not lasting relief, we discussed surgery as a next treatment option.  That does not give him any relief whatsoever, then we will have to reevaluate the etiology of his pain -Patient should return to office in 4 weeks, x-rays at next visit: none     Patient expressed understanding of the plan and all questions were answered to the patient's satisfaction.    ___________________________________________________________________________     History:   Patient is a 58 y.o. male who presents today for follow-up on his lumbar spine.  Patient continues to have low back and right posterior hip pain.  He also feels the pain in the right buttock.  No pain in the left lower extremity.  No pain radiating past the buttock on the right side.  He has not developed any new symptoms since he was last seen.  He feels the pain with activity and at rest.  He feels it on a daily basis.    Physical Exam:   General: no acute distress, appears stated age Neurologic: alert, answering questions appropriately, following commands Respiratory: unlabored breathing on room air, symmetric chest rise Psychiatric: appropriate affect, normal cadence to speech     MSK (spine):   -Strength exam                                                   Left                  Right EHL                              5/5                  5/5 TA                                 5/5                  5/5 GSC                             5/5                  5/5 Knee  extension            5/5                  5/5 Hip flexion                    5/5                  5/5   -Sensory exam  Sensation intact to light touch in L3-S1 nerve distributions of bilateral lower extremities   Imaging: XRs of the lumbar spine from 11/03/2023 was previously independently reviewed and interpreted, showing no significant degenerative changes.  No fracture or dislocation seen.  No evidence of instability on flexion/extension views.  MRI of the lumbar spine from 12/22/2023 was independent reviewed and interpreted, showing small far lateral disc herniation at L4/5 on the right.  No other significant neural compression seen.  No significant DDD.     Patient name: Shane [name] Patient MRN: [id number] Date of visit: 01/04/24

## 2024-01-18 NOTE — Discharge Instructions (Signed)

## 2024-01-19 ENCOUNTER — Ambulatory Visit
Admission: RE | Admit: 2024-01-19 | Discharge: 2024-01-19 | Disposition: A | Discharge status: Home / Self Care | Source: Ambulatory Visit | Attending: Orthopedic Surgery

## 2024-01-19 DIAGNOSIS — M5416 Radiculopathy, lumbar region: Secondary | ICD-10-CM

## 2024-01-19 DIAGNOSIS — M4727 Other spondylosis with radiculopathy, lumbosacral region: Secondary | ICD-10-CM | POA: Diagnosis not present

## 2024-01-19 MED ORDER — IOPAMIDOL (ISOVUE-M 200) INJECTION 41%
1.0000 mL | Freq: Once | INTRAMUSCULAR | Status: AC
  Administered 2024-01-19: 1 mL via EPIDURAL

## 2024-01-19 MED ORDER — METHYLPREDNISOLONE ACETATE 40 MG/ML INJ SUSP (RADIOLOG
80.0000 mg | Freq: Once | INTRAMUSCULAR | Status: AC
Start: 2024-01-19 — End: 2024-01-19
  Administered 2024-01-19: 80 mg via EPIDURAL

## 2024-02-05 ENCOUNTER — Ambulatory Visit (INDEPENDENT_AMBULATORY_CARE_PROVIDER_SITE_OTHER): Admitting: Orthopedic Surgery

## 2024-02-05 DIAGNOSIS — M5416 Radiculopathy, lumbar region: Secondary | ICD-10-CM

## 2024-02-05 NOTE — Progress Notes (Signed)
 Orthopedic Spine Surgery Office Note   Assessment: Patient is a 58 y.o. male with low back and right posterior hip pain.  He has a small far lateral disc herniation on the right at L4/5     Plan: -Patient has tried Tylenol , ibuprofen, prednisone , PT, hip injection -Patient did well for several days with the injection and had noted significant proved in his pain which is diagnostic.  However, the relief did not last so would not repeat this in the future -Discussed surgical decompression with a facetectomy and subsequent stabilization with a TLIF and PSIF.  Explained that there are risk for complications with surgery and risk for adjacent segment disease.  I told him that if his pain is currently tolerable that I would not recommend surgery -Patient is going to think about his options and will let me know if he would like to proceed with surgery -Patient can follow-up on an as-needed basis     Patient expressed understanding of the plan and all questions were answered to the patient's satisfaction.    ___________________________________________________________________________     History:   Patient is a 58 y.o. male who presents today for follow-up on his lumbar spine.  Patient continues to have low back and right posterior hip pain.  Pain is in the right buttock.  He does not have any pain radiating further.  He has no pain in the left lower extremity.  After last visit, he got an injection which provided him with significant relief but it only lasted for 3 to 4 days.  Then pain gradually returned to its original state.  He has not noticed any new symptoms since he was last seen in the office.     Physical Exam:   General: no acute distress, appears stated age Neurologic: alert, answering questions appropriately, following commands Respiratory: unlabored breathing on room air, symmetric chest rise Psychiatric: appropriate affect, normal cadence to speech     MSK (spine):   -Strength  exam                                                   Left                  Right EHL                              5/5                  5/5 TA                                 5/5                  5/5 GSC                             5/5                  5/5 Knee extension            5/5                  5/5 Hip flexion  5/5                  5/5   -Sensory exam                           Sensation intact to light touch in L3-S1 nerve distributions of bilateral lower extremities   Imaging: XRs of the lumbar spine from 11/03/2023 was previously independently reviewed and interpreted, showing no significant degenerative changes.  No fracture or dislocation seen.  No evidence of instability on flexion/extension views.   MRI of the lumbar spine from 12/22/2023 was previously independent reviewed and interpreted, showing small far lateral disc herniation at L4/5 on the right.  No other significant neural compression seen.  No significant DDD.     Patient name: Shane [name] Patient MRN: [id number] Date of visit: 02/05/24

## 2024-02-08 ENCOUNTER — Encounter: Payer: Self-pay | Admitting: Orthopedic Surgery

## 2024-02-12 ENCOUNTER — Ambulatory Visit: Admitting: Orthopedic Surgery

## 2024-02-12 DIAGNOSIS — M5416 Radiculopathy, lumbar region: Secondary | ICD-10-CM

## 2024-02-12 NOTE — Progress Notes (Signed)
 Orthopedic Spine Surgery Office Note   Assessment: Patient is a 58 y.o. male with low back and right posterior hip pain.  He has a far lateral disc herniation on the right at L4/5     Plan: -Patient has tried Tylenol , ibuprofen, prednisone , PT, hip injection, lumbar injection -Patient thought about it at home and cannot live with this amount of pain, so wanted to discuss surgical management.  He has now had this pain for over 3 months in spite of conservative treatment.  Since his diagnostic injection pointed towards his symptoms coming from the L4/5 far lateral disc herniation.  Discussed a L4/5 right-sided facetectomy to get to the far lateral disc herniation and decompress the exiting nerve root.  Due to the destabilizing nature of this procedure, discussed L4/5 TLIF and PSIF as a part of the procedure.  After discussion, patient elected to proceed -Patient will next be seen at date of surgery     The patient has symptoms consistent with lumbar radiculopathy. The patient's symptoms did not improve with conservative treatment so operative management was discussed in the form of L4/5 right-sided facetectomy, L4/5 TLIF, L4/5 PSIF. The risks including but not limited to dural tear, nerve root injury, paralysis, persistent pain, pseudarthrosis, infection, bleeding, hardware failure, adjacent segment disease, heart attack, death, stroke, fracture, and need for additional procedures were discussed with the patient. The benefit of the surgery would be improvement in the patient's radiating posterior hip pain. I explained that back pain relief is not reliably alleviated with this surgery. The alternatives to surgical management were covered with the patient and included continued monitoring, physical therapy, over-the-counter pain medications, ambulatory aids, repeat injections, pain management, and activity modification. All the patient's questions were answered to his and his wife's satisfaction. After this  discussion, the patient expressed understanding and elected to proceed with surgical intervention.      ___________________________________________________________________________     History:   Patient is a 58 y.o. male who presents today for follow-up on his lumbar spine.  Patient continues to have significant low back pain and right posterior hip and buttock pain.  He feels the pain on a daily basis.  He feels the pain with activity and at rest.  The pain has gotten to the point that it interferes with his everyday activities.  He has been having difficulty working as result of the pain.    Physical Exam:   General: no acute distress, appears stated age Neurologic: alert, answering questions appropriately, following commands Respiratory: unlabored breathing on room air, symmetric chest rise Psychiatric: appropriate affect, normal cadence to speech     MSK (spine):   -Strength exam                                                   Left                  Right EHL                              5/5                  5/5 TA  5/5                  5/5 GSC                             5/5                  5/5 Knee extension            5/5                  5/5 Hip flexion                    5/5                  5/5   -Sensory exam                           Sensation intact to light touch in L3-S1 nerve distributions of bilateral lower extremities   Imaging: XRs of the lumbar spine from 11/03/2023 was previously independently reviewed and interpreted, showing no significant degenerative changes.  No fracture or dislocation seen.  No evidence of instability on flexion/extension views.   MRI of the lumbar spine from 12/22/2023 was previously independent reviewed and interpreted, showing small far lateral disc herniation at L4/5 on the right.  No other significant neural compression seen.  No significant DDD.     Patient name: Shane [name] Patient MRN:  [id number] Date of visit: 02/12/24

## 2024-02-27 ENCOUNTER — Encounter: Payer: Self-pay | Admitting: Orthopedic Surgery

## 2024-03-04 ENCOUNTER — Encounter: Payer: Self-pay | Admitting: Orthopedic Surgery

## 2024-03-14 ENCOUNTER — Ambulatory Visit: Admitting: Orthopedic Surgery

## 2024-03-25 ENCOUNTER — Encounter: Payer: Self-pay | Admitting: Internal Medicine

## 2024-03-25 ENCOUNTER — Ambulatory Visit (HOSPITAL_COMMUNITY)
Admission: RE | Admit: 2024-03-25 | Discharge: 2024-03-25 | Disposition: A | Discharge status: Home / Self Care | Source: Ambulatory Visit | Attending: Internal Medicine | Admitting: Internal Medicine

## 2024-03-25 ENCOUNTER — Ambulatory Visit: Admitting: Internal Medicine

## 2024-03-25 ENCOUNTER — Ambulatory Visit: Payer: Self-pay | Admitting: Internal Medicine

## 2024-03-25 VITALS — BP 140/84 | HR 73 | Temp 98.2°F | Wt 276.6 lb

## 2024-03-25 DIAGNOSIS — R600 Localized edema: Secondary | ICD-10-CM

## 2024-03-25 MED ORDER — DOXYCYCLINE HYCLATE 100 MG PO TABS: 100.0000 mg | ORAL_TABLET | Freq: Two times a day (BID) | ORAL | 0 refills | Status: AC

## 2024-03-25 NOTE — Progress Notes (Signed)
 Established Patient Office Visit     CC/Reason for Visit: Right lower leg swelling  HPI: Shane [name] is a 58 y.o. male who is coming in today for the above mentioned reasons.  For the past 3 days he has been noticing swelling of his right lower leg.  He states today it feels very tight and warm to touch which prompted him to secure this appointment.  He had a hard time putting on his work boots today.  He is concerned because he was admitted to the hospital in the past with sepsis due to lower extremity cellulitis.  No recent injuries or insect bites that he can recall.  No chest pain, no shortness of breath.   Past Medical/Surgical History: Past Medical History:  Diagnosis Date   Chronic knee pain    GERD (gastroesophageal reflux disease)    Left shoulder pain    Obesity     Past Surgical History:  Procedure Laterality Date   carpal pummel repair Bilateral    CERVICAL DISC SURGERY  2003   per Dr. Alix    COLONOSCOPY  05/10/2018   per Dr. Shila, internal hemorrhoids and sessile serrated polyps, repeat in 3 yrs    KNEE SURGERY Right     Social History:  reports that he has never smoked. He has never used smokeless tobacco. He reports that he does not drink alcohol and does not use drugs.  Allergies: Allergies  Allergen Reactions   Penicillins Cross Reactors Rash    Family History:  Family History  Problem Relation Age of Onset   Heart disease Mother        mitrial valve repair, CABG    Prostate cancer Father    Hypertension Father    Colon cancer Father        in his early 43's   Esophageal cancer Neg Hx    Rectal cancer Neg Hx    Stomach cancer Neg Hx      Current Outpatient Medications:    amLODipine  (NORVASC ) 5 MG tablet, Take 1 tablet (5 mg total) by mouth daily., Disp: 90 tablet, Rfl: 3   aspirin  EC 81 MG tablet, Take 1 tablet (81 mg total) by mouth daily. Swallow whole., Disp: 30 tablet, Rfl: 3   atorvastatin  (LIPITOR) 80 MG tablet,  Take 1 tablet (80 mg total) by mouth daily., Disp: 90 tablet, Rfl: 1   ciclopirox  (PENLAC ) 8 % solution, Apply topically at bedtime. Apply over nail and surrounding skin. Apply daily over previous coat. After seven (7) days, may remove with alcohol and continue cycle., Disp: 6.6 mL, Rfl: 0   ciclopirox  (PENLAC ) 8 % solution, Apply topically at bedtime. Apply over nail and surrounding skin. Apply daily over previous coat. After seven (7) days, may remove with alcohol and continue cycle., Disp: 6.6 mL, Rfl: 1   cyclobenzaprine  (FLEXERIL ) 5 MG tablet, Take 1 tablet (5 mg total) by mouth at bedtime as needed for muscle spasms., Disp: 30 tablet, Rfl: 0   doxycycline  (VIBRA -TABS) 100 MG tablet, Take 1 tablet (100 mg total) by mouth 2 (two) times daily for 10 days., Disp: 20 tablet, Rfl: 0   ketoconazole  (NIZORAL ) 2 % cream, Apply 1 Application topically daily., Disp: 60 g, Rfl: 1   ketoconazole  (NIZORAL ) 2 % cream, Apply 1 Application topically daily., Disp: 60 g, Rfl: 3   meloxicam  (MOBIC ) 7.5 MG tablet, Take 1 tablet (7.5 mg total) by mouth daily., Disp: 30 tablet, Rfl: 0   nitroGLYCERIN  (  NITROSTAT ) 0.4 MG SL tablet, Place 1 tablet (0.4 mg total) under the tongue every 5 (five) minutes x 3 doses as needed for chest pain., Disp: 20 tablet, Rfl: 1   predniSONE  (DELTASONE ) 10 MG tablet, Take 4 tabs every morning for 3 days, 3 tabs for 2 days, 2 tabs for 2 days, 1 tab for 1 day., Disp: 23 tablet, Rfl: 0   predniSONE  (STERAPRED UNI-PAK 21 TAB) 10 MG (21) TBPK tablet, Take as directed, Disp: 21 tablet, Rfl: 0   tolnaftate  (TINACTIN) 1 % powder, Apply 1 Application topically 2 (two) times daily., Disp: 45 g, Rfl: 0  Review of Systems:  Negative unless indicated in HPI.   Physical Exam: Vitals:   03/25/24 1311 03/25/24 1315  BP: (!) 150/88 (!) 140/84  Pulse: 73   Temp: 98.2 F (36.8 C)   TempSrc: Oral   SpO2: 99%   Weight: 276 lb 9.6 oz (125.5 kg)     Body mass index is 39.69 kg/m.   Physical  Exam Musculoskeletal:     Right lower leg: Swelling present. 4+ Edema present.      Impression and Plan:  Edema of right lower extremity -     VAS US  LOWER EXTREMITY VENOUS (DVT); Future -     Doxycycline  Hyclate; Take 1 tablet (100 mg total) by mouth 2 (two) times daily for 10 days.  Dispense: 20 tablet; Refill: 0   - I see no evidence of cellulitis on exam today, no redness.  I am more concerned for a potential blood clot.  Will arrange for an urgent lower extremity Doppler.  I will send a prescription for doxycycline  just in case Doppler ends up negative he can go ahead and take it.  Time spent:31 minutes reviewing chart, interviewing and examining patient and formulating plan of care.     [name], MD [facility] at [facility]

## 2024-03-26 ENCOUNTER — Ambulatory Visit: Payer: Self-pay

## 2024-03-26 NOTE — Telephone Encounter (Signed)
 Pt requesting work note, was seen yesterday by pcp. Pt states that he was only given off for one day, would like more time off. Pt states if she does give a work note it needs to say that he can return without restrictions.  Please call pt back and advise.  Copied from CRM [id number]. Topic: Clinical - Red Word Triage >> Mar 26, 2024  3:33 PM Jasmin G wrote: Red Word that prompted transfer to Nurse Triage: Pt states that he was seen yesterday by his PCP, Dr. Theophilus and was given antibiotics to treat leg swelling that he has been dealing with for a few days, test results came out clear for blood clots but pt states that leg is still swollen and doesn't think that he will be able to go work like this, pt is unsure if he can get a note for work to justify him missing or if needs to come in for an appt.

## 2024-03-27 ENCOUNTER — Telehealth: Payer: Self-pay

## 2024-03-27 NOTE — Telephone Encounter (Signed)
 Copied from CRM [id number]. Topic: Clinical - Red Word Triage >> Mar 26, 2024  3:33 PM Jasmin G wrote: Red Word that prompted transfer to Nurse Triage: Pt states that he was seen yesterday by his PCP, Dr. Theophilus and was given antibiotics to treat leg swelling that he has been dealing with for a few days, test results came out clear for blood clots but pt states that leg is still swollen and doesn't think that he will be able to go work like this, pt is unsure if he can get a note for work to justify him missing or if needs to come in for an appt. >> Mar 27, 2024 11:00 AM Franky GRADE wrote: Patient is calling to follow up on this request as he has not heard back from anyone.

## 2024-03-27 NOTE — Telephone Encounter (Signed)
 Spoke to the patient and a letter is ready in MyChart for work.

## 2024-03-29 ENCOUNTER — Ambulatory Visit: Admit: 2024-03-29 | Admitting: Orthopedic Surgery

## 2024-03-29 DIAGNOSIS — Z01818 Encounter for other preprocedural examination: Secondary | ICD-10-CM

## 2024-03-29 SURGERY — POSTERIOR LUMBAR FUSION 1 LEVEL
Anesthesia: General

## 2024-04-01 ENCOUNTER — Ambulatory Visit: Payer: Self-pay

## 2024-04-01 ENCOUNTER — Ambulatory Visit: Admitting: Internal Medicine

## 2024-04-01 VITALS — BP 120/70 | HR 80 | Temp 98.5°F | Wt 278.1 lb

## 2024-04-01 DIAGNOSIS — R600 Localized edema: Secondary | ICD-10-CM | POA: Diagnosis not present

## 2024-04-01 NOTE — Telephone Encounter (Addendum)
 FYI Only or Action Required?: FYI only for provider. Scheduled for acute appointment today at 4:00 PM  Patient was last seen in primary care on 03/25/2024 by Theophilus Andrews, Tully GRADE, MD.  Called Nurse Triage reporting Leg Swelling.  Symptoms began a week ago.  Interventions attempted: Rest, hydration, or home remedies.  Symptoms are: unchanged.  Triage Disposition: See HCP Within 4 Hours (Or PCP Triage)  Patient/caregiver understands and will follow disposition?: Yes  Copied from CRM [id number]. Topic: Clinical - Red Word Triage >> Apr 01, 2024  8:23 AM Pinkey ORN wrote: Red Word that prompted transfer to Nurse Triage: Swelling (Right Foot) >> Apr 01, 2024  8:23 AM Pinkey ORN wrote: Patient states he right foot is swollen and has been since last week.  Reason for Disposition  SEVERE leg swelling (e.g., swelling extends above knee, entire leg is swollen, weeping fluid)  Answer Assessment - Initial Assessment Questions Patient describes swelling to right leg as hard with the skin not having much give. Patient is concerned as he was hospitalized in the past for an infection to his leg. Patient scheduled for acute appointment today at 4:00 PM with PCP.   1. ONSET: When did the swelling start? (e.g., minutes, hours, days)     Started a week ago Monday-was seen last Monday by primary 2. LOCATION: What part of the leg is swollen?  Are both legs swollen or just one leg?     Right leg-from calf down 3. SEVERITY: How bad is the swelling? (e.g., localized; mild, moderate, severe)     Moderate-severe 4. REDNESS: Is there redness or signs of infection?     Patient states he doesn't see a lot of redness but says the leg is warm to the touch 5. PAIN: Is the swelling painful to touch? If Yes, ask: How painful is it?   (Scale 1-10; mild, moderate or severe)     mild 6. FEVER: Do you have a fever? If Yes, ask: What is it, how was it measured, and when did it start?      No  fever 7. CAUSE: What do you think is causing the leg swelling?     Patient was given antibiotics last week 8. MEDICAL HISTORY: Do you have a history of blood clots (e.g., DVT), cancer, heart failure, kidney disease, or liver failure?     Was checked for blood clots last week.  9. RECURRENT SYMPTOM: Have you had leg swelling before? If Yes, ask: When was the last time? What happened that time?     Yes-was hospitalized for sepsis a couple of years ago involving infection to his leg 10. OTHER SYMPTOMS: Do you have any other symptoms? (e.g., chest pain, difficulty breathing)       no  Protocols used: Leg Swelling and Edema-A-AH

## 2024-04-02 ENCOUNTER — Encounter: Payer: Self-pay | Admitting: Internal Medicine

## 2024-04-02 NOTE — Progress Notes (Signed)
 Established Patient Office Visit     CC/Reason for Visit: Continued lower extremity edema  HPI: Shane [name] is a 58 y.o. male who is coming in today for the above mentioned reasons.  He was seen in the office on September 15 as an acute visit for right greater than left lower extremity edema.  He was having difficulty putting on his work boots.  He was very concerned because he has been admitted in the past with right lower extremity cellulitis.  That day we did an urgent Doppler that was negative for DVT.  He was prescribed doxycycline  which he has a few days left of.  He has noticed no improvement of his edema.  After reviewing patient medications with him he admits that he has not been taking his amlodipine  in over 10 months, I have removed this from his medication list.  He had a 2D echocardiogram in July 2024 that showed a normal ejection fraction of 60 to 65%, no wall motion abnormalities, no diastolic dysfunction.  Recent labs showed normal albumin levels as well as normal kidney and liver function.   Past Medical/Surgical History: Past Medical History:  Diagnosis Date   Chronic knee pain    GERD (gastroesophageal reflux disease)    Left shoulder pain    Obesity     Past Surgical History:  Procedure Laterality Date   carpal pummel repair Bilateral    CERVICAL DISC SURGERY  2003   per Dr. Alix    COLONOSCOPY  05/10/2018   per Dr. Shila, internal hemorrhoids and sessile serrated polyps, repeat in 3 yrs    KNEE SURGERY Right     Social History:  reports that he has never smoked. He has never used smokeless tobacco. He reports that he does not drink alcohol and does not use drugs.  Allergies: Allergies  Allergen Reactions   Penicillins Cross Reactors Rash    Family History:  Family History  Problem Relation Age of Onset   Heart disease Mother        mitrial valve repair, CABG    Prostate cancer Father    Hypertension Father    Colon cancer Father         in his early 3's   Esophageal cancer Neg Hx    Rectal cancer Neg Hx    Stomach cancer Neg Hx      Current Outpatient Medications:    aspirin  EC 81 MG tablet, Take 1 tablet (81 mg total) by mouth daily. Swallow whole., Disp: 30 tablet, Rfl: 3   atorvastatin  (LIPITOR) 80 MG tablet, Take 1 tablet (80 mg total) by mouth daily., Disp: 90 tablet, Rfl: 1   ciclopirox  (PENLAC ) 8 % solution, Apply topically at bedtime. Apply over nail and surrounding skin. Apply daily over previous coat. After seven (7) days, may remove with alcohol and continue cycle., Disp: 6.6 mL, Rfl: 0   ciclopirox  (PENLAC ) 8 % solution, Apply topically at bedtime. Apply over nail and surrounding skin. Apply daily over previous coat. After seven (7) days, may remove with alcohol and continue cycle., Disp: 6.6 mL, Rfl: 1   cyclobenzaprine  (FLEXERIL ) 5 MG tablet, Take 1 tablet (5 mg total) by mouth at bedtime as needed for muscle spasms., Disp: 30 tablet, Rfl: 0   doxycycline  (VIBRA -TABS) 100 MG tablet, Take 1 tablet (100 mg total) by mouth 2 (two) times daily for 10 days., Disp: 20 tablet, Rfl: 0   ketoconazole  (NIZORAL ) 2 % cream, Apply 1 Application topically  daily., Disp: 60 g, Rfl: 1   meloxicam  (MOBIC ) 7.5 MG tablet, Take 1 tablet (7.5 mg total) by mouth daily., Disp: 30 tablet, Rfl: 0   nitroGLYCERIN  (NITROSTAT ) 0.4 MG SL tablet, Place 1 tablet (0.4 mg total) under the tongue every 5 (five) minutes x 3 doses as needed for chest pain., Disp: 20 tablet, Rfl: 1   predniSONE  (DELTASONE ) 10 MG tablet, Take 4 tabs every morning for 3 days, 3 tabs for 2 days, 2 tabs for 2 days, 1 tab for 1 day., Disp: 23 tablet, Rfl: 0   predniSONE  (STERAPRED UNI-PAK 21 TAB) 10 MG (21) TBPK tablet, Take as directed, Disp: 21 tablet, Rfl: 0   tolnaftate  (TINACTIN) 1 % powder, Apply 1 Application topically 2 (two) times daily., Disp: 45 g, Rfl: 0   ketoconazole  (NIZORAL ) 2 % cream, Apply 1 Application topically daily., Disp: 60 g, Rfl:  3  Review of Systems:  Negative unless indicated in HPI.   Physical Exam: Vitals:   04/01/24 1551  BP: 120/70  Pulse: 80  Temp: 98.5 F (36.9 C)  TempSrc: Oral  SpO2: 97%  Weight: 278 lb 1.6 oz (126.1 kg)    Body mass index is 39.9 kg/m.   Physical Exam Vitals reviewed.  Constitutional:      Appearance: Normal appearance. He is obese.  HENT:     Head: Normocephalic and atraumatic.  Eyes:     Conjunctiva/sclera: Conjunctivae normal.  Cardiovascular:     Rate and Rhythm: Normal rate and regular rhythm.  Pulmonary:     Effort: Pulmonary effort is normal.     Breath sounds: Normal breath sounds.  Musculoskeletal:     Right lower leg: Edema present.     Left lower leg: Edema present.  Skin:    General: Skin is warm and dry.     Comments: He has skin color changes consistent with venous insufficiency of his bilateral feet especially around the medial malleolus.  Neurological:     General: No focal deficit present.     Mental Status: He is alert and oriented to person, place, and time.  Psychiatric:        Mood and Affect: Mood normal.        Behavior: Behavior normal.        Thought Content: Thought content normal.        Judgment: Judgment normal.      Impression and Plan:  Bilateral lower extremity edema  -After eliminating multiple causes including DVT, cellulitis, heart failure, liver or renal dysfunction, use of amlodipine , I believe he likely has chronic venous insufficiency.  Have advised to get fitted for and wear compression stockings, he agrees.   Time spent:32 minutes reviewing chart, interviewing and examining patient and formulating plan of care.     [name], MD [facility] at [facility]

## 2024-04-09 ENCOUNTER — Encounter: Payer: Self-pay | Admitting: Internal Medicine

## 2024-04-09 ENCOUNTER — Ambulatory Visit: Admitting: Internal Medicine

## 2024-04-09 VITALS — BP 110/80 | HR 61 | Temp 97.7°F | Wt 278.7 lb

## 2024-04-09 DIAGNOSIS — I872 Venous insufficiency (chronic) (peripheral): Secondary | ICD-10-CM

## 2024-04-09 DIAGNOSIS — E782 Mixed hyperlipidemia: Secondary | ICD-10-CM | POA: Diagnosis not present

## 2024-04-09 DIAGNOSIS — R600 Localized edema: Secondary | ICD-10-CM | POA: Diagnosis not present

## 2024-04-09 LAB — LIPID PANEL
Cholesterol: 192 mg/dL (ref 0–200)
HDL: 37.7 mg/dL — ABNORMAL LOW (ref >39.00)
LDL Cholesterol: 133 mg/dL — ABNORMAL HIGH (ref 0–99)
NonHDL: 153.8
Total CHOL/HDL Ratio: 5
Triglycerides: 103 mg/dL (ref 0.0–149.0)
VLDL: 20.6 mg/dL (ref 0.0–40.0)

## 2024-04-09 NOTE — Progress Notes (Signed)
 Established Patient Office Visit     CC/Reason for Visit: Continued bilateral lower extremity swelling  HPI: Shane [name] is a 58 y.o. male who is coming in today for the above mentioned reasons.  Has been ongoing now for about a month although he chronically has some lower extremity swelling.  A recap of workup that has been done:  On September 15 we did an urgent Doppler that was negative for DVT. He was prescribed doxycycline  which he has a few days left of. He has noticed no improvement of his edema. After reviewing patient medications with him he admits that he has not been taking his amlodipine  in over 10 months, I have removed this from his medication list. He had a 2D echocardiogram in July 2024 that showed a normal ejection fraction of 60 to 65%, no wall motion abnormalities, no diastolic dysfunction. Recent labs showed normal albumin levels as well as normal kidney and liver function.   He did start wearing compression stockings, however it appears the size is too small and it is causing bruising to his upper calf area.  Past Medical/Surgical History: Past Medical History:  Diagnosis Date   Chronic knee pain    GERD (gastroesophageal reflux disease)    Left shoulder pain    Obesity     Past Surgical History:  Procedure Laterality Date   carpal pummel repair Bilateral    CERVICAL DISC SURGERY  2003   per Dr. Alix    COLONOSCOPY  05/10/2018   per Dr. Shila, internal hemorrhoids and sessile serrated polyps, repeat in 3 yrs    KNEE SURGERY Right     Social History:  reports that he has never smoked. He has never used smokeless tobacco. He reports that he does not drink alcohol and does not use drugs.  Allergies: Allergies  Allergen Reactions   Penicillins Cross Reactors Rash    Family History:  Family History  Problem Relation Age of Onset   Heart disease Mother        mitrial valve repair, CABG    Prostate cancer Father    Hypertension Father     Colon cancer Father        in his early 51's   Esophageal cancer Neg Hx    Rectal cancer Neg Hx    Stomach cancer Neg Hx      Current Outpatient Medications:    aspirin  EC 81 MG tablet, Take 1 tablet (81 mg total) by mouth daily. Swallow whole., Disp: 30 tablet, Rfl: 3   atorvastatin  (LIPITOR) 80 MG tablet, Take 1 tablet (80 mg total) by mouth daily., Disp: 90 tablet, Rfl: 1   ciclopirox  (PENLAC ) 8 % solution, Apply topically at bedtime. Apply over nail and surrounding skin. Apply daily over previous coat. After seven (7) days, may remove with alcohol and continue cycle., Disp: 6.6 mL, Rfl: 0   ciclopirox  (PENLAC ) 8 % solution, Apply topically at bedtime. Apply over nail and surrounding skin. Apply daily over previous coat. After seven (7) days, may remove with alcohol and continue cycle., Disp: 6.6 mL, Rfl: 1   cyclobenzaprine  (FLEXERIL ) 5 MG tablet, Take 1 tablet (5 mg total) by mouth at bedtime as needed for muscle spasms., Disp: 30 tablet, Rfl: 0   ketoconazole  (NIZORAL ) 2 % cream, Apply 1 Application topically daily., Disp: 60 g, Rfl: 1   ketoconazole  (NIZORAL ) 2 % cream, Apply 1 Application topically daily., Disp: 60 g, Rfl: 3   meloxicam  (MOBIC ) 7.5 MG  tablet, Take 1 tablet (7.5 mg total) by mouth daily., Disp: 30 tablet, Rfl: 0   nitroGLYCERIN  (NITROSTAT ) 0.4 MG SL tablet, Place 1 tablet (0.4 mg total) under the tongue every 5 (five) minutes x 3 doses as needed for chest pain., Disp: 20 tablet, Rfl: 1   predniSONE  (DELTASONE ) 10 MG tablet, Take 4 tabs every morning for 3 days, 3 tabs for 2 days, 2 tabs for 2 days, 1 tab for 1 day., Disp: 23 tablet, Rfl: 0   predniSONE  (STERAPRED UNI-PAK 21 TAB) 10 MG (21) TBPK tablet, Take as directed, Disp: 21 tablet, Rfl: 0   tolnaftate  (TINACTIN) 1 % powder, Apply 1 Application topically 2 (two) times daily., Disp: 45 g, Rfl: 0  Review of Systems:  Negative unless indicated in HPI.   Physical Exam: Vitals:   04/09/24 0724  BP: 110/80   Pulse: 61  Temp: 97.7 F (36.5 C)  TempSrc: Oral  SpO2: 99%  Weight: 278 lb 11.2 oz (126.4 kg)    Body mass index is 39.99 kg/m.   Physical Exam Musculoskeletal:     Right lower leg: Swelling present. 3+ Edema present.     Left lower leg: Swelling present. 3+ Edema present.      Impression and Plan:  Bilateral lower extremity edema -     Ambulatory referral to Vascular Surgery  Chronic venous insufficiency -     Ambulatory referral to Vascular Surgery   - As discussed during previous visits I suspect this is chronic venous insufficiency.  Has had a negative workup for other causes.  Have advised him to go back and get a larger size of compression stockings.  Will send referral to vein and vascular as well.  Time spent:22 minutes reviewing chart, interviewing and examining patient and formulating plan of care.     Shane Theophilus Andrews, MD [facility] at [facility]

## 2024-04-11 ENCOUNTER — Encounter: Admitting: Orthopedic Surgery

## 2024-04-12 ENCOUNTER — Other Ambulatory Visit

## 2024-04-15 ENCOUNTER — Encounter: Payer: Self-pay | Admitting: Internal Medicine

## 2024-04-15 ENCOUNTER — Ambulatory Visit: Admitting: Internal Medicine

## 2024-04-15 ENCOUNTER — Ambulatory Visit: Payer: Self-pay | Admitting: Internal Medicine

## 2024-05-13 ENCOUNTER — Encounter: Payer: Self-pay | Admitting: Radiology

## 2024-06-21 ENCOUNTER — Encounter: Payer: Self-pay | Admitting: Cardiovascular Disease

## 2024-07-01 ENCOUNTER — Other Ambulatory Visit: Payer: Self-pay

## 2024-07-01 DIAGNOSIS — I872 Venous insufficiency (chronic) (peripheral): Secondary | ICD-10-CM

## 2024-07-26 ENCOUNTER — Ambulatory Visit (HOSPITAL_COMMUNITY)

## 2024-07-29 ENCOUNTER — Ambulatory Visit: Payer: Self-pay

## 2024-07-29 NOTE — Telephone Encounter (Signed)
 FYI Only or Action Required?: FYI only for provider: appointment scheduled on 1/21.  Patient was last seen in primary care on 04/09/2024 by Shane [name], Tully GRADE, MD.  Called Nurse Triage reporting Foot Pain.  Symptoms began yesterday.  Interventions attempted: OTC medications: Tylenol .  Symptoms are: gradually worsening.  Triage Disposition: See PCP When Office is Open (Within 3 Days)  Patient/caregiver understands and will follow disposition?: Yes    Left heel pain x4 weeks. 7-9/10 at worst. At best 3/10 pain. No recent injury. Randomly appeared. Heel is dry and hard. Onset fever, chills and shakes yesterday. Has not checked temp.    Also reports chronic cyst in left testicle for years. This morning woke up with pain in left teste. 4-5/10 intermittent pain with movement. Lasts a few minutes. No rash or discoloration. Feels higher. Unsure if it's swollen, has not checked it. Appt scheduled on 1/21 with PCP. Advised UC or ED for worsening symptoms.     Message from [place] [facility] sent at 07/29/2024  4:30 PM EST  Summary: Left heel pain   Reason for Triage: Left heel pain. Patient feels like he is stepping on glass. Patient also has fever, chills, and the shakes.         Reason for Disposition  [1] MODERATE pain (e.g., interferes with normal activities, limping) AND [2] present > 3 days  [1] Brief pain in scrotum or testicle AND [2] present < 1 hour AND [3] recurrent  (NO swelling)  Answer Assessment - Initial Assessment Questions 1. ONSET: When did the pain start?      1 month ago  2. LOCATION: Where is the pain located?      Left heel  3. PAIN: How bad is the pain?    (Scale 1-10; or mild, moderate, severe)     7-9/10 at worst. At best 3/10 pain.  4. WORK OR EXERCISE: Has there been any recent work or exercise that involved this part of the body?      Denies  5. CAUSE: What do you think is causing the foot pain?     Unsure  6. OTHER SYMPTOMS: Do you  have any other symptoms? (e.g., leg pain, rash, fever, numbness)     Worse with weight bearing.  Answer Assessment - Initial Assessment Questions 1. LOCATION and RADIATION: Where is the pain located?      Left teste  2. QUALITY: What does the pain feel like?  (e.g., sharp, dull, aching, burning)     Sharp  3. SEVERITY: How bad is the pain?  (Scale 1-10; or mild, moderate, severe)   - MILD (1-3): doesn't interfere with normal activities    - MODERATE (4-7): interferes with normal activities (e.g., work or school) or awakens from sleep   - SEVERE (8-10): excruciating pain, unable to do any normal activities, difficulty walking     4-5/10  4. ONSET: When did the pain start?     This morning  5. PATTERN: Does it come and go, or has it been constant since it started?     Comes and goes, lasts a few minutes.  6. SCROTAL APPEARANCE: What does the scrotum look like? Is there any swelling or redness?      Has not checked appearance  7. HERNIA: Has a doctor ever told you that you have a hernia?     Denies  8. OTHER SYMPTOMS: Do you have any other symptoms? (e.g., fever, abdominal pain, vomiting, difficulty passing urine)  Feeling feverish, has not checked temp.  Protocols used: Foot Pain-A-AH, Scrotal Pain-A-AH

## 2024-07-31 ENCOUNTER — Ambulatory Visit: Admitting: Internal Medicine

## 2024-07-31 ENCOUNTER — Ambulatory Visit (HOSPITAL_BASED_OUTPATIENT_CLINIC_OR_DEPARTMENT_OTHER)
Admission: RE | Admit: 2024-07-31 | Discharge: 2024-07-31 | Disposition: A | Discharge status: Home / Self Care | Source: Ambulatory Visit | Attending: Internal Medicine | Admitting: Internal Medicine

## 2024-07-31 ENCOUNTER — Encounter: Payer: Self-pay | Admitting: Internal Medicine

## 2024-07-31 VITALS — BP 130/70 | HR 108 | Temp 101.2°F | Wt 271.2 lb

## 2024-07-31 DIAGNOSIS — M79672 Pain in left foot: Secondary | ICD-10-CM

## 2024-07-31 DIAGNOSIS — N50812 Left testicular pain: Secondary | ICD-10-CM | POA: Diagnosis present

## 2024-07-31 DIAGNOSIS — N5089 Other specified disorders of the male genital organs: Secondary | ICD-10-CM | POA: Insufficient documentation

## 2024-07-31 MED ORDER — DOXYCYCLINE HYCLATE 100 MG PO TABS: 100.0000 mg | ORAL_TABLET | Freq: Two times a day (BID) | ORAL | 0 refills | Status: AC

## 2024-08-01 ENCOUNTER — Encounter: Payer: Self-pay | Admitting: Internal Medicine

## 2024-08-01 NOTE — Progress Notes (Signed)
 "    Established Patient Office Visit     CC/Reason for Visit: Left scrotal and left heel pain  HPI: Shane [name] is a 59 y.o. male who is coming in today for the above mentioned reasons.  1.  His left heel has been bothering him now for a few weeks very hard to bear weight on it.  He does not recall any injury, new shoes or stepping on anything sharp.  2.  He has had left scrotal pain and swelling for a few days.  He feels like his left testes is ascended.  He is noted to have a temperature 101.5 on arrival today.  No URI symptoms, no abdominal pain, no headache, no nausea or vomiting.   Past Medical/Surgical History: Past Medical History:  Diagnosis Date   Chronic knee pain    GERD (gastroesophageal reflux disease)    Left shoulder pain    Obesity     Past Surgical History:  Procedure Laterality Date   carpal pummel repair Bilateral    CERVICAL DISC SURGERY  2003   per Dr. Alix    COLONOSCOPY  05/10/2018   per Dr. Shila, internal hemorrhoids and sessile serrated polyps, repeat in 3 yrs    KNEE SURGERY Right     Social History:  reports that he has never smoked. He has never used smokeless tobacco. He reports that he does not drink alcohol and does not use drugs.  Allergies: Allergies[1]  Family History:  Family History  Problem Relation Age of Onset   Heart disease Mother        mitrial valve repair, CABG    Prostate cancer Father    Hypertension Father    Colon cancer Father        in his early 36's   Esophageal cancer Neg Hx    Rectal cancer Neg Hx    Stomach cancer Neg Hx     Current Medications[2]  Review of Systems:  Negative unless indicated in HPI.   Physical Exam: Vitals:   07/31/24 1324  BP: 130/70  Pulse: (!) 108  Temp: (!) 101.2 F (38.4 C)  TempSrc: Oral  SpO2: 97%  Weight: 271 lb 3.2 oz (123 kg)    Body mass index is 38.91 kg/m.   Physical Exam Vitals reviewed.  Constitutional:      Appearance: Normal  appearance. He is obese.  HENT:     Head: Normocephalic and atraumatic.  Eyes:     Conjunctiva/sclera: Conjunctivae normal.  Cardiovascular:     Rate and Rhythm: Normal rate and regular rhythm.  Pulmonary:     Effort: Pulmonary effort is normal.     Breath sounds: Normal breath sounds.  Abdominal:     General: Abdomen is protuberant. Bowel sounds are normal. There is no distension.     Tenderness: There is no abdominal tenderness.     Hernia: There is no hernia in the left inguinal area.  Genitourinary:    Penis: Normal and circumcised.      Epididymis:     Left: Enlarged.  Skin:    General: Skin is warm and dry.  Neurological:     General: No focal deficit present.     Mental Status: He is alert and oriented to person, place, and time.  Psychiatric:        Mood and Affect: Mood normal.        Behavior: Behavior normal.        Thought Content: Thought content normal.  Judgment: Judgment normal.      Impression and Plan:  Pain in left testicle -     Doxycycline  Hyclate; Take 1 tablet (100 mg total) by mouth 2 (two) times daily for 7 days.  Dispense: 14 tablet; Refill: 0 -     US  SCROTUM W/DOPPLER; Future -     US  SCROTUM W/DOPPLER; Future  Ascended left testicle -     Doxycycline  Hyclate; Take 1 tablet (100 mg total) by mouth 2 (two) times daily for 7 days.  Dispense: 14 tablet; Refill: 0 -     US  SCROTUM W/DOPPLER; Future -     US  SCROTUM W/DOPPLER; Future  Pain of left heel -     Ambulatory referral to Podiatry  -Concern for possibly left testicular torsion.  Will send for stat ultrasound of testes. - Given fever and findings of left testicle will give week course of doxycycline . - See no reason to test for flu or COVID as has no URI symptoms. - In regards to his left heel, there is no foreign body, no skin deficit, no callus or corn that I can identify on exam.  I will place referral to podiatry, questionable heel spur.   Time spent:32 minutes reviewing  chart, interviewing and examining patient and formulating plan of care.     [name], MD [facility]     [1]  Allergies Allergen Reactions   Penicillins Cross Reactors Rash  [2]  Current Outpatient Medications:    aspirin  EC 81 MG tablet, Take 1 tablet (81 mg total) by mouth daily. Swallow whole., Disp: 30 tablet, Rfl: 3   atorvastatin  (LIPITOR) 80 MG tablet, Take 1 tablet (80 mg total) by mouth daily., Disp: 90 tablet, Rfl: 1   ciclopirox  (PENLAC ) 8 % solution, Apply topically at bedtime. Apply over nail and surrounding skin. Apply daily over previous coat. After seven (7) days, may remove with alcohol and continue cycle., Disp: 6.6 mL, Rfl: 0   ciclopirox  (PENLAC ) 8 % solution, Apply topically at bedtime. Apply over nail and surrounding skin. Apply daily over previous coat. After seven (7) days, may remove with alcohol and continue cycle., Disp: 6.6 mL, Rfl: 1   cyclobenzaprine  (FLEXERIL ) 5 MG tablet, Take 1 tablet (5 mg total) by mouth at bedtime as needed for muscle spasms., Disp: 30 tablet, Rfl: 0   doxycycline  (VIBRA -TABS) 100 MG tablet, Take 1 tablet (100 mg total) by mouth 2 (two) times daily for 7 days., Disp: 14 tablet, Rfl: 0   ketoconazole  (NIZORAL ) 2 % cream, Apply 1 Application topically daily., Disp: 60 g, Rfl: 1   ketoconazole  (NIZORAL ) 2 % cream, Apply 1 Application topically daily., Disp: 60 g, Rfl: 3   meloxicam  (MOBIC ) 7.5 MG tablet, Take 1 tablet (7.5 mg total) by mouth daily., Disp: 30 tablet, Rfl: 0   nitroGLYCERIN  (NITROSTAT ) 0.4 MG SL tablet, Place 1 tablet (0.4 mg total) under the tongue every 5 (five) minutes x 3 doses as needed for chest pain., Disp: 20 tablet, Rfl: 1   predniSONE  (DELTASONE ) 10 MG tablet, Take 4 tabs every morning for 3 days, 3 tabs for 2 days, 2 tabs for 2 days, 1 tab for 1 day., Disp: 23 tablet, Rfl: 0   predniSONE  (STERAPRED UNI-PAK 21 TAB) 10 MG (21) TBPK tablet, Take as directed, Disp: 21 tablet, Rfl:  0   tolnaftate  (TINACTIN) 1 % powder, Apply 1 Application topically 2 (two) times daily., Disp: 45 g, Rfl: 0  "

## 2024-08-05 ENCOUNTER — Ambulatory Visit (HOSPITAL_COMMUNITY)

## 2024-08-05 ENCOUNTER — Ambulatory Visit

## 2024-08-09 ENCOUNTER — Ambulatory Visit

## 2024-08-09 DIAGNOSIS — M722 Plantar fascial fibromatosis: Secondary | ICD-10-CM

## 2024-08-09 NOTE — Patient Instructions (Signed)

## 2024-08-09 NOTE — Progress Notes (Unsigned)
 "  Subjective:  Patient ID: Shane [name], male    DOB: 1965-12-24,  MRN: [id number]  Chief Complaint  Patient presents with   Plantar Fasciitis    Rm5 Patient complains of left heel pain for 1 month/ hurts with pressure, walking and standing/no treatment.    59 y.o. male presents with the above complaint.  He has had left heel pain for approximately 1 month.  He states that he wears work boots consistently.  When he is at home, he wears slippers.  He notes that there is increased pain in slippers.  The pain is worse after sitting down for extended period of time  Review of Systems: Negative except as noted in the HPI. Denies N/V/F/Ch.  Past Medical History:  Diagnosis Date   Chronic knee pain    GERD (gastroesophageal reflux disease)    Left shoulder pain    Obesity    Current Medications[1]  Tobacco Use History[2]  Allergies[3] Objective:  There were no vitals filed for this visit. There is no height or weight on file to calculate BMI. Constitutional Well developed. Well nourished. Oriented to person, place, and time.  Vascular Dorsalis pedis pulses palpable bilaterally. Posterior tibial pulses palpable bilaterally. Capillary refill normal to all digits.  No cyanosis or clubbing noted. Pedal hair growth normal.  Neurologic Normal speech. Epicritic sensation to light touch grossly present bilaterally. Negative tinel sign at tarsal tunnel bilaterally.   Dermatologic Skin texture and turgor are within normal limits.  No open wounds. No skin lesions.  Musculoskeletal: Normal HF and 1st MTP ROM without pain or crepitus bilaterally. {ARRECTUS/NO:33522::Rectus footshape}. Tender to palpation at the calcaneal tuber {Left/right:33004}. No pain with calcaneal squeeze {Left/right:33004}. Ankle ROM {Range of motion:16529} {Left/right:33004}. Silfverskiold Test: {Desc; negative/positive:13464::negative} {Left/right:33004}.   Radiographs: Taken and reviewed. No acute  fractures or dislocations. No evidence of stress fracture.  Plantar heel spur {DESC; ABSENT OR PRESENT:19119}. Posterior heel spur {DESC; ABSENT OR PRESENT:19119}. ***  Assessment:   1. Plantar fasciitis, left    Plan:  Patient was evaluated and treated and all questions answered.  Plantar Fasciitis, {Left/right:33004} - XR reviewed as above.  - Educated on icing and stretching. Instructions given.  - Discussed supportive shoegear and use of OTC insoles. Discussed that if insoles are effective, patient would benefit from custom molded insoles to better match foot shape.  - Discussed treatment options to reduce inflammation in plantar fascia. Risks and benefits discussed. The patient would like to proceed with localized steroid injection as described below..   Procedure: Injection Tendon/Ligament Location: Left plantar fascia at the glabrous junction; medial approach. Skin Prep: alcohol Injectate: 0.5 cc 0.5% marcaine plain, 0.5 cc of 1% Lidocaine , 0.5 cc kenalog  10. Disposition: Patient tolerated procedure well. Injection site dressed with a band-aid.   No follow-ups on file.  Prentice Ovens, DPM AACFAS Fellowship Trained Podiatric Surgeon [facility]      [1]  Current Outpatient Medications:    aspirin  EC 81 MG tablet, Take 1 tablet (81 mg total) by mouth daily. Swallow whole., Disp: 30 tablet, Rfl: 3   atorvastatin  (LIPITOR) 80 MG tablet, Take 1 tablet (80 mg total) by mouth daily., Disp: 90 tablet, Rfl: 1   ciclopirox  (PENLAC ) 8 % solution, Apply topically at bedtime. Apply over nail and surrounding skin. Apply daily over previous coat. After seven (7) days, may remove with alcohol and continue cycle., Disp: 6.6 mL, Rfl: 0   ciclopirox  (PENLAC ) 8 % solution, Apply topically at bedtime. Apply  over nail and surrounding skin. Apply daily over previous coat. After seven (7) days, may remove with alcohol and continue cycle., Disp: 6.6 mL, Rfl: 1   cyclobenzaprine   (FLEXERIL ) 5 MG tablet, Take 1 tablet (5 mg total) by mouth at bedtime as needed for muscle spasms., Disp: 30 tablet, Rfl: 0   ketoconazole  (NIZORAL ) 2 % cream, Apply 1 Application topically daily., Disp: 60 g, Rfl: 1   ketoconazole  (NIZORAL ) 2 % cream, Apply 1 Application topically daily., Disp: 60 g, Rfl: 3   meloxicam  (MOBIC ) 7.5 MG tablet, Take 1 tablet (7.5 mg total) by mouth daily., Disp: 30 tablet, Rfl: 0   nitroGLYCERIN  (NITROSTAT ) 0.4 MG SL tablet, Place 1 tablet (0.4 mg total) under the tongue every 5 (five) minutes x 3 doses as needed for chest pain., Disp: 20 tablet, Rfl: 1   predniSONE  (DELTASONE ) 10 MG tablet, Take 4 tabs every morning for 3 days, 3 tabs for 2 days, 2 tabs for 2 days, 1 tab for 1 day., Disp: 23 tablet, Rfl: 0   predniSONE  (STERAPRED UNI-PAK 21 TAB) 10 MG (21) TBPK tablet, Take as directed, Disp: 21 tablet, Rfl: 0   tolnaftate  (TINACTIN) 1 % powder, Apply 1 Application topically 2 (two) times daily., Disp: 45 g, Rfl: 0 [2]  Social History Tobacco Use  Smoking Status Never  Smokeless Tobacco Never  [3]  Allergies Allergen Reactions   Penicillins Cross Reactors Rash   "

## 2024-08-11 DIAGNOSIS — M722 Plantar fascial fibromatosis: Secondary | ICD-10-CM | POA: Diagnosis not present

## 2024-08-11 MED ORDER — TRIAMCINOLONE ACETONIDE 10 MG/ML IJ SUSP
5.0000 mg | Freq: Once | INTRAMUSCULAR | Status: AC
  Administered 2024-08-11: 5 mg via INTRAMUSCULAR

## 2024-09-05 ENCOUNTER — Encounter

## 2024-09-05 ENCOUNTER — Ambulatory Visit (HOSPITAL_COMMUNITY)
